# Patient Record
Sex: Male | Born: 1945
Health system: Southern US, Community
[De-identification: ages and names within clinical notes are randomized; demographics above are authoritative.]

## PROBLEM LIST (undated history)

## (undated) DIAGNOSIS — I10 Essential (primary) hypertension: Secondary | ICD-10-CM

## (undated) DIAGNOSIS — R011 Cardiac murmur, unspecified: Secondary | ICD-10-CM

## (undated) DIAGNOSIS — N052 Unspecified nephritic syndrome with diffuse membranous glomerulonephritis: Secondary | ICD-10-CM

## (undated) DIAGNOSIS — E669 Obesity, unspecified: Secondary | ICD-10-CM

## (undated) DIAGNOSIS — C801 Malignant (primary) neoplasm, unspecified: Secondary | ICD-10-CM

## (undated) DIAGNOSIS — Z8719 Personal history of other diseases of the digestive system: Secondary | ICD-10-CM

## (undated) DIAGNOSIS — C884 Extranodal marginal zone B-cell lymphoma of mucosa-associated lymphoid tissue [MALT-lymphoma]: Secondary | ICD-10-CM

## (undated) DIAGNOSIS — N189 Chronic kidney disease, unspecified: Secondary | ICD-10-CM

## (undated) DIAGNOSIS — G7000 Myasthenia gravis without (acute) exacerbation: Secondary | ICD-10-CM

## (undated) DIAGNOSIS — E785 Hyperlipidemia, unspecified: Secondary | ICD-10-CM

## (undated) DIAGNOSIS — Z87442 Personal history of urinary calculi: Secondary | ICD-10-CM

## (undated) HISTORY — DX: Chronic kidney disease, unspecified: N18.9

## (undated) HISTORY — DX: Cardiac murmur, unspecified: R01.1

## (undated) HISTORY — DX: Obesity, unspecified: E66.9

## (undated) HISTORY — DX: Myasthenia gravis without (acute) exacerbation: G70.00

## (undated) HISTORY — DX: Unspecified nephritic syndrome with diffuse membranous glomerulonephritis: N05.2

## (undated) HISTORY — DX: Hyperlipidemia, unspecified: E78.5

## (undated) HISTORY — DX: Extranodal marginal zone B-cell lymphoma of mucosa-associated lymphoid tissue (MALT-lymphoma): C88.4

## (undated) HISTORY — DX: Malignant (primary) neoplasm, unspecified: C80.1

## (undated) HISTORY — DX: Personal history of other diseases of the digestive system: Z87.19

## (undated) HISTORY — PX: LITHOTRIPSY: SUR834

## (undated) HISTORY — DX: Essential (primary) hypertension: I10

## (undated) HISTORY — PX: EYE SURGERY: SHX253

## (undated) HISTORY — PX: OTHER SURGICAL HISTORY: SHX169

---

## 1989-06-24 DIAGNOSIS — C801 Malignant (primary) neoplasm, unspecified: Secondary | ICD-10-CM

## 1989-06-24 HISTORY — DX: Malignant (primary) neoplasm, unspecified: C80.1

## 2004-03-29 ENCOUNTER — Ambulatory Visit: Payer: Self-pay | Admitting: Internal Medicine

## 2005-07-22 ENCOUNTER — Ambulatory Visit: Payer: Self-pay | Admitting: Urology

## 2005-11-22 ENCOUNTER — Emergency Department: Payer: Self-pay | Admitting: Emergency Medicine

## 2008-11-10 ENCOUNTER — Ambulatory Visit: Payer: Self-pay | Admitting: Urology

## 2009-08-16 ENCOUNTER — Ambulatory Visit: Payer: Self-pay | Admitting: Urology

## 2009-08-17 ENCOUNTER — Ambulatory Visit: Payer: Self-pay | Admitting: Urology

## 2009-12-05 ENCOUNTER — Ambulatory Visit: Payer: Self-pay

## 2013-01-27 ENCOUNTER — Ambulatory Visit: Payer: Self-pay | Admitting: Nephrology

## 2013-07-12 ENCOUNTER — Ambulatory Visit: Payer: Self-pay | Admitting: Nephrology

## 2013-07-12 LAB — URINALYSIS, COMPLETE
Bacteria: NONE SEEN
Bilirubin,UR: NEGATIVE
Blood: NEGATIVE
Glucose,UR: 50 mg/dL (ref 0–75)
Granular Cast: 1
Ketone: NEGATIVE
Leukocyte Esterase: NEGATIVE
Nitrite: NEGATIVE
Ph: 6 (ref 4.5–8.0)
Protein: 30
RBC,UR: 1 /HPF (ref 0–5)
Specific Gravity: 1.019 (ref 1.003–1.030)
Squamous Epithelial: <1
WBC UR: <1 /HPF (ref 0–5)

## 2013-07-12 LAB — COMPREHENSIVE METABOLIC PANEL
Albumin: 3.6 g/dL (ref 3.4–5.0)
Alkaline Phosphatase: 83 U/L
Anion Gap: 5 — ABNORMAL LOW (ref 7–16)
BUN: 20 mg/dL — ABNORMAL HIGH (ref 7–18)
Bilirubin,Total: 0.4 mg/dL (ref 0.2–1.0)
Calcium, Total: 9.2 mg/dL (ref 8.5–10.1)
Chloride: 105 mmol/L (ref 98–107)
Co2: 27 mmol/L (ref 21–32)
Creatinine: 1.98 mg/dL — ABNORMAL HIGH (ref 0.60–1.30)
EGFR (African American): 39 — ABNORMAL LOW
EGFR (Non-African Amer.): 33 — ABNORMAL LOW
Glucose: 100 mg/dL — ABNORMAL HIGH (ref 65–99)
Osmolality: 277 (ref 275–301)
Potassium: 4 mmol/L (ref 3.5–5.1)
SGOT(AST): 28 U/L (ref 15–37)
SGPT (ALT): 36 U/L (ref 12–78)
Sodium: 137 mmol/L (ref 136–145)
Total Protein: 7.6 g/dL (ref 6.4–8.2)

## 2013-07-12 LAB — CBC WITH DIFFERENTIAL/PLATELET
Basophil #: 0 10*3/uL (ref 0.0–0.1)
Basophil %: 0.7 %
Eosinophil #: 0.4 10*3/uL (ref 0.0–0.7)
Eosinophil %: 5.7 %
HCT: 47.3 % (ref 40.0–52.0)
HGB: 16.2 g/dL (ref 13.0–18.0)
Lymphocyte #: 1.3 10*3/uL (ref 1.0–3.6)
Lymphocyte %: 17.8 %
MCH: 31.6 pg (ref 26.0–34.0)
MCHC: 34.2 g/dL (ref 32.0–36.0)
MCV: 92 fL (ref 80–100)
Monocyte #: 0.6 x10 3/mm (ref 0.2–1.0)
Monocyte %: 9.1 %
Neutrophil #: 4.7 10*3/uL (ref 1.4–6.5)
Neutrophil %: 66.7 %
Platelet: 209 10*3/uL (ref 150–440)
RBC: 5.12 10*6/uL (ref 4.40–5.90)
RDW: 14.1 % (ref 11.5–14.5)
WBC: 7.1 10*3/uL (ref 3.8–10.6)

## 2013-07-12 LAB — PROTIME-INR
INR: 1
Prothrombin Time: 13 secs (ref 11.5–14.7)

## 2013-07-12 LAB — PROTEIN / CREATININE RATIO, URINE
Creatinine, Urine: 161 mg/dL — ABNORMAL HIGH (ref 30.0–125.0)
Protein, Random Urine: 86 mg/dL — ABNORMAL HIGH (ref 0–12)
Protein/Creat. Ratio: 534 mg/gCREAT — ABNORMAL HIGH (ref 0–200)

## 2013-07-12 LAB — APTT: Activated PTT: 38.3 secs — ABNORMAL HIGH (ref 23.6–35.9)

## 2013-07-21 ENCOUNTER — Observation Stay: Payer: Self-pay | Admitting: Nephrology

## 2013-07-21 LAB — CBC WITH DIFFERENTIAL/PLATELET
Basophil #: 0.1 10*3/uL (ref 0.0–0.1)
Basophil %: 0.7 %
Eosinophil #: 0.2 10*3/uL (ref 0.0–0.7)
Eosinophil %: 2.4 %
HCT: 45.9 % (ref 40.0–52.0)
HGB: 15.9 g/dL (ref 13.0–18.0)
Lymphocyte #: 1.5 10*3/uL (ref 1.0–3.6)
Lymphocyte %: 17.3 %
MCH: 31.8 pg (ref 26.0–34.0)
MCHC: 34.6 g/dL (ref 32.0–36.0)
MCV: 92 fL (ref 80–100)
Monocyte #: 0.9 x10 3/mm (ref 0.2–1.0)
Monocyte %: 9.9 %
Neutrophil #: 6 10*3/uL (ref 1.4–6.5)
Neutrophil %: 69.7 %
Platelet: 192 10*3/uL (ref 150–440)
RBC: 4.99 10*6/uL (ref 4.40–5.90)
RDW: 14.1 % (ref 11.5–14.5)
WBC: 8.6 10*3/uL (ref 3.8–10.6)

## 2013-07-21 LAB — COMPREHENSIVE METABOLIC PANEL
Albumin: 3.5 g/dL (ref 3.4–5.0)
Alkaline Phosphatase: 86 U/L
Anion Gap: 7 (ref 7–16)
BUN: 21 mg/dL — ABNORMAL HIGH (ref 7–18)
Bilirubin,Total: 0.3 mg/dL (ref 0.2–1.0)
Calcium, Total: 9 mg/dL (ref 8.5–10.1)
Chloride: 107 mmol/L (ref 98–107)
Co2: 24 mmol/L (ref 21–32)
Creatinine: 1.95 mg/dL — ABNORMAL HIGH (ref 0.60–1.30)
EGFR (African American): 40 — ABNORMAL LOW
EGFR (Non-African Amer.): 34 — ABNORMAL LOW
Glucose: 122 mg/dL — ABNORMAL HIGH (ref 65–99)
Osmolality: 280 (ref 275–301)
Potassium: 4 mmol/L (ref 3.5–5.1)
SGOT(AST): 27 U/L (ref 15–37)
SGPT (ALT): 36 U/L (ref 12–78)
Sodium: 138 mmol/L (ref 136–145)
Total Protein: 7 g/dL (ref 6.4–8.2)

## 2013-07-21 LAB — URINALYSIS, COMPLETE
Bacteria: NONE SEEN
Bilirubin,UR: NEGATIVE
Blood: NEGATIVE
Glucose,UR: 50 mg/dL (ref 0–75)
Ketone: NEGATIVE
Leukocyte Esterase: NEGATIVE
Nitrite: NEGATIVE
Ph: 6 (ref 4.5–8.0)
Protein: 30
RBC,UR: NONE SEEN /HPF (ref 0–5)
Specific Gravity: 1.014 (ref 1.003–1.030)
Squamous Epithelial: <1
WBC UR: <1 /HPF (ref 0–5)

## 2013-07-21 LAB — PROTEIN / CREATININE RATIO, URINE
Creatinine, Urine: 128.8 mg/dL — ABNORMAL HIGH (ref 30.0–125.0)
Protein, Random Urine: 59 mg/dL — ABNORMAL HIGH (ref 0–12)
Protein/Creat. Ratio: 458 mg/gCREAT — ABNORMAL HIGH (ref 0–200)

## 2013-07-21 LAB — PROTIME-INR
INR: 1.1
Prothrombin Time: 13.7 secs (ref 11.5–14.7)

## 2013-07-21 LAB — HEMOGLOBIN: HGB: 16.3 g/dL (ref 13.0–18.0)

## 2013-07-22 LAB — CBC WITH DIFFERENTIAL/PLATELET
Basophil #: 0 10*3/uL (ref 0.0–0.1)
Basophil %: 0.4 %
Eosinophil #: 0.2 10*3/uL (ref 0.0–0.7)
Eosinophil %: 2.4 %
HCT: 46.8 % (ref 40.0–52.0)
HGB: 15.9 g/dL (ref 13.0–18.0)
Lymphocyte #: 1.5 10*3/uL (ref 1.0–3.6)
Lymphocyte %: 17.1 %
MCH: 31.6 pg (ref 26.0–34.0)
MCHC: 34 g/dL (ref 32.0–36.0)
MCV: 93 fL (ref 80–100)
Monocyte #: 0.9 x10 3/mm (ref 0.2–1.0)
Monocyte %: 10.6 %
Neutrophil #: 6.2 10*3/uL (ref 1.4–6.5)
Neutrophil %: 69.5 %
Platelet: 196 10*3/uL (ref 150–440)
RBC: 5.03 10*6/uL (ref 4.40–5.90)
RDW: 13.9 % (ref 11.5–14.5)
WBC: 8.9 10*3/uL (ref 3.8–10.6)

## 2013-07-22 LAB — BASIC METABOLIC PANEL
Anion Gap: 7 (ref 7–16)
BUN: 24 mg/dL — ABNORMAL HIGH (ref 7–18)
Calcium, Total: 9.2 mg/dL (ref 8.5–10.1)
Chloride: 107 mmol/L (ref 98–107)
Co2: 22 mmol/L (ref 21–32)
Creatinine: 1.94 mg/dL — ABNORMAL HIGH (ref 0.60–1.30)
EGFR (African American): 40 — ABNORMAL LOW
EGFR (Non-African Amer.): 34 — ABNORMAL LOW
Glucose: 105 mg/dL — ABNORMAL HIGH (ref 65–99)
Osmolality: 276 (ref 275–301)
Potassium: 3.7 mmol/L (ref 3.5–5.1)
Sodium: 136 mmol/L (ref 136–145)

## 2013-08-03 ENCOUNTER — Ambulatory Visit: Payer: Self-pay | Admitting: Nephrology

## 2013-08-12 ENCOUNTER — Ambulatory Visit: Payer: Self-pay | Admitting: Internal Medicine

## 2013-08-12 LAB — HEPATIC FUNCTION PANEL A (ARMC)
Albumin: 3.8 g/dL (ref 3.4–5.0)
Alkaline Phosphatase: 77 U/L
Bilirubin, Direct: 0.1 mg/dL (ref 0.00–0.20)
Bilirubin,Total: 0.3 mg/dL (ref 0.2–1.0)
SGOT(AST): 23 U/L (ref 15–37)
SGPT (ALT): 40 U/L (ref 12–78)
Total Protein: 7.5 g/dL (ref 6.4–8.2)

## 2013-08-12 LAB — LACTATE DEHYDROGENASE: LDH: 204 U/L (ref 85–241)

## 2013-08-13 LAB — CANCER ANTIGEN 19-9: CA 19-9: 2 U/mL (ref 0–35)

## 2013-08-13 LAB — CEA: CEA: 2.6 ng/mL (ref 0.0–4.7)

## 2013-08-22 ENCOUNTER — Ambulatory Visit: Payer: Self-pay | Admitting: Internal Medicine

## 2013-09-22 ENCOUNTER — Ambulatory Visit: Payer: Self-pay | Admitting: Internal Medicine

## 2013-10-22 ENCOUNTER — Ambulatory Visit: Payer: Self-pay | Admitting: Internal Medicine

## 2013-10-29 ENCOUNTER — Ambulatory Visit: Payer: Self-pay | Admitting: Internal Medicine

## 2013-11-01 ENCOUNTER — Ambulatory Visit: Payer: Self-pay | Admitting: Internal Medicine

## 2013-11-01 LAB — CBC CANCER CENTER
Basophil #: 0.1 x10 3/mm (ref 0.0–0.1)
Basophil %: 0.9 %
Eosinophil #: 0.5 x10 3/mm (ref 0.0–0.7)
Eosinophil %: 6.2 %
HCT: 42.2 % (ref 40.0–52.0)
HGB: 14.3 g/dL (ref 13.0–18.0)
Lymphocyte #: 1.6 x10 3/mm (ref 1.0–3.6)
Lymphocyte %: 20.9 %
MCH: 31.5 pg (ref 26.0–34.0)
MCHC: 33.8 g/dL (ref 32.0–36.0)
MCV: 93 fL (ref 80–100)
Monocyte #: 0.8 x10 3/mm (ref 0.2–1.0)
Monocyte %: 10.2 %
Neutrophil #: 4.7 x10 3/mm (ref 1.4–6.5)
Neutrophil %: 61.8 %
Platelet: 184 x10 3/mm (ref 150–440)
RBC: 4.53 10*6/uL (ref 4.40–5.90)
RDW: 13.8 % (ref 11.5–14.5)
WBC: 7.7 x10 3/mm (ref 3.8–10.6)

## 2013-11-22 ENCOUNTER — Ambulatory Visit: Payer: Self-pay | Admitting: Internal Medicine

## 2014-01-11 ENCOUNTER — Ambulatory Visit: Payer: Self-pay | Admitting: Gastroenterology

## 2014-01-12 LAB — PATHOLOGY REPORT

## 2014-02-10 ENCOUNTER — Ambulatory Visit: Payer: Self-pay | Admitting: Internal Medicine

## 2014-06-24 DIAGNOSIS — Z8719 Personal history of other diseases of the digestive system: Secondary | ICD-10-CM

## 2014-06-24 HISTORY — DX: Personal history of other diseases of the digestive system: Z87.19

## 2014-07-05 ENCOUNTER — Emergency Department: Payer: Self-pay | Admitting: Emergency Medicine

## 2014-07-05 LAB — CBC WITH DIFFERENTIAL/PLATELET
Basophil #: 0.1 10*3/uL (ref 0.0–0.1)
Basophil %: 0.6 %
Eosinophil #: 0.3 10*3/uL (ref 0.0–0.7)
Eosinophil %: 2.8 %
HCT: 48.9 % (ref 40.0–52.0)
HGB: 16.1 g/dL (ref 13.0–18.0)
Lymphocyte #: 1.9 10*3/uL (ref 1.0–3.6)
Lymphocyte %: 17.3 %
MCH: 32.2 pg (ref 26.0–34.0)
MCHC: 32.9 g/dL (ref 32.0–36.0)
MCV: 98 fL (ref 80–100)
Monocyte #: 1.3 x10 3/mm — ABNORMAL HIGH (ref 0.2–1.0)
Monocyte %: 11.9 %
Neutrophil #: 7.2 10*3/uL — ABNORMAL HIGH (ref 1.4–6.5)
Neutrophil %: 67.4 %
Platelet: 201 10*3/uL (ref 150–440)
RBC: 4.99 10*6/uL (ref 4.40–5.90)
RDW: 13.3 % (ref 11.5–14.5)
WBC: 10.7 10*3/uL — ABNORMAL HIGH (ref 3.8–10.6)

## 2014-07-05 LAB — URINALYSIS, COMPLETE
Bacteria: NONE SEEN
Bilirubin,UR: NEGATIVE
Blood: NEGATIVE
Glucose,UR: 50 mg/dL (ref 0–75)
Ketone: NEGATIVE
Leukocyte Esterase: NEGATIVE
Nitrite: NEGATIVE
Ph: 5 (ref 4.5–8.0)
Protein: NEGATIVE
RBC,UR: NONE SEEN /HPF (ref 0–5)
Specific Gravity: 1.018 (ref 1.003–1.030)
Squamous Epithelial: NONE SEEN
WBC UR: 1 /HPF (ref 0–5)

## 2014-07-05 LAB — COMPREHENSIVE METABOLIC PANEL
Albumin: 3.5 g/dL (ref 3.4–5.0)
Alkaline Phosphatase: 66 U/L
Anion Gap: 9 (ref 7–16)
BUN: 26 mg/dL — ABNORMAL HIGH (ref 7–18)
Bilirubin,Total: 0.5 mg/dL (ref 0.2–1.0)
Calcium, Total: 9.1 mg/dL (ref 8.5–10.1)
Chloride: 107 mmol/L (ref 98–107)
Co2: 25 mmol/L (ref 21–32)
Creatinine: 2.15 mg/dL — ABNORMAL HIGH (ref 0.60–1.30)
EGFR (African American): 39 — ABNORMAL LOW
EGFR (Non-African Amer.): 32 — ABNORMAL LOW
Glucose: 112 mg/dL — ABNORMAL HIGH (ref 65–99)
Osmolality: 287 (ref 275–301)
Potassium: 3.9 mmol/L (ref 3.5–5.1)
SGOT(AST): 22 U/L (ref 15–37)
SGPT (ALT): 35 U/L
Sodium: 141 mmol/L (ref 136–145)
Total Protein: 6.9 g/dL (ref 6.4–8.2)

## 2014-08-19 ENCOUNTER — Ambulatory Visit: Payer: Self-pay | Admitting: Family Medicine

## 2014-12-06 ENCOUNTER — Telehealth: Payer: Self-pay | Admitting: Family Medicine

## 2014-12-06 MED ORDER — SCOPOLAMINE 1 MG/3DAYS TD PT72: 2.0000 | MEDICATED_PATCH | TRANSDERMAL | Status: DC

## 2014-12-06 NOTE — Telephone Encounter (Signed)
Pt called stated he is leaving Friday for a cruise to [place]. Wants to know if Dr. Jeananne Rama can call in 2 motion sickness patches that go behind the ear. Please call pt with any issues. 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

## 2015-05-11 DIAGNOSIS — I10 Essential (primary) hypertension: Secondary | ICD-10-CM | POA: Insufficient documentation

## 2015-05-11 DIAGNOSIS — E785 Hyperlipidemia, unspecified: Secondary | ICD-10-CM | POA: Insufficient documentation

## 2015-05-15 ENCOUNTER — Encounter: Payer: Self-pay | Admitting: Family Medicine

## 2015-05-15 ENCOUNTER — Ambulatory Visit (INDEPENDENT_AMBULATORY_CARE_PROVIDER_SITE_OTHER): Payer: PPO | Admitting: Family Medicine

## 2015-05-15 VITALS — BP 131/83 | HR 88 | Temp 98.1°F | Ht 68.2 in | Wt 218.0 lb

## 2015-05-15 DIAGNOSIS — Z0000 Encounter for general adult medical examination without abnormal findings: Secondary | ICD-10-CM

## 2015-05-15 DIAGNOSIS — E039 Hypothyroidism, unspecified: Secondary | ICD-10-CM | POA: Diagnosis not present

## 2015-05-15 DIAGNOSIS — N052 Unspecified nephritic syndrome with diffuse membranous glomerulonephritis: Secondary | ICD-10-CM | POA: Diagnosis not present

## 2015-05-15 DIAGNOSIS — I10 Essential (primary) hypertension: Secondary | ICD-10-CM

## 2015-05-15 DIAGNOSIS — G7000 Myasthenia gravis without (acute) exacerbation: Secondary | ICD-10-CM

## 2015-05-15 DIAGNOSIS — E785 Hyperlipidemia, unspecified: Secondary | ICD-10-CM | POA: Diagnosis not present

## 2015-05-15 DIAGNOSIS — N184 Chronic kidney disease, stage 4 (severe): Secondary | ICD-10-CM | POA: Diagnosis not present

## 2015-05-15 MED ORDER — OMEPRAZOLE MAGNESIUM 20 MG PO TBEC: 20.0000 mg | DELAYED_RELEASE_TABLET | Freq: Every day | ORAL | Status: DC

## 2015-05-15 MED ORDER — ATORVASTATIN CALCIUM 20 MG PO TABS: 20.0000 mg | ORAL_TABLET | Freq: Every day | ORAL | Status: DC

## 2015-05-15 MED ORDER — BENAZEPRIL HCL 40 MG PO TABS: 40.0000 mg | ORAL_TABLET | Freq: Every day | ORAL | Status: DC

## 2015-05-15 MED ORDER — LEVOTHYROXINE SODIUM 175 MCG PO TABS: 175.0000 ug | ORAL_TABLET | Freq: Every day | ORAL | Status: DC

## 2015-05-15 NOTE — Assessment & Plan Note (Signed)
Cared for at the [facility]

## 2015-05-15 NOTE — Assessment & Plan Note (Signed)
The current medical regimen is effective;  continue present plan and medications.  

## 2015-05-15 NOTE — Progress Notes (Signed)
BP 131/83 mmHg  Pulse 88  Temp(Src) 98.1 F (36.7 C)  Ht 5' 8.2" (1.732 m)  Wt 218 lb (98.884 kg)  BMI 32.96 kg/m2   Subjective:    Patient ID: Bill Aguilar, male    DOB: 02-14-1946, 69 y.o.   MRN: [id number]  HPI: Bill Aguilar is a 69 y.o. male  Chief Complaint  Patient presents with  . Annual Exam   4 annual wellness visit also follow-up blood pressure and renal protection from benazepril takes faithfully without problems or side effects Takes Lipitor without problems or side effects takes faithfully Thyroid doing well with no concerns takes medicines faithfully  myasthenia stable on Mestinon Takes prednisone for glomerulonephritis and stable following with nephrology on a regular basis  Patient being chronic prednisone usage concerned about osteoporosis Will check bone density   Relevant past medical, surgical, family and social history reviewed and updated as indicated. Interim medical history since our last visit reviewed. Allergies and medications reviewed and updated.  Review of Systems  Constitutional: Negative.   HENT: Negative.   Eyes: Negative.   Respiratory: Negative.   Cardiovascular: Negative.   Gastrointestinal: Negative.   Endocrine: Negative.   Genitourinary: Negative.   Musculoskeletal: Negative.   Skin: Negative.   Allergic/Immunologic: Negative.   Neurological: Negative.   Hematological: Negative.   Psychiatric/Behavioral: Negative.     Per HPI unless specifically indicated above     Objective:    BP 131/83 mmHg  Pulse 88  Temp(Src) 98.1 F (36.7 C)  Ht 5' 8.2" (1.732 m)  Wt 218 lb (98.884 kg)  BMI 32.96 kg/m2  Wt Readings from Last 3 Encounters:  05/15/15 218 lb (98.884 kg)  10/26/14 221 lb (100.245 kg)    Physical Exam  Constitutional: He is oriented to person, place, and time. He appears well-developed and well-nourished.  HENT:  Head: Normocephalic and atraumatic.  Right Ear: External ear normal.  Left  Ear: External ear normal.  Eyes: Conjunctivae and EOM are normal. Pupils are equal, round, and reactive to light.  Neck: Normal range of motion. Neck supple.  Cardiovascular: Normal rate, regular rhythm, normal heart sounds and intact distal pulses.   Pulmonary/Chest: Effort normal and breath sounds normal.  Abdominal: Soft. Bowel sounds are normal. There is no splenomegaly or hepatomegaly.  Genitourinary: Rectum normal, prostate normal and penis normal.  Musculoskeletal: Normal range of motion.  Neurological: He is alert and oriented to person, place, and time. He has normal reflexes.  Skin: No rash noted. No erythema.  Psychiatric: He has a normal mood and affect. His behavior is normal. Judgment and thought content normal.    Results for orders placed or performed in visit on 07/05/14  CBC with Differential/Platelet  Result Value Ref Range   WBC 10.7 (H) 3.8-10.6 x10 3/mm 3   RBC 4.99 4.40-5.90 x10 6/mm 3   HGB 16.1 13.0-18.0 g/dL   HCT 48.9 40.0-52.0 %   MCV 98 80-100 fL   MCH 32.2 26.0-34.0 pg   MCHC 32.9 32.0-36.0 g/dL   RDW 13.3 11.5-14.5 %   Platelet 201 150-440 x10 3/mm 3   Neutrophil % 67.4 %   Lymphocyte % 17.3 %   Monocyte % 11.9 %   Eosinophil % 2.8 %   Basophil % 0.6 %   Neutrophil # 7.2 (H) 1.4-6.5 x10 3/mm 3   Lymphocyte # 1.9 1.0-3.6 x10 3/mm 3   Monocyte # 1.3 (H) 0.2-1.0 x10 3/mm    Eosinophil # 0.3 0.0-0.7 x10  3/mm 3   Basophil # 0.1 0.0-0.1 x10 3/mm 3  Comprehensive metabolic panel  Result Value Ref Range   Glucose 112 (H) 65-99 mg/dL   BUN 26 (H) 7-18 mg/dL   Creatinine 2.15 (H) 0.60-1.30 mg/dL   Sodium 141 136-145 mmol/L   Potassium 3.9 3.5-5.1 mmol/L   Chloride 107 98-107 mmol/L   Co2 25 21-32 mmol/L   Calcium, Total 9.1 8.5-10.1 mg/dL   SGOT(AST) 22 15-37 Unit/L   SGPT (ALT) 35 U/L   Alkaline Phosphatase 66 Unit/L   Albumin 3.5 3.4-5.0 g/dL   Total Protein 6.9 6.4-8.2 g/dL   Bilirubin,Total 0.5 0.2-1.0 mg/dL   Osmolality 287 275-301   Anion  Gap 9 7-16   EGFR (African American) 39 (L) >6m/min   EGFR (Non-African Amer.) 32 (L) >673mmin  Urinalysis, Complete  Result Value Ref Range   Color - urine Yellow    Clarity - urine Clear    Glucose,UR 50 mg/dL 0-75 mg/dL   Bilirubin,UR Negative NEGATIVE   Ketone Negative NEGATIVE   Specific Gravity 1.018 1.003-1.030   Blood Negative NEGATIVE   Ph 5.0 4.5-8.0   Protein Negative NEGATIVE   Nitrite Negative NEGATIVE   Leukocyte Esterase Negative NEGATIVE   RBC,UR NONE SEEN 0-5 /HPF   WBC UR 1 /HPF 0-5 /HPF   Bacteria NONE SEEN NONE SEEN   Squamous Epithelial NONE SEEN    Mucous PRESENT       Assessment & Plan:   Problem List Items Addressed This Visit      Cardiovascular and Mediastinum   Essential hypertension    The current medical regimen is effective;  continue present plan and medications.       Relevant Medications   benazepril (LOTENSIN) 40 MG tablet   atorvastatin (LIPITOR) 20 MG tablet   Other Relevant Orders   Comprehensive metabolic panel   PSA     Endocrine   Hypothyroidism    The current medical regimen is effective;  continue present plan and medications.       Relevant Medications   levothyroxine (SYNTHROID, LEVOTHROID) 175 MCG tablet   Other Relevant Orders   TSH     Musculoskeletal and Integument   Myasthenia gravis (HCChamizal   [facility]    Relevant Orders   Comprehensive metabolic panel   PSA     Genitourinary   Membranous glomerulonephritis, stage 4   Relevant Orders   PSA   TSH   Membranous glomerulonephritis - Primary    Followed by nephrology      Chronic kidney disease   Relevant Orders   DG Bone Density   Comprehensive metabolic panel   CBC with Differential/Platelet   PSA     Other   Hyperlipidemia    The current medical regimen is effective;  continue present plan and medications.       Relevant Medications   benazepril (LOTENSIN) 40 MG tablet   atorvastatin (LIPITOR) 20 MG tablet    Other Relevant Orders   Lipid panel    Other Visit Diagnoses    PE (physical exam), annual            Follow up plan: Return in about 6 months (around 11/12/2015) for lipids, alt,ast.

## 2015-05-15 NOTE — Assessment & Plan Note (Signed)
Followed by nephrology. 

## 2015-05-16 ENCOUNTER — Encounter: Payer: Self-pay | Admitting: Family Medicine

## 2015-05-16 LAB — LIPID PANEL
Chol/HDL Ratio: 4.2 ratio units (ref 0.0–5.0)
Cholesterol, Total: 197 mg/dL (ref 100–199)
HDL: 47 mg/dL (ref >39)
LDL Calculated: 96 mg/dL (ref 0–99)
Triglycerides: 269 mg/dL — ABNORMAL HIGH (ref 0–149)
VLDL Cholesterol Cal: 54 mg/dL — ABNORMAL HIGH (ref 5–40)

## 2015-05-16 LAB — CBC WITH DIFFERENTIAL/PLATELET
Basophils Absolute: 0 10*3/uL (ref 0.0–0.2)
Basos: 0 %
EOS (ABSOLUTE): 0.2 10*3/uL (ref 0.0–0.4)
Eos: 3 %
Hematocrit: 45.9 % (ref 37.5–51.0)
Hemoglobin: 16 g/dL (ref 12.6–17.7)
Immature Grans (Abs): 0.2 10*3/uL — ABNORMAL HIGH (ref 0.0–0.1)
Immature Granulocytes: 3 %
Lymphocytes Absolute: 1.6 10*3/uL (ref 0.7–3.1)
Lymphs: 21 %
MCH: 33.1 pg — ABNORMAL HIGH (ref 26.6–33.0)
MCHC: 34.9 g/dL (ref 31.5–35.7)
MCV: 95 fL (ref 79–97)
Monocytes Absolute: 1.1 10*3/uL — ABNORMAL HIGH (ref 0.1–0.9)
Monocytes: 14 %
Neutrophils Absolute: 4.5 10*3/uL (ref 1.4–7.0)
Neutrophils: 59 %
Platelets: 188 10*3/uL (ref 150–379)
RBC: 4.84 x10E6/uL (ref 4.14–5.80)
RDW: 14 % (ref 12.3–15.4)
WBC: 7.7 10*3/uL (ref 3.4–10.8)

## 2015-05-16 LAB — COMPREHENSIVE METABOLIC PANEL
ALT: 31 IU/L (ref 0–44)
AST: 21 IU/L (ref 0–40)
Albumin/Globulin Ratio: 1.8 (ref 1.1–2.5)
Albumin: 4.2 g/dL (ref 3.5–4.8)
Alkaline Phosphatase: 75 IU/L (ref 39–117)
BUN/Creatinine Ratio: 13 (ref 10–22)
BUN: 25 mg/dL (ref 8–27)
Bilirubin Total: 0.3 mg/dL (ref 0.0–1.2)
CO2: 25 mmol/L (ref 18–29)
Calcium: 9.6 mg/dL (ref 8.6–10.2)
Chloride: 101 mmol/L (ref 97–106)
Creatinine, Ser: 1.95 mg/dL — ABNORMAL HIGH (ref 0.76–1.27)
GFR calc Af Amer: 39 mL/min/{1.73_m2} — ABNORMAL LOW (ref >59)
GFR calc non Af Amer: 34 mL/min/{1.73_m2} — ABNORMAL LOW (ref >59)
Globulin, Total: 2.4 g/dL (ref 1.5–4.5)
Glucose: 108 mg/dL — ABNORMAL HIGH (ref 65–99)
Potassium: 4.6 mmol/L (ref 3.5–5.2)
Sodium: 141 mmol/L (ref 136–144)
Total Protein: 6.6 g/dL (ref 6.0–8.5)

## 2015-05-16 LAB — PSA: Prostate Specific Ag, Serum: 3.4 ng/mL (ref 0.0–4.0)

## 2015-05-16 LAB — TSH: TSH: 3.14 u[IU]/mL (ref 0.450–4.500)

## 2015-07-21 DIAGNOSIS — I10 Essential (primary) hypertension: Secondary | ICD-10-CM | POA: Diagnosis not present

## 2015-07-21 DIAGNOSIS — N049 Nephrotic syndrome with unspecified morphologic changes: Secondary | ICD-10-CM | POA: Diagnosis not present

## 2015-07-21 DIAGNOSIS — R809 Proteinuria, unspecified: Secondary | ICD-10-CM | POA: Diagnosis not present

## 2015-07-21 DIAGNOSIS — N050 Unspecified nephritic syndrome with minor glomerular abnormality: Secondary | ICD-10-CM | POA: Diagnosis not present

## 2015-07-21 DIAGNOSIS — N183 Chronic kidney disease, stage 3 (moderate): Secondary | ICD-10-CM | POA: Diagnosis not present

## 2015-07-21 DIAGNOSIS — Z131 Encounter for screening for diabetes mellitus: Secondary | ICD-10-CM | POA: Diagnosis not present

## 2015-07-21 DIAGNOSIS — N052 Unspecified nephritic syndrome with diffuse membranous glomerulonephritis: Secondary | ICD-10-CM | POA: Diagnosis not present

## 2015-07-21 DIAGNOSIS — E785 Hyperlipidemia, unspecified: Secondary | ICD-10-CM | POA: Diagnosis not present

## 2015-07-24 DIAGNOSIS — R809 Proteinuria, unspecified: Secondary | ICD-10-CM | POA: Diagnosis not present

## 2015-07-24 DIAGNOSIS — N052 Unspecified nephritic syndrome with diffuse membranous glomerulonephritis: Secondary | ICD-10-CM | POA: Diagnosis not present

## 2015-07-24 DIAGNOSIS — N183 Chronic kidney disease, stage 3 (moderate): Secondary | ICD-10-CM | POA: Diagnosis not present

## 2015-07-24 DIAGNOSIS — I129 Hypertensive chronic kidney disease with stage 1 through stage 4 chronic kidney disease, or unspecified chronic kidney disease: Secondary | ICD-10-CM | POA: Diagnosis not present

## 2015-09-08 DIAGNOSIS — N183 Chronic kidney disease, stage 3 (moderate): Secondary | ICD-10-CM | POA: Diagnosis not present

## 2015-09-08 DIAGNOSIS — N052 Unspecified nephritic syndrome with diffuse membranous glomerulonephritis: Secondary | ICD-10-CM | POA: Diagnosis not present

## 2015-09-08 DIAGNOSIS — N050 Unspecified nephritic syndrome with minor glomerular abnormality: Secondary | ICD-10-CM | POA: Diagnosis not present

## 2015-09-08 DIAGNOSIS — N049 Nephrotic syndrome with unspecified morphologic changes: Secondary | ICD-10-CM | POA: Diagnosis not present

## 2015-09-08 DIAGNOSIS — R809 Proteinuria, unspecified: Secondary | ICD-10-CM | POA: Diagnosis not present

## 2015-09-08 DIAGNOSIS — E785 Hyperlipidemia, unspecified: Secondary | ICD-10-CM | POA: Diagnosis not present

## 2015-09-08 DIAGNOSIS — I10 Essential (primary) hypertension: Secondary | ICD-10-CM | POA: Diagnosis not present

## 2015-09-11 DIAGNOSIS — N052 Unspecified nephritic syndrome with diffuse membranous glomerulonephritis: Secondary | ICD-10-CM | POA: Diagnosis not present

## 2015-09-11 DIAGNOSIS — R809 Proteinuria, unspecified: Secondary | ICD-10-CM | POA: Diagnosis not present

## 2015-09-11 DIAGNOSIS — I10 Essential (primary) hypertension: Secondary | ICD-10-CM | POA: Diagnosis not present

## 2015-09-11 DIAGNOSIS — N184 Chronic kidney disease, stage 4 (severe): Secondary | ICD-10-CM | POA: Diagnosis not present

## 2015-10-03 DIAGNOSIS — N052 Unspecified nephritic syndrome with diffuse membranous glomerulonephritis: Secondary | ICD-10-CM | POA: Diagnosis not present

## 2015-10-03 DIAGNOSIS — N183 Chronic kidney disease, stage 3 (moderate): Secondary | ICD-10-CM | POA: Diagnosis not present

## 2015-10-03 DIAGNOSIS — N049 Nephrotic syndrome with unspecified morphologic changes: Secondary | ICD-10-CM | POA: Diagnosis not present

## 2015-10-03 DIAGNOSIS — R809 Proteinuria, unspecified: Secondary | ICD-10-CM | POA: Diagnosis not present

## 2015-10-03 DIAGNOSIS — I10 Essential (primary) hypertension: Secondary | ICD-10-CM | POA: Diagnosis not present

## 2015-10-03 DIAGNOSIS — N050 Unspecified nephritic syndrome with minor glomerular abnormality: Secondary | ICD-10-CM | POA: Diagnosis not present

## 2015-10-03 DIAGNOSIS — E785 Hyperlipidemia, unspecified: Secondary | ICD-10-CM | POA: Diagnosis not present

## 2015-10-05 DIAGNOSIS — R809 Proteinuria, unspecified: Secondary | ICD-10-CM | POA: Diagnosis not present

## 2015-10-05 DIAGNOSIS — N052 Unspecified nephritic syndrome with diffuse membranous glomerulonephritis: Secondary | ICD-10-CM | POA: Diagnosis not present

## 2015-10-05 DIAGNOSIS — N183 Chronic kidney disease, stage 3 (moderate): Secondary | ICD-10-CM | POA: Diagnosis not present

## 2015-10-05 DIAGNOSIS — I10 Essential (primary) hypertension: Secondary | ICD-10-CM | POA: Diagnosis not present

## 2015-10-05 DIAGNOSIS — R319 Hematuria, unspecified: Secondary | ICD-10-CM | POA: Diagnosis not present

## 2015-10-07 DIAGNOSIS — Z7952 Long term (current) use of systemic steroids: Secondary | ICD-10-CM | POA: Diagnosis not present

## 2015-10-13 DIAGNOSIS — I10 Essential (primary) hypertension: Secondary | ICD-10-CM | POA: Diagnosis not present

## 2015-10-13 DIAGNOSIS — R809 Proteinuria, unspecified: Secondary | ICD-10-CM | POA: Diagnosis not present

## 2015-10-13 DIAGNOSIS — N052 Unspecified nephritic syndrome with diffuse membranous glomerulonephritis: Secondary | ICD-10-CM | POA: Diagnosis not present

## 2015-10-13 DIAGNOSIS — R319 Hematuria, unspecified: Secondary | ICD-10-CM | POA: Diagnosis not present

## 2015-10-13 DIAGNOSIS — N183 Chronic kidney disease, stage 3 (moderate): Secondary | ICD-10-CM | POA: Diagnosis not present

## 2015-10-23 DIAGNOSIS — E785 Hyperlipidemia, unspecified: Secondary | ICD-10-CM | POA: Diagnosis not present

## 2015-10-23 DIAGNOSIS — N052 Unspecified nephritic syndrome with diffuse membranous glomerulonephritis: Secondary | ICD-10-CM | POA: Diagnosis not present

## 2015-10-23 DIAGNOSIS — R809 Proteinuria, unspecified: Secondary | ICD-10-CM | POA: Diagnosis not present

## 2015-10-23 DIAGNOSIS — I10 Essential (primary) hypertension: Secondary | ICD-10-CM | POA: Diagnosis not present

## 2015-10-23 DIAGNOSIS — N183 Chronic kidney disease, stage 3 (moderate): Secondary | ICD-10-CM | POA: Diagnosis not present

## 2015-10-23 DIAGNOSIS — N049 Nephrotic syndrome with unspecified morphologic changes: Secondary | ICD-10-CM | POA: Diagnosis not present

## 2015-10-23 DIAGNOSIS — N050 Unspecified nephritic syndrome with minor glomerular abnormality: Secondary | ICD-10-CM | POA: Diagnosis not present

## 2015-10-25 DIAGNOSIS — N183 Chronic kidney disease, stage 3 (moderate): Secondary | ICD-10-CM | POA: Diagnosis not present

## 2015-10-25 DIAGNOSIS — E785 Hyperlipidemia, unspecified: Secondary | ICD-10-CM | POA: Diagnosis not present

## 2015-10-25 DIAGNOSIS — R809 Proteinuria, unspecified: Secondary | ICD-10-CM | POA: Diagnosis not present

## 2015-10-25 DIAGNOSIS — N052 Unspecified nephritic syndrome with diffuse membranous glomerulonephritis: Secondary | ICD-10-CM | POA: Diagnosis not present

## 2015-10-25 DIAGNOSIS — I10 Essential (primary) hypertension: Secondary | ICD-10-CM | POA: Diagnosis not present

## 2015-11-13 ENCOUNTER — Encounter: Payer: Self-pay | Admitting: Family Medicine

## 2015-11-13 ENCOUNTER — Ambulatory Visit (INDEPENDENT_AMBULATORY_CARE_PROVIDER_SITE_OTHER): Payer: PPO | Admitting: Family Medicine

## 2015-11-13 VITALS — BP 96/63 | HR 72 | Temp 97.9°F | Ht 68.5 in | Wt 207.0 lb

## 2015-11-13 DIAGNOSIS — E785 Hyperlipidemia, unspecified: Secondary | ICD-10-CM | POA: Diagnosis not present

## 2015-11-13 DIAGNOSIS — I10 Essential (primary) hypertension: Secondary | ICD-10-CM | POA: Diagnosis not present

## 2015-11-13 LAB — LP+ALT+AST PICCOLO, WAIVED
ALT (SGPT) Piccolo, Waived: 32 U/L (ref 10–47)
AST (SGOT) Piccolo, Waived: 35 U/L (ref 11–38)
Chol/HDL Ratio Piccolo,Waive: 4.6 mg/dL
Cholesterol Piccolo, Waived: 182 mg/dL (ref <200)
HDL Chol Piccolo, Waived: 39 mg/dL — ABNORMAL LOW (ref >59)
LDL Chol Calc Piccolo Waived: 71 mg/dL (ref <100)
Triglycerides Piccolo,Waived: 355 mg/dL — ABNORMAL HIGH (ref <150)
VLDL Chol Calc Piccolo,Waive: 71 mg/dL — ABNORMAL HIGH (ref <30)

## 2015-11-13 NOTE — Assessment & Plan Note (Signed)
The current medical regimen is effective;  continue present plan and medications.  

## 2015-11-13 NOTE — Progress Notes (Signed)
BP 96/63 mmHg  Pulse 72  Temp(Src) 97.9 F (36.6 C)  Ht 5' 8.5" (1.74 m)  Wt 207 lb (93.895 kg)  BMI 31.01 kg/m2  SpO2 99%   Subjective:    Patient ID: Bill Aguilar, male    DOB: 1945/10/19, 70 y.o.   MRN: [id number]  HPI: Bill Aguilar is a 70 y.o. male  Chief Complaint  Patient presents with  . Hyperlipidemia  Patient recheck doing well with nephrology is on this new protocol for glomerular nephritis which seems to be improving. Patient also been working hard on weight loss and is about to goal of 200 pounds. Blood pressure remains low and remains on benazepril for renal protection and blood pressure control. No cholesterol issues no complaints from medications doing well.  Relevant past medical, surgical, family and social history reviewed and updated as indicated. Interim medical history since our last visit reviewed. Allergies and medications reviewed and updated.  Review of Systems  Constitutional: Negative.   Respiratory: Negative.   Cardiovascular: Negative.     Per HPI unless specifically indicated above     Objective:    BP 96/63 mmHg  Pulse 72  Temp(Src) 97.9 F (36.6 C)  Ht 5' 8.5" (1.74 m)  Wt 207 lb (93.895 kg)  BMI 31.01 kg/m2  SpO2 99%  Wt Readings from Last 3 Encounters:  11/13/15 207 lb (93.895 kg)  05/15/15 218 lb (98.884 kg)  10/26/14 221 lb (100.245 kg)    Physical Exam  Constitutional: He is oriented to person, place, and time. He appears well-developed and well-nourished. No distress.  HENT:  Head: Normocephalic and atraumatic.  Right Ear: Hearing normal.  Left Ear: Hearing normal.  Nose: Nose normal.  Eyes: Conjunctivae and lids are normal. Right eye exhibits no discharge. Left eye exhibits no discharge. No scleral icterus.  Cardiovascular: Normal rate, regular rhythm and normal heart sounds.   Pulmonary/Chest: Effort normal and breath sounds normal. No respiratory distress.  Musculoskeletal: Normal range of  motion.  Neurological: He is alert and oriented to person, place, and time.  Skin: Skin is intact. No rash noted.  Psychiatric: He has a normal mood and affect. His speech is normal and behavior is normal. Judgment and thought content normal. Cognition and memory are normal.    Results for orders placed or performed in visit on 05/15/15  Comprehensive metabolic panel  Result Value Ref Range   Glucose 108 (H) 65 - 99 mg/dL   BUN 25 8 - 27 mg/dL   Creatinine, Ser 1.95 (H) 0.76 - 1.27 mg/dL   GFR calc non Af Amer 34 (L) >59 mL/min/1.73   GFR calc Af Amer 39 (L) >59 mL/min/1.73   BUN/Creatinine Ratio 13 10 - 22   Sodium 141 136 - 144 mmol/L   Potassium 4.6 3.5 - 5.2 mmol/L   Chloride 101 97 - 106 mmol/L   CO2 25 18 - 29 mmol/L   Calcium 9.6 8.6 - 10.2 mg/dL   Total Protein 6.6 6.0 - 8.5 g/dL   Albumin 4.2 3.5 - 4.8 g/dL   Globulin, Total 2.4 1.5 - 4.5 g/dL   Albumin/Globulin Ratio 1.8 1.1 - 2.5   Bilirubin Total 0.3 0.0 - 1.2 mg/dL   Alkaline Phosphatase 75 39 - 117 IU/L   AST 21 0 - 40 IU/L   ALT 31 0 - 44 IU/L  Lipid panel  Result Value Ref Range   Cholesterol, Total 197 100 - 199 mg/dL   Triglycerides 269 (H) 0 -  149 mg/dL   HDL 47 >39 mg/dL   VLDL Cholesterol Cal 54 (H) 5 - 40 mg/dL   LDL Calculated 96 0 - 99 mg/dL   Chol/HDL Ratio 4.2 0.0 - 5.0 ratio units  CBC with Differential/Platelet  Result Value Ref Range   WBC 7.7 3.4 - 10.8 x10E3/uL   RBC 4.84 4.14 - 5.80 x10E6/uL   Hemoglobin 16.0 12.6 - 17.7 g/dL   Hematocrit 45.9 37.5 - 51.0 %   MCV 95 79 - 97 fL   MCH 33.1 (H) 26.6 - 33.0 pg   MCHC 34.9 31.5 - 35.7 g/dL   RDW 14.0 12.3 - 15.4 %   Platelets 188 150 - 379 x10E3/uL   Neutrophils 59 %   Lymphs 21 %   Monocytes 14 %   Eos 3 %   Basos 0 %   Neutrophils Absolute 4.5 1.4 - 7.0 x10E3/uL   Lymphocytes Absolute 1.6 0.7 - 3.1 x10E3/uL   Monocytes Absolute 1.1 (H) 0.1 - 0.9 x10E3/uL   EOS (ABSOLUTE) 0.2 0.0 - 0.4 x10E3/uL   Basophils Absolute 0.0 0.0 - 0.2  x10E3/uL   Immature Granulocytes 3 %   Immature Grans (Abs) 0.2 (H) 0.0 - 0.1 x10E3/uL  PSA  Result Value Ref Range   Prostate Specific Ag, Serum 3.4 0.0 - 4.0 ng/mL  TSH  Result Value Ref Range   TSH 3.140 0.450 - 4.500 uIU/mL      Assessment & Plan:   Problem List Items Addressed This Visit      Cardiovascular and Mediastinum   Essential hypertension    The current medical regimen is effective;  continue present plan and medications.         Other   Hyperlipidemia - Primary    The current medical regimen is effective;  continue present plan and medications.       Relevant Orders   LP+ALT+AST Piccolo, Waived       Follow up plan: Return in about 6 months (around 05/15/2016) for Physical Exam.

## 2015-11-23 DIAGNOSIS — L239 Allergic contact dermatitis, unspecified cause: Secondary | ICD-10-CM | POA: Diagnosis not present

## 2015-11-24 DIAGNOSIS — E785 Hyperlipidemia, unspecified: Secondary | ICD-10-CM | POA: Diagnosis not present

## 2015-11-24 DIAGNOSIS — I10 Essential (primary) hypertension: Secondary | ICD-10-CM | POA: Diagnosis not present

## 2015-11-24 DIAGNOSIS — N052 Unspecified nephritic syndrome with diffuse membranous glomerulonephritis: Secondary | ICD-10-CM | POA: Diagnosis not present

## 2015-11-24 DIAGNOSIS — N050 Unspecified nephritic syndrome with minor glomerular abnormality: Secondary | ICD-10-CM | POA: Diagnosis not present

## 2015-11-24 DIAGNOSIS — N183 Chronic kidney disease, stage 3 (moderate): Secondary | ICD-10-CM | POA: Diagnosis not present

## 2015-11-24 DIAGNOSIS — R809 Proteinuria, unspecified: Secondary | ICD-10-CM | POA: Diagnosis not present

## 2015-11-24 DIAGNOSIS — N049 Nephrotic syndrome with unspecified morphologic changes: Secondary | ICD-10-CM | POA: Diagnosis not present

## 2015-11-27 DIAGNOSIS — E785 Hyperlipidemia, unspecified: Secondary | ICD-10-CM | POA: Diagnosis not present

## 2015-11-27 DIAGNOSIS — N184 Chronic kidney disease, stage 4 (severe): Secondary | ICD-10-CM | POA: Diagnosis not present

## 2015-11-27 DIAGNOSIS — I10 Essential (primary) hypertension: Secondary | ICD-10-CM | POA: Diagnosis not present

## 2015-11-27 DIAGNOSIS — N052 Unspecified nephritic syndrome with diffuse membranous glomerulonephritis: Secondary | ICD-10-CM | POA: Diagnosis not present

## 2015-11-27 DIAGNOSIS — R809 Proteinuria, unspecified: Secondary | ICD-10-CM | POA: Diagnosis not present

## 2015-12-27 DIAGNOSIS — I10 Essential (primary) hypertension: Secondary | ICD-10-CM | POA: Diagnosis not present

## 2015-12-27 DIAGNOSIS — N049 Nephrotic syndrome with unspecified morphologic changes: Secondary | ICD-10-CM | POA: Diagnosis not present

## 2015-12-27 DIAGNOSIS — R809 Proteinuria, unspecified: Secondary | ICD-10-CM | POA: Diagnosis not present

## 2015-12-27 DIAGNOSIS — E785 Hyperlipidemia, unspecified: Secondary | ICD-10-CM | POA: Diagnosis not present

## 2015-12-27 DIAGNOSIS — N050 Unspecified nephritic syndrome with minor glomerular abnormality: Secondary | ICD-10-CM | POA: Diagnosis not present

## 2015-12-27 DIAGNOSIS — N052 Unspecified nephritic syndrome with diffuse membranous glomerulonephritis: Secondary | ICD-10-CM | POA: Diagnosis not present

## 2015-12-27 DIAGNOSIS — N183 Chronic kidney disease, stage 3 (moderate): Secondary | ICD-10-CM | POA: Diagnosis not present

## 2016-01-01 DIAGNOSIS — N184 Chronic kidney disease, stage 4 (severe): Secondary | ICD-10-CM | POA: Diagnosis not present

## 2016-01-01 DIAGNOSIS — N052 Unspecified nephritic syndrome with diffuse membranous glomerulonephritis: Secondary | ICD-10-CM | POA: Diagnosis not present

## 2016-01-01 DIAGNOSIS — E785 Hyperlipidemia, unspecified: Secondary | ICD-10-CM | POA: Diagnosis not present

## 2016-01-01 DIAGNOSIS — I10 Essential (primary) hypertension: Secondary | ICD-10-CM | POA: Diagnosis not present

## 2016-01-01 DIAGNOSIS — R809 Proteinuria, unspecified: Secondary | ICD-10-CM | POA: Diagnosis not present

## 2016-02-01 DIAGNOSIS — Z85820 Personal history of malignant melanoma of skin: Secondary | ICD-10-CM | POA: Diagnosis not present

## 2016-02-01 DIAGNOSIS — L821 Other seborrheic keratosis: Secondary | ICD-10-CM | POA: Diagnosis not present

## 2016-02-01 DIAGNOSIS — L570 Actinic keratosis: Secondary | ICD-10-CM | POA: Diagnosis not present

## 2016-02-01 DIAGNOSIS — C4442 Squamous cell carcinoma of skin of scalp and neck: Secondary | ICD-10-CM | POA: Diagnosis not present

## 2016-02-01 DIAGNOSIS — Z85828 Personal history of other malignant neoplasm of skin: Secondary | ICD-10-CM | POA: Diagnosis not present

## 2016-02-01 DIAGNOSIS — D225 Melanocytic nevi of trunk: Secondary | ICD-10-CM | POA: Diagnosis not present

## 2016-02-01 DIAGNOSIS — X32XXXA Exposure to sunlight, initial encounter: Secondary | ICD-10-CM | POA: Diagnosis not present

## 2016-02-01 DIAGNOSIS — D485 Neoplasm of uncertain behavior of skin: Secondary | ICD-10-CM | POA: Diagnosis not present

## 2016-02-06 DIAGNOSIS — N050 Unspecified nephritic syndrome with minor glomerular abnormality: Secondary | ICD-10-CM | POA: Diagnosis not present

## 2016-02-06 DIAGNOSIS — N183 Chronic kidney disease, stage 3 (moderate): Secondary | ICD-10-CM | POA: Diagnosis not present

## 2016-02-06 DIAGNOSIS — R809 Proteinuria, unspecified: Secondary | ICD-10-CM | POA: Diagnosis not present

## 2016-02-06 DIAGNOSIS — E785 Hyperlipidemia, unspecified: Secondary | ICD-10-CM | POA: Diagnosis not present

## 2016-02-06 DIAGNOSIS — N052 Unspecified nephritic syndrome with diffuse membranous glomerulonephritis: Secondary | ICD-10-CM | POA: Diagnosis not present

## 2016-02-06 DIAGNOSIS — I10 Essential (primary) hypertension: Secondary | ICD-10-CM | POA: Diagnosis not present

## 2016-02-06 DIAGNOSIS — N049 Nephrotic syndrome with unspecified morphologic changes: Secondary | ICD-10-CM | POA: Diagnosis not present

## 2016-02-08 DIAGNOSIS — N184 Chronic kidney disease, stage 4 (severe): Secondary | ICD-10-CM | POA: Diagnosis not present

## 2016-02-08 DIAGNOSIS — I10 Essential (primary) hypertension: Secondary | ICD-10-CM | POA: Diagnosis not present

## 2016-02-08 DIAGNOSIS — N052 Unspecified nephritic syndrome with diffuse membranous glomerulonephritis: Secondary | ICD-10-CM | POA: Diagnosis not present

## 2016-02-08 DIAGNOSIS — R809 Proteinuria, unspecified: Secondary | ICD-10-CM | POA: Diagnosis not present

## 2016-02-14 ENCOUNTER — Encounter: Payer: Self-pay | Admitting: Family Medicine

## 2016-02-14 ENCOUNTER — Telehealth: Payer: Self-pay

## 2016-02-14 ENCOUNTER — Ambulatory Visit (INDEPENDENT_AMBULATORY_CARE_PROVIDER_SITE_OTHER): Payer: PPO | Admitting: Family Medicine

## 2016-02-14 VITALS — BP 126/65 | HR 75 | Temp 98.0°F | Wt 204.0 lb

## 2016-02-14 DIAGNOSIS — N390 Urinary tract infection, site not specified: Secondary | ICD-10-CM

## 2016-02-14 DIAGNOSIS — R399 Unspecified symptoms and signs involving the genitourinary system: Secondary | ICD-10-CM | POA: Diagnosis not present

## 2016-02-14 MED ORDER — SULFAMETHOXAZOLE-TRIMETHOPRIM 800-160 MG PO TABS: 1.0000 | ORAL_TABLET | Freq: Two times a day (BID) | ORAL | 0 refills | Status: DC

## 2016-02-14 MED ORDER — DOXYCYCLINE HYCLATE 100 MG PO TABS: 100.0000 mg | ORAL_TABLET | Freq: Two times a day (BID) | ORAL | 0 refills | Status: DC

## 2016-02-14 NOTE — Telephone Encounter (Signed)
Patient says antibiotic info sheet says to let your doctor know if you have kidney disease, which he does. He wants to make sure it is safe for him to take.

## 2016-02-14 NOTE — Telephone Encounter (Signed)
I spoke back with patient and he said he called his kidney doctor and he said it'd be safe to take.

## 2016-02-14 NOTE — Telephone Encounter (Signed)
Pt checked with Nephrologist, who ok'd his use of Bactrim. Doxy script cancelled.

## 2016-02-14 NOTE — Progress Notes (Signed)
BP 126/65   Pulse 75   Temp 98 F (36.7 C)   Wt 204 lb (92.5 kg)   SpO2 99%   BMI 30.57 kg/m    Subjective:    Patient ID: Bill Aguilar, male    DOB: [birth year], 70 y.o.   MRN: [id number]  HPI: Bill Aguilar is a 70 y.o. male  Chief Complaint  Patient presents with  . Urinary Tract Infection    Started Sunday. Frequency and only can pass a small amount. No burning or pain.    Patient presents with urinary frequency, nocturia, incomplete voiding, and low back cramping for about 3-4 days, Has been taking AZO for 2 days and is drinking cranberry juice. States he has had several UTIs in the past and this feels like the early stages of one. Hx of membranous glomerulonephritis followed closely by Nephrology. Denies fever, chills, hematuria, N/V, or abdominal pain.   Relevant past medical, surgical, family and social history reviewed and updated as indicated. Interim medical history since our last visit reviewed. Allergies and medications reviewed and updated.  Review of Systems  Constitutional: Negative.   HENT: Negative.   Eyes: Negative.   Respiratory: Negative.   Cardiovascular: Negative.   Gastrointestinal: Negative.   Genitourinary: Positive for decreased urine volume, flank pain, frequency and urgency. Negative for dysuria, hematuria, scrotal swelling and testicular pain.  Musculoskeletal: Positive for back pain (slightly more than baseline, b/l).  Neurological: Negative.   Psychiatric/Behavioral: Negative.     Per HPI unless specifically indicated above     Objective:    BP 126/65   Pulse 75   Temp 98 F (36.7 C)   Wt 204 lb (92.5 kg)   SpO2 99%   BMI 30.57 kg/m   Wt Readings from Last 3 Encounters:  02/14/16 204 lb (92.5 kg)  11/13/15 207 lb (93.9 kg)  05/15/15 218 lb (98.9 kg)    Physical Exam  Constitutional: He is oriented to person, place, and time. He appears well-developed and well-nourished. No distress.  HENT:  Head:  Atraumatic.  Eyes: Conjunctivae are normal. No scleral icterus.  Neck: Normal range of motion. Neck supple.  Cardiovascular: Normal rate and normal heart sounds.   Pulmonary/Chest: Effort normal. No respiratory distress.  Abdominal: Soft. Bowel sounds are normal. He exhibits no distension. There is no tenderness.  Musculoskeletal: Normal range of motion.  No CVA tenderness  Lymphadenopathy:    He has no cervical adenopathy.  Neurological: He is alert and oriented to person, place, and time.  Skin: Skin is warm and dry. No rash noted.  Psychiatric: He has a normal mood and affect. His behavior is normal.  Nursing note and vitals reviewed.     Assessment & Plan:   Problem List Items Addressed This Visit    None    Visit Diagnoses    Lower urinary tract infection    -  Primary   Afebrile and no CVA tenderness. U/A shows trace leuks - probably very early stage UTI. Given his renal status, will immediately start him on 2 weeks bactrim   Relevant Medications   sulfamethoxazole-trimethoprim (BACTRIM DS,SEPTRA DS) 800-160 MG tablet   Other Relevant Orders   UA/M w/rflx Culture, Routine (STAT)    Await cx. Discussed potentially referring to urology at some point given long history of UTIs, patient not interested at this time. He will follow up for a re-check U/A if symptoms do not fully resolve over the next week or so.  Continue AZO and cranberry juice as needed.  Follow up plan: Return if symptoms worsen or fail to improve.

## 2016-02-14 NOTE — Patient Instructions (Addendum)
Follow up as needed    Fat and Cholesterol Restricted Diet Getting too much fat and cholesterol in your diet may cause health problems. Following this diet helps keep your fat and cholesterol at normal levels. This can keep you from getting sick. WHAT TYPES OF FAT SHOULD I CHOOSE?  Choose monosaturated and polyunsaturated fats. These are found in foods such as olive oil, canola oil, flaxseeds, walnuts, almonds, and seeds.  Eat more omega-3 fats. Good choices include salmon, mackerel, sardines, tuna, flaxseed oil, and ground flaxseeds.  Limit saturated fats. These are in animal products such as meats, butter, and cream. They can also be in plant products such as palm oil, palm kernel oil, and coconut oil.   Avoid foods with partially hydrogenated oils in them. These contain trans fats. Examples of foods that have trans fats are stick margarine, some tub margarines, cookies, crackers, and other baked goods. WHAT GENERAL GUIDELINES DO I NEED TO FOLLOW?   Check food labels. Look for the words "trans fat" and "saturated fat."  When preparing a meal:  Fill half of your plate with vegetables and green salads.  Fill one fourth of your plate with whole grains. Look for the word "whole" as the first word in the ingredient list.  Fill one fourth of your plate with lean protein foods.  Limit fruit to two servings a day. Choose fruit instead of juice.  Eat more foods with soluble fiber. Examples of foods with this type of fiber are apples, broccoli, carrots, beans, peas, and barley. Try to get 20-30 g (grams) of fiber per day.  Eat more home-cooked foods. Eat less at restaurants and buffets.  Limit or avoid alcohol.  Limit foods high in starch and sugar.  Limit fried foods.  Cook foods without frying them. Baking, boiling, grilling, and broiling are all great options.  Lose weight if you are overweight. Losing even a small amount of weight can help your overall health. It can also help  prevent diseases such as diabetes and heart disease. WHAT FOODS CAN I EAT? Grains Whole grains, such as whole wheat or whole grain breads, crackers, cereals, and pasta. Unsweetened oatmeal, bulgur, barley, quinoa, or brown rice. Corn or whole wheat flour tortillas. Vegetables Fresh or frozen vegetables (raw, steamed, roasted, or grilled). Green salads. Fruits All fresh, canned (in natural juice), or frozen fruits. Meat and Other Protein Products Ground beef (85% or leaner), grass-fed beef, or beef trimmed of fat. Skinless chicken or [place]. Ground chicken or [place]. Pork trimmed of fat. All fish and seafood. Eggs. Dried beans, peas, or lentils. Unsalted nuts or seeds. Unsalted canned or dry beans. Dairy Low-fat dairy products, such as skim or 1% milk, 2% or reduced-fat cheeses, low-fat ricotta or cottage cheese, or plain low-fat yogurt. Fats and Oils Tub margarines without trans fats. Light or reduced-fat mayonnaise and salad dressings. Avocado. Olive, canola, sesame, or safflower oils. Natural peanut or almond butter (choose ones without added sugar and oil). The items listed above may not be a complete list of recommended foods or beverages. Contact your dietitian for more options. WHAT FOODS ARE NOT RECOMMENDED? Grains [place] bread. Polich pasta. [place] rice. Cornbread. Bagels, pastries, and croissants. Crackers that contain trans fat. Vegetables Lun potatoes. Corn. Creamed or fried vegetables. Vegetables in a cheese sauce. Fruits Dried fruits. Canned fruit in light or heavy syrup. Fruit juice. Meat and Other Protein Products Fatty cuts of meat. Ribs, chicken wings, bacon, sausage, bologna, salami, chitterlings, fatback, hot dogs, bratwurst, and packaged  luncheon meats. Liver and organ meats. Dairy Whole or 2% milk, cream, half-and-half, and cream cheese. Whole milk cheeses. Whole-fat or sweetened yogurt. Full-fat cheeses. Nondairy creamers and whipped toppings. Processed cheese, cheese  spreads, or cheese curds. Sweets and Desserts Corn syrup, sugars, honey, and molasses. Candy. Jam and jelly. Syrup. Sweetened cereals. Cookies, pies, cakes, donuts, muffins, and ice cream. Fats and Oils Butter, stick margarine, lard, shortening, ghee, or bacon fat. Coconut, palm kernel, or palm oils. Beverages Alcohol. Sweetened drinks (such as sodas, lemonade, and fruit drinks or punches). The items listed above may not be a complete list of foods and beverages to avoid. Contact your dietitian for more information.   This information is not intended to replace advice given to you by your health care provider. Make sure you discuss any questions you have with your health care provider.   Document Released: 12/10/2011 Document Revised: 07/01/2014 Document Reviewed: 09/09/2013 Elsevier Interactive Patient Education [employer].

## 2016-02-17 LAB — MICROSCOPIC EXAMINATION: Epithelial Cells (non renal): NONE SEEN /hpf (ref 0–10)

## 2016-02-17 LAB — UA/M W/RFLX CULTURE, ROUTINE
Bilirubin, UA: NEGATIVE
Glucose, UA: NEGATIVE
Ketones, UA: NEGATIVE
Nitrite, UA: NEGATIVE
Specific Gravity, UA: 1.025 (ref 1.005–1.030)
Urobilinogen, Ur: 0.2 mg/dL (ref 0.2–1.0)
pH, UA: 5 (ref 5.0–7.5)

## 2016-02-17 LAB — URINE CULTURE, REFLEX

## 2016-02-20 ENCOUNTER — Telehealth: Payer: Self-pay | Admitting: Family Medicine

## 2016-02-20 NOTE — Telephone Encounter (Signed)
Please call patient and let him know that his urine culture did grow E. Coli . Ask him if he would be willing to stop by to leave a urine sample sometime next week to make sure the infection has cleared. If still symptomatic, I can adjust his therapy sooner. Thanks!

## 2016-02-20 NOTE — Telephone Encounter (Signed)
Patient notified. He is feeling a lot better. He is going out of town for a month on Tuesday so he won't be able to come in for a u/a after his antibiotics are complete, but he will stop by on Friday to have it rechecked.

## 2016-02-22 DIAGNOSIS — N050 Unspecified nephritic syndrome with minor glomerular abnormality: Secondary | ICD-10-CM | POA: Diagnosis not present

## 2016-02-22 DIAGNOSIS — N049 Nephrotic syndrome with unspecified morphologic changes: Secondary | ICD-10-CM | POA: Diagnosis not present

## 2016-02-22 DIAGNOSIS — N052 Unspecified nephritic syndrome with diffuse membranous glomerulonephritis: Secondary | ICD-10-CM | POA: Diagnosis not present

## 2016-02-22 DIAGNOSIS — E785 Hyperlipidemia, unspecified: Secondary | ICD-10-CM | POA: Diagnosis not present

## 2016-02-22 DIAGNOSIS — R809 Proteinuria, unspecified: Secondary | ICD-10-CM | POA: Diagnosis not present

## 2016-02-22 DIAGNOSIS — I10 Essential (primary) hypertension: Secondary | ICD-10-CM | POA: Diagnosis not present

## 2016-02-22 DIAGNOSIS — N183 Chronic kidney disease, stage 3 (moderate): Secondary | ICD-10-CM | POA: Diagnosis not present

## 2016-02-23 ENCOUNTER — Other Ambulatory Visit: Payer: PPO

## 2016-02-23 DIAGNOSIS — N390 Urinary tract infection, site not specified: Secondary | ICD-10-CM | POA: Diagnosis not present

## 2016-02-23 LAB — MICROSCOPIC EXAMINATION

## 2016-02-23 LAB — UA/M W/RFLX CULTURE, ROUTINE
Bilirubin, UA: NEGATIVE
Glucose, UA: NEGATIVE
Ketones, UA: NEGATIVE
Leukocytes, UA: NEGATIVE
Nitrite, UA: NEGATIVE
Specific Gravity, UA: 1.02 (ref 1.005–1.030)
Urobilinogen, Ur: 0.2 mg/dL (ref 0.2–1.0)
pH, UA: 5.5 (ref 5.0–7.5)

## 2016-03-04 DIAGNOSIS — R809 Proteinuria, unspecified: Secondary | ICD-10-CM | POA: Diagnosis not present

## 2016-03-04 DIAGNOSIS — E785 Hyperlipidemia, unspecified: Secondary | ICD-10-CM | POA: Diagnosis not present

## 2016-03-04 DIAGNOSIS — N050 Unspecified nephritic syndrome with minor glomerular abnormality: Secondary | ICD-10-CM | POA: Diagnosis not present

## 2016-03-04 DIAGNOSIS — N183 Chronic kidney disease, stage 3 (moderate): Secondary | ICD-10-CM | POA: Diagnosis not present

## 2016-03-04 DIAGNOSIS — N052 Unspecified nephritic syndrome with diffuse membranous glomerulonephritis: Secondary | ICD-10-CM | POA: Diagnosis not present

## 2016-03-04 DIAGNOSIS — I10 Essential (primary) hypertension: Secondary | ICD-10-CM | POA: Diagnosis not present

## 2016-03-04 DIAGNOSIS — N049 Nephrotic syndrome with unspecified morphologic changes: Secondary | ICD-10-CM | POA: Diagnosis not present

## 2016-03-29 DIAGNOSIS — D044 Carcinoma in situ of skin of scalp and neck: Secondary | ICD-10-CM | POA: Diagnosis not present

## 2016-03-29 DIAGNOSIS — C4442 Squamous cell carcinoma of skin of scalp and neck: Secondary | ICD-10-CM | POA: Diagnosis not present

## 2016-03-29 DIAGNOSIS — L905 Scar conditions and fibrosis of skin: Secondary | ICD-10-CM | POA: Diagnosis not present

## 2016-04-04 DIAGNOSIS — N049 Nephrotic syndrome with unspecified morphologic changes: Secondary | ICD-10-CM | POA: Diagnosis not present

## 2016-04-04 DIAGNOSIS — N183 Chronic kidney disease, stage 3 (moderate): Secondary | ICD-10-CM | POA: Diagnosis not present

## 2016-04-04 DIAGNOSIS — N052 Unspecified nephritic syndrome with diffuse membranous glomerulonephritis: Secondary | ICD-10-CM | POA: Diagnosis not present

## 2016-04-04 DIAGNOSIS — N050 Unspecified nephritic syndrome with minor glomerular abnormality: Secondary | ICD-10-CM | POA: Diagnosis not present

## 2016-04-08 DIAGNOSIS — N052 Unspecified nephritic syndrome with diffuse membranous glomerulonephritis: Secondary | ICD-10-CM | POA: Diagnosis not present

## 2016-04-08 DIAGNOSIS — N184 Chronic kidney disease, stage 4 (severe): Secondary | ICD-10-CM | POA: Diagnosis not present

## 2016-04-08 DIAGNOSIS — R809 Proteinuria, unspecified: Secondary | ICD-10-CM | POA: Diagnosis not present

## 2016-04-08 DIAGNOSIS — I129 Hypertensive chronic kidney disease with stage 1 through stage 4 chronic kidney disease, or unspecified chronic kidney disease: Secondary | ICD-10-CM | POA: Diagnosis not present

## 2016-05-15 ENCOUNTER — Encounter: Payer: PPO | Admitting: Family Medicine

## 2016-05-20 DIAGNOSIS — N049 Nephrotic syndrome with unspecified morphologic changes: Secondary | ICD-10-CM | POA: Diagnosis not present

## 2016-05-20 DIAGNOSIS — N050 Unspecified nephritic syndrome with minor glomerular abnormality: Secondary | ICD-10-CM | POA: Diagnosis not present

## 2016-05-20 DIAGNOSIS — N183 Chronic kidney disease, stage 3 (moderate): Secondary | ICD-10-CM | POA: Diagnosis not present

## 2016-05-24 DIAGNOSIS — R809 Proteinuria, unspecified: Secondary | ICD-10-CM | POA: Diagnosis not present

## 2016-05-24 DIAGNOSIS — I129 Hypertensive chronic kidney disease with stage 1 through stage 4 chronic kidney disease, or unspecified chronic kidney disease: Secondary | ICD-10-CM | POA: Diagnosis not present

## 2016-05-24 DIAGNOSIS — N184 Chronic kidney disease, stage 4 (severe): Secondary | ICD-10-CM | POA: Diagnosis not present

## 2016-05-24 DIAGNOSIS — N052 Unspecified nephritic syndrome with diffuse membranous glomerulonephritis: Secondary | ICD-10-CM | POA: Diagnosis not present

## 2016-05-24 DIAGNOSIS — N2581 Secondary hyperparathyroidism of renal origin: Secondary | ICD-10-CM | POA: Diagnosis not present

## 2016-06-06 ENCOUNTER — Ambulatory Visit (INDEPENDENT_AMBULATORY_CARE_PROVIDER_SITE_OTHER): Payer: PPO | Admitting: Family Medicine

## 2016-06-06 ENCOUNTER — Encounter: Payer: Self-pay | Admitting: Family Medicine

## 2016-06-06 VITALS — BP 108/64 | HR 73 | Temp 98.1°F | Ht 69.25 in | Wt 209.2 lb

## 2016-06-06 DIAGNOSIS — Z23 Encounter for immunization: Secondary | ICD-10-CM | POA: Diagnosis not present

## 2016-06-06 DIAGNOSIS — E039 Hypothyroidism, unspecified: Secondary | ICD-10-CM

## 2016-06-06 DIAGNOSIS — E7800 Pure hypercholesterolemia, unspecified: Secondary | ICD-10-CM

## 2016-06-06 DIAGNOSIS — G7000 Myasthenia gravis without (acute) exacerbation: Secondary | ICD-10-CM | POA: Diagnosis not present

## 2016-06-06 DIAGNOSIS — N052 Unspecified nephritic syndrome with diffuse membranous glomerulonephritis: Secondary | ICD-10-CM

## 2016-06-06 DIAGNOSIS — I10 Essential (primary) hypertension: Secondary | ICD-10-CM

## 2016-06-06 DIAGNOSIS — N400 Enlarged prostate without lower urinary tract symptoms: Secondary | ICD-10-CM | POA: Diagnosis not present

## 2016-06-06 MED ORDER — LEVOTHYROXINE SODIUM 175 MCG PO TABS: 175.0000 ug | ORAL_TABLET | Freq: Every day | ORAL | 4 refills | Status: DC

## 2016-06-06 MED ORDER — BENAZEPRIL HCL 40 MG PO TABS: 40.0000 mg | ORAL_TABLET | Freq: Every day | ORAL | 4 refills | Status: DC

## 2016-06-06 MED ORDER — ATORVASTATIN CALCIUM 20 MG PO TABS: 20.0000 mg | ORAL_TABLET | Freq: Every day | ORAL | 4 refills | Status: DC

## 2016-06-06 NOTE — Assessment & Plan Note (Signed)
The current medical regimen is effective;  continue present plan and medications.  

## 2016-06-06 NOTE — Assessment & Plan Note (Addendum)
The current medical regimen is effective;  continue present plan and medications. Followed by nephrology

## 2016-06-06 NOTE — Assessment & Plan Note (Signed)
stable °

## 2016-06-06 NOTE — Progress Notes (Signed)
BP 108/64 (BP Location: Left Arm, Patient Position: Sitting, Cuff Size: Normal)   Pulse 73   Temp 98.1 F (36.7 C)   Ht 5' 9.25" (1.759 m)   Wt 209 lb 3.2 oz (94.9 kg)   SpO2 98%   BMI 30.67 kg/m    Subjective:    Patient ID: Bill Aguilar, male    DOB: 01/17/1946, 70 y.o.   MRN: [id number]  HPI: Bill Aguilar is a 70 y.o. male  Chief Complaint  Patient presents with  . Annual Exam  AWV metrics met  Patient follow-up has been seen nephrology for kidney function stopped them in medication and has felt better kidney function has remained relatively stable. Blood pressure cholesterol thyroid all doing well no complaints from medications. Patient especially with me in compromising agent for kidneys felt fatigued and lost weight with not being able to eat has been able to gain weight back now that is off medication. Myasthenia gravis stable followed by neurology at [facility] Relevant past medical, surgical, family and social history reviewed and updated as indicated. Interim medical history since our last visit reviewed. Allergies and medications reviewed and updated.  Review of Systems  Constitutional: Negative.   HENT: Negative.   Eyes: Negative.   Respiratory: Negative.   Cardiovascular: Negative.   Gastrointestinal: Negative.   Endocrine: Negative.   Genitourinary: Negative.   Musculoskeletal: Negative.   Skin: Negative.   Allergic/Immunologic: Negative.   Neurological: Negative.   Hematological: Negative.   Psychiatric/Behavioral: Negative.     Per HPI unless specifically indicated above     Objective:    BP 108/64 (BP Location: Left Arm, Patient Position: Sitting, Cuff Size: Normal)   Pulse 73   Temp 98.1 F (36.7 C)   Ht 5' 9.25" (1.759 m)   Wt 209 lb 3.2 oz (94.9 kg)   SpO2 98%   BMI 30.67 kg/m   Wt Readings from Last 3 Encounters:  06/06/16 209 lb 3.2 oz (94.9 kg)  02/14/16 204 lb (92.5 kg)  11/13/15 207 lb (93.9 kg)    Physical Exam    Constitutional: He is oriented to person, place, and time. He appears well-developed and well-nourished.  HENT:  Head: Normocephalic and atraumatic.  Right Ear: External ear normal.  Left Ear: External ear normal.  Eyes: Conjunctivae and EOM are normal. Pupils are equal, round, and reactive to light.  Neck: Normal range of motion. Neck supple.  Cardiovascular: Normal rate, regular rhythm, normal heart sounds and intact distal pulses.   Pulmonary/Chest: Effort normal and breath sounds normal.  Abdominal: Soft. Bowel sounds are normal. There is no splenomegaly or hepatomegaly.  Genitourinary: Rectum normal and penis normal.  Genitourinary Comments: BPH changes  Musculoskeletal: Normal range of motion.  Neurological: He is alert and oriented to person, place, and time. He has normal reflexes.  Skin: No rash noted. No erythema.  Psychiatric: He has a normal mood and affect. His behavior is normal. Judgment and thought content normal.    Results for orders placed or performed in visit on 02/23/16  Microscopic Examination  Result Value Ref Range   WBC, UA 0-5 0 - 5 /hpf   RBC, UA 0-2 0 - 2 /hpf   Epithelial Cells (non renal) 0-10 0 - 10 /hpf   Crystals Present (A) N/A   Crystal Type Amorphous Sediment N/A   Mucus, UA Present Not Estab.   Bacteria, UA Few None seen/Few  UA/M w/rflx Culture, Routine (STAT)  Result Value Ref Range  Specific Gravity, UA 1.020 1.005 - 1.030   pH, UA 5.5 5.0 - 7.5   Color, UA Yellow Yellow   Appearance Ur Clear Clear   Leukocytes, UA Negative Negative   Protein, UA 2+ (A) Negative/Trace   Glucose, UA Negative Negative   Ketones, UA Negative Negative   RBC, UA Trace (A) Negative   Bilirubin, UA Negative Negative   Urobilinogen, Ur 0.2 0.2 - 1.0 mg/dL   Nitrite, UA Negative Negative   Microscopic Examination See below:       Assessment & Plan:   Problem List Items Addressed This Visit      Cardiovascular and Mediastinum   Essential hypertension     The current medical regimen is effective;  continue present plan and medications.       Relevant Medications   benazepril (LOTENSIN) 40 MG tablet   atorvastatin (LIPITOR) 20 MG tablet   Other Relevant Orders   Comprehensive metabolic panel     Endocrine   Hypothyroidism    The current medical regimen is effective;  continue present plan and medications.       Relevant Medications   levothyroxine (SYNTHROID, LEVOTHROID) 175 MCG tablet   Other Relevant Orders   TSH     Nervous and Auditory   Myasthenia gravis ([place])    The current medical regimen is effective;  continue present plan and medications.         Genitourinary   Membranous glomerulonephritis, stage 4    The current medical regimen is effective;  continue present plan and medications. Followed by nephrology      Relevant Orders   Comprehensive metabolic panel   CBC with Differential/Platelet   BPH (benign prostatic hyperplasia)    stable      Relevant Orders   PSA     Other   Hyperlipidemia    The current medical regimen is effective;  continue present plan and medications.       Relevant Medications   benazepril (LOTENSIN) 40 MG tablet   atorvastatin (LIPITOR) 20 MG tablet   Other Relevant Orders   Lipid panel    Other Visit Diagnoses    Need for influenza vaccination    -  Primary   Relevant Orders   Flu vaccine HIGH DOSE PF (Fluzone High dose) (Completed)       Follow up plan: Return in about 6 months (around 12/05/2016) for BMP,  Lipids, ALT, AST.

## 2016-06-07 LAB — LIPID PANEL
Chol/HDL Ratio: 4.7 ratio units (ref 0.0–5.0)
Cholesterol, Total: 192 mg/dL (ref 100–199)
HDL: 41 mg/dL (ref >39)
Triglycerides: 468 mg/dL — ABNORMAL HIGH (ref 0–149)

## 2016-06-07 LAB — CBC WITH DIFFERENTIAL/PLATELET
Basophils Absolute: 0 10*3/uL (ref 0.0–0.2)
Basos: 0 %
EOS (ABSOLUTE): 0.2 10*3/uL (ref 0.0–0.4)
Eos: 3 %
Hematocrit: 46.2 % (ref 37.5–51.0)
Hemoglobin: 16 g/dL (ref 13.0–17.7)
Immature Grans (Abs): 0.1 10*3/uL (ref 0.0–0.1)
Immature Granulocytes: 1 %
Lymphocytes Absolute: 1.8 10*3/uL (ref 0.7–3.1)
Lymphs: 26 %
MCH: 32.5 pg (ref 26.6–33.0)
MCHC: 34.6 g/dL (ref 31.5–35.7)
MCV: 94 fL (ref 79–97)
Monocytes Absolute: 0.7 10*3/uL (ref 0.1–0.9)
Monocytes: 11 %
Neutrophils Absolute: 4 10*3/uL (ref 1.4–7.0)
Neutrophils: 59 %
Platelets: 215 10*3/uL (ref 150–379)
RBC: 4.92 x10E6/uL (ref 4.14–5.80)
RDW: 15.1 % (ref 12.3–15.4)
WBC: 6.8 10*3/uL (ref 3.4–10.8)

## 2016-06-07 LAB — COMPREHENSIVE METABOLIC PANEL
ALT: 69 IU/L — ABNORMAL HIGH (ref 0–44)
AST: 46 IU/L — ABNORMAL HIGH (ref 0–40)
Albumin/Globulin Ratio: 1.7 (ref 1.2–2.2)
Albumin: 4.1 g/dL (ref 3.5–4.8)
Alkaline Phosphatase: 188 IU/L — ABNORMAL HIGH (ref 39–117)
BUN/Creatinine Ratio: 11 (ref 10–24)
BUN: 25 mg/dL (ref 8–27)
Bilirubin Total: 0.3 mg/dL (ref 0.0–1.2)
CO2: 23 mmol/L (ref 18–29)
Calcium: 9.9 mg/dL (ref 8.6–10.2)
Chloride: 100 mmol/L (ref 96–106)
Creatinine, Ser: 2.36 mg/dL — ABNORMAL HIGH (ref 0.76–1.27)
GFR calc Af Amer: 31 mL/min/{1.73_m2} — ABNORMAL LOW (ref >59)
GFR calc non Af Amer: 27 mL/min/{1.73_m2} — ABNORMAL LOW (ref >59)
Globulin, Total: 2.4 g/dL (ref 1.5–4.5)
Glucose: 99 mg/dL (ref 65–99)
Potassium: 4.6 mmol/L (ref 3.5–5.2)
Sodium: 143 mmol/L (ref 134–144)
Total Protein: 6.5 g/dL (ref 6.0–8.5)

## 2016-06-07 LAB — TSH: TSH: 1.72 u[IU]/mL (ref 0.450–4.500)

## 2016-06-07 LAB — PSA: Prostate Specific Ag, Serum: 4.8 ng/mL — ABNORMAL HIGH (ref 0.0–4.0)

## 2016-06-11 ENCOUNTER — Telehealth: Payer: Self-pay | Admitting: Family Medicine

## 2016-06-11 DIAGNOSIS — R748 Abnormal levels of other serum enzymes: Secondary | ICD-10-CM

## 2016-06-11 DIAGNOSIS — E7800 Pure hypercholesterolemia, unspecified: Secondary | ICD-10-CM

## 2016-06-11 DIAGNOSIS — R972 Elevated prostate specific antigen [PSA]: Secondary | ICD-10-CM

## 2016-06-11 MED ORDER — ATORVASTATIN CALCIUM 40 MG PO TABS: 40.0000 mg | ORAL_TABLET | Freq: Every day | ORAL | 4 refills | Status: DC

## 2016-06-11 NOTE — Telephone Encounter (Signed)
-----   Message from Moss Mc, [place] sent at 06/11/2016 11:54 AM EST ----- Phone call.

## 2016-06-11 NOTE — Telephone Encounter (Signed)
Phone call Discussed with patient renal function worsening off medications and alkaline phosphatase and liver function up somewhat patient correcting records taking atorvastatin 40 instead of 20. Will recheck PSA which was elevated and CMP 3 months.

## 2016-06-28 DIAGNOSIS — N183 Chronic kidney disease, stage 3 (moderate): Secondary | ICD-10-CM | POA: Diagnosis not present

## 2016-06-28 DIAGNOSIS — R809 Proteinuria, unspecified: Secondary | ICD-10-CM | POA: Diagnosis not present

## 2016-06-28 DIAGNOSIS — E785 Hyperlipidemia, unspecified: Secondary | ICD-10-CM | POA: Diagnosis not present

## 2016-06-28 DIAGNOSIS — N050 Unspecified nephritic syndrome with minor glomerular abnormality: Secondary | ICD-10-CM | POA: Diagnosis not present

## 2016-06-28 DIAGNOSIS — R319 Hematuria, unspecified: Secondary | ICD-10-CM | POA: Diagnosis not present

## 2016-06-28 DIAGNOSIS — N184 Chronic kidney disease, stage 4 (severe): Secondary | ICD-10-CM | POA: Diagnosis not present

## 2016-06-28 DIAGNOSIS — I10 Essential (primary) hypertension: Secondary | ICD-10-CM | POA: Diagnosis not present

## 2016-06-28 DIAGNOSIS — N049 Nephrotic syndrome with unspecified morphologic changes: Secondary | ICD-10-CM | POA: Diagnosis not present

## 2016-06-28 DIAGNOSIS — N052 Unspecified nephritic syndrome with diffuse membranous glomerulonephritis: Secondary | ICD-10-CM | POA: Diagnosis not present

## 2016-07-01 DIAGNOSIS — N052 Unspecified nephritic syndrome with diffuse membranous glomerulonephritis: Secondary | ICD-10-CM | POA: Diagnosis not present

## 2016-07-01 DIAGNOSIS — N184 Chronic kidney disease, stage 4 (severe): Secondary | ICD-10-CM | POA: Diagnosis not present

## 2016-07-01 DIAGNOSIS — R809 Proteinuria, unspecified: Secondary | ICD-10-CM | POA: Diagnosis not present

## 2016-07-01 DIAGNOSIS — I10 Essential (primary) hypertension: Secondary | ICD-10-CM | POA: Diagnosis not present

## 2016-07-02 DIAGNOSIS — L298 Other pruritus: Secondary | ICD-10-CM | POA: Diagnosis not present

## 2016-07-02 DIAGNOSIS — D225 Melanocytic nevi of trunk: Secondary | ICD-10-CM | POA: Diagnosis not present

## 2016-07-02 DIAGNOSIS — C44629 Squamous cell carcinoma of skin of left upper limb, including shoulder: Secondary | ICD-10-CM | POA: Diagnosis not present

## 2016-07-02 DIAGNOSIS — L538 Other specified erythematous conditions: Secondary | ICD-10-CM | POA: Diagnosis not present

## 2016-07-02 DIAGNOSIS — L570 Actinic keratosis: Secondary | ICD-10-CM | POA: Diagnosis not present

## 2016-07-02 DIAGNOSIS — D485 Neoplasm of uncertain behavior of skin: Secondary | ICD-10-CM | POA: Diagnosis not present

## 2016-07-02 DIAGNOSIS — X32XXXA Exposure to sunlight, initial encounter: Secondary | ICD-10-CM | POA: Diagnosis not present

## 2016-07-02 DIAGNOSIS — Z85828 Personal history of other malignant neoplasm of skin: Secondary | ICD-10-CM | POA: Diagnosis not present

## 2016-07-02 DIAGNOSIS — D2261 Melanocytic nevi of right upper limb, including shoulder: Secondary | ICD-10-CM | POA: Diagnosis not present

## 2016-07-02 DIAGNOSIS — Z85820 Personal history of malignant melanoma of skin: Secondary | ICD-10-CM | POA: Diagnosis not present

## 2016-07-02 DIAGNOSIS — L820 Inflamed seborrheic keratosis: Secondary | ICD-10-CM | POA: Diagnosis not present

## 2016-08-02 DIAGNOSIS — N050 Unspecified nephritic syndrome with minor glomerular abnormality: Secondary | ICD-10-CM | POA: Diagnosis not present

## 2016-08-02 DIAGNOSIS — R809 Proteinuria, unspecified: Secondary | ICD-10-CM | POA: Diagnosis not present

## 2016-08-02 DIAGNOSIS — N183 Chronic kidney disease, stage 3 (moderate): Secondary | ICD-10-CM | POA: Diagnosis not present

## 2016-08-02 DIAGNOSIS — E785 Hyperlipidemia, unspecified: Secondary | ICD-10-CM | POA: Diagnosis not present

## 2016-08-02 DIAGNOSIS — N052 Unspecified nephritic syndrome with diffuse membranous glomerulonephritis: Secondary | ICD-10-CM | POA: Diagnosis not present

## 2016-08-02 DIAGNOSIS — N049 Nephrotic syndrome with unspecified morphologic changes: Secondary | ICD-10-CM | POA: Diagnosis not present

## 2016-08-02 DIAGNOSIS — I10 Essential (primary) hypertension: Secondary | ICD-10-CM | POA: Diagnosis not present

## 2016-08-07 DIAGNOSIS — R809 Proteinuria, unspecified: Secondary | ICD-10-CM | POA: Diagnosis not present

## 2016-08-07 DIAGNOSIS — I10 Essential (primary) hypertension: Secondary | ICD-10-CM | POA: Diagnosis not present

## 2016-08-07 DIAGNOSIS — N184 Chronic kidney disease, stage 4 (severe): Secondary | ICD-10-CM | POA: Diagnosis not present

## 2016-08-07 DIAGNOSIS — N052 Unspecified nephritic syndrome with diffuse membranous glomerulonephritis: Secondary | ICD-10-CM | POA: Diagnosis not present

## 2016-08-22 DIAGNOSIS — D0462 Carcinoma in situ of skin of left upper limb, including shoulder: Secondary | ICD-10-CM | POA: Diagnosis not present

## 2016-08-22 DIAGNOSIS — C44629 Squamous cell carcinoma of skin of left upper limb, including shoulder: Secondary | ICD-10-CM | POA: Diagnosis not present

## 2016-09-13 DIAGNOSIS — E785 Hyperlipidemia, unspecified: Secondary | ICD-10-CM | POA: Diagnosis not present

## 2016-09-13 DIAGNOSIS — N050 Unspecified nephritic syndrome with minor glomerular abnormality: Secondary | ICD-10-CM | POA: Diagnosis not present

## 2016-09-13 DIAGNOSIS — N183 Chronic kidney disease, stage 3 (moderate): Secondary | ICD-10-CM | POA: Diagnosis not present

## 2016-09-13 DIAGNOSIS — I10 Essential (primary) hypertension: Secondary | ICD-10-CM | POA: Diagnosis not present

## 2016-09-13 DIAGNOSIS — N049 Nephrotic syndrome with unspecified morphologic changes: Secondary | ICD-10-CM | POA: Diagnosis not present

## 2016-09-13 DIAGNOSIS — R809 Proteinuria, unspecified: Secondary | ICD-10-CM | POA: Diagnosis not present

## 2016-09-13 DIAGNOSIS — N052 Unspecified nephritic syndrome with diffuse membranous glomerulonephritis: Secondary | ICD-10-CM | POA: Diagnosis not present

## 2016-09-18 DIAGNOSIS — N052 Unspecified nephritic syndrome with diffuse membranous glomerulonephritis: Secondary | ICD-10-CM | POA: Diagnosis not present

## 2016-09-18 DIAGNOSIS — I10 Essential (primary) hypertension: Secondary | ICD-10-CM | POA: Diagnosis not present

## 2016-09-18 DIAGNOSIS — N184 Chronic kidney disease, stage 4 (severe): Secondary | ICD-10-CM | POA: Diagnosis not present

## 2016-09-18 DIAGNOSIS — R809 Proteinuria, unspecified: Secondary | ICD-10-CM | POA: Diagnosis not present

## 2016-10-28 DIAGNOSIS — N183 Chronic kidney disease, stage 3 (moderate): Secondary | ICD-10-CM | POA: Diagnosis not present

## 2016-10-28 DIAGNOSIS — E875 Hyperkalemia: Secondary | ICD-10-CM | POA: Diagnosis not present

## 2016-10-28 DIAGNOSIS — N052 Unspecified nephritic syndrome with diffuse membranous glomerulonephritis: Secondary | ICD-10-CM | POA: Diagnosis not present

## 2016-10-28 DIAGNOSIS — N049 Nephrotic syndrome with unspecified morphologic changes: Secondary | ICD-10-CM | POA: Diagnosis not present

## 2016-10-28 DIAGNOSIS — N050 Unspecified nephritic syndrome with minor glomerular abnormality: Secondary | ICD-10-CM | POA: Diagnosis not present

## 2016-10-28 DIAGNOSIS — R809 Proteinuria, unspecified: Secondary | ICD-10-CM | POA: Diagnosis not present

## 2016-10-28 DIAGNOSIS — I10 Essential (primary) hypertension: Secondary | ICD-10-CM | POA: Diagnosis not present

## 2016-10-29 ENCOUNTER — Encounter: Payer: Self-pay | Admitting: Unknown Physician Specialty

## 2016-10-29 ENCOUNTER — Ambulatory Visit (INDEPENDENT_AMBULATORY_CARE_PROVIDER_SITE_OTHER): Payer: PPO | Admitting: Unknown Physician Specialty

## 2016-10-29 VITALS — BP 121/74 | HR 84 | Temp 98.7°F | Wt 215.6 lb

## 2016-10-29 DIAGNOSIS — J069 Acute upper respiratory infection, unspecified: Secondary | ICD-10-CM

## 2016-10-29 DIAGNOSIS — R05 Cough: Secondary | ICD-10-CM

## 2016-10-29 DIAGNOSIS — R059 Cough, unspecified: Secondary | ICD-10-CM

## 2016-10-29 MED ORDER — HYDROCOD POLST-CPM POLST ER 10-8 MG/5ML PO SUER: 5.0000 mL | Freq: Two times a day (BID) | ORAL | 0 refills | Status: DC | PRN

## 2016-10-29 MED ORDER — GUAIFENESIN-CODEINE 100-10 MG/5ML PO SOLN: 10.0000 mL | Freq: Three times a day (TID) | ORAL | 0 refills | Status: DC | PRN

## 2016-10-29 NOTE — Progress Notes (Signed)
   BP 121/74   Pulse 84   Temp 98.7 F (37.1 C)   Wt 215 lb 9.6 oz (97.8 kg)   SpO2 98%   BMI 31.61 kg/m    Subjective:    Patient ID: Bill Aguilar, male    DOB: 06-Jan-1946, 71 y.o.   MRN: [id number]  HPI: Charels Aguilar is a 71 y.o. male  Chief Complaint  Patient presents with  . URI    pt states he has had a cough and chest congestion since last Sunday. States he has tried taking mucinex.    URI   This is a new problem. Episode onset: 9 days. The problem has been gradually improving. There has been no fever. Associated symptoms include congestion and coughing. Treatments tried: mucinex. The treatment provided no relief.     Relevant past medical, surgical, family and social history reviewed and updated as indicated. Interim medical history since our last visit reviewed. Allergies and medications reviewed and updated.  Review of Systems  HENT: Positive for congestion.   Respiratory: Positive for cough.     Per HPI unless specifically indicated above     Objective:    BP 121/74   Pulse 84   Temp 98.7 F (37.1 C)   Wt 215 lb 9.6 oz (97.8 kg)   SpO2 98%   BMI 31.61 kg/m   Wt Readings from Last 3 Encounters:  10/29/16 215 lb 9.6 oz (97.8 kg)  06/06/16 209 lb 3.2 oz (94.9 kg)  02/14/16 204 lb (92.5 kg)    Physical Exam  Constitutional: He is oriented to person, place, and time. He appears well-developed and well-nourished. No distress.  HENT:  Head: Normocephalic and atraumatic.  Right Ear: Tympanic membrane and ear canal normal.  Left Ear: Tympanic membrane and ear canal normal.  Nose: Rhinorrhea present. Right sinus exhibits no maxillary sinus tenderness and no frontal sinus tenderness. Left sinus exhibits no maxillary sinus tenderness and no frontal sinus tenderness.  Mouth/Throat: Uvula is midline. Posterior oropharyngeal edema present.  Eyes: Conjunctivae and lids are normal. Right eye exhibits no discharge. Left eye exhibits no discharge.  No scleral icterus.  Neck: Neck supple.  Cardiovascular: Normal rate, regular rhythm and normal heart sounds.   Pulmonary/Chest: Effort normal and breath sounds normal. No respiratory distress.  Abdominal: Normal appearance. There is no splenomegaly or hepatomegaly.  Musculoskeletal: Normal range of motion.  Neurological: He is alert and oriented to person, place, and time.  Skin: Skin is warm, dry and intact. No rash noted. No pallor.  Psychiatric: He has a normal mood and affect. His behavior is normal. Judgment and thought content normal.  Nursing note and vitals reviewed.     Assessment & Plan:   Problem List Items Addressed This Visit    None    Visit Diagnoses    Cough    -  Primary   Viral upper respiratory tract infection       Pt seems to be improving.  He is also reluctant to take antibiotics due to CKD.  Will rx Robitussin with Codeine for night time use   Relevant Medications   mycophenolate (CELLCEPT) 250 MG capsule       Follow up plan: Return if symptoms worsen or fail to improve.

## 2016-12-02 DIAGNOSIS — X32XXXA Exposure to sunlight, initial encounter: Secondary | ICD-10-CM | POA: Diagnosis not present

## 2016-12-02 DIAGNOSIS — L538 Other specified erythematous conditions: Secondary | ICD-10-CM | POA: Diagnosis not present

## 2016-12-02 DIAGNOSIS — Z85828 Personal history of other malignant neoplasm of skin: Secondary | ICD-10-CM | POA: Diagnosis not present

## 2016-12-02 DIAGNOSIS — L821 Other seborrheic keratosis: Secondary | ICD-10-CM | POA: Diagnosis not present

## 2016-12-02 DIAGNOSIS — Z08 Encounter for follow-up examination after completed treatment for malignant neoplasm: Secondary | ICD-10-CM | POA: Diagnosis not present

## 2016-12-02 DIAGNOSIS — D485 Neoplasm of uncertain behavior of skin: Secondary | ICD-10-CM | POA: Diagnosis not present

## 2016-12-02 DIAGNOSIS — R208 Other disturbances of skin sensation: Secondary | ICD-10-CM | POA: Diagnosis not present

## 2016-12-02 DIAGNOSIS — Z85820 Personal history of malignant melanoma of skin: Secondary | ICD-10-CM | POA: Diagnosis not present

## 2016-12-02 DIAGNOSIS — L570 Actinic keratosis: Secondary | ICD-10-CM | POA: Diagnosis not present

## 2016-12-02 DIAGNOSIS — C44529 Squamous cell carcinoma of skin of other part of trunk: Secondary | ICD-10-CM | POA: Diagnosis not present

## 2016-12-02 DIAGNOSIS — L820 Inflamed seborrheic keratosis: Secondary | ICD-10-CM | POA: Diagnosis not present

## 2016-12-05 ENCOUNTER — Encounter: Payer: Self-pay | Admitting: Family Medicine

## 2016-12-05 ENCOUNTER — Ambulatory Visit (INDEPENDENT_AMBULATORY_CARE_PROVIDER_SITE_OTHER): Payer: PPO | Admitting: Family Medicine

## 2016-12-05 VITALS — BP 120/76 | HR 82 | Wt 217.0 lb

## 2016-12-05 DIAGNOSIS — I10 Essential (primary) hypertension: Secondary | ICD-10-CM | POA: Diagnosis not present

## 2016-12-05 DIAGNOSIS — G7000 Myasthenia gravis without (acute) exacerbation: Secondary | ICD-10-CM | POA: Diagnosis not present

## 2016-12-05 DIAGNOSIS — N052 Unspecified nephritic syndrome with diffuse membranous glomerulonephritis: Secondary | ICD-10-CM | POA: Diagnosis not present

## 2016-12-05 DIAGNOSIS — E7800 Pure hypercholesterolemia, unspecified: Secondary | ICD-10-CM | POA: Diagnosis not present

## 2016-12-05 LAB — LP+ALT+AST PICCOLO, WAIVED
ALT (SGPT) Piccolo, Waived: 32 U/L (ref 10–47)
AST (SGOT) Piccolo, Waived: 28 U/L (ref 11–38)
Chol/HDL Ratio Piccolo,Waive: 4 mg/dL
Cholesterol Piccolo, Waived: 200 mg/dL — ABNORMAL HIGH (ref <200)
HDL Chol Piccolo, Waived: 50 mg/dL — ABNORMAL LOW (ref >59)
LDL Chol Calc Piccolo Waived: 78 mg/dL (ref <100)
Triglycerides Piccolo,Waived: 360 mg/dL — ABNORMAL HIGH (ref <150)
VLDL Chol Calc Piccolo,Waive: 72 mg/dL — ABNORMAL HIGH (ref <30)

## 2016-12-05 NOTE — Progress Notes (Signed)
BP 120/76   Pulse 82   Wt 217 lb (98.4 kg)   SpO2 98%   BMI 31.81 kg/m    Subjective:    Patient ID: Bill Aguilar, male    DOB: 1946-04-26, 71 y.o.   MRN: [id number]  HPI: Bill Aguilar is a 71 y.o. male  Patient follow-up all in all doing well working with nephrology and neurology for kidney status and myasthenia gravis which are doing stable back on CellCept. Cholesterol no issues with medications takes faithfully. Same for Benzapril for blood pressure and thyroid medicine for low thyroid. Patient with no symptoms or side effects.  Relevant past medical, surgical, family and social history reviewed and updated as indicated. Interim medical history since our last visit reviewed. Allergies and medications reviewed and updated.  Review of Systems  Constitutional: Negative.   Respiratory: Negative.   Cardiovascular: Negative.     Per HPI unless specifically indicated above     Objective:    BP 120/76   Pulse 82   Wt 217 lb (98.4 kg)   SpO2 98%   BMI 31.81 kg/m   Wt Readings from Last 3 Encounters:  12/05/16 217 lb (98.4 kg)  10/29/16 215 lb 9.6 oz (97.8 kg)  06/06/16 209 lb 3.2 oz (94.9 kg)    Physical Exam  Constitutional: He is oriented to person, place, and time. He appears well-developed and well-nourished.  HENT:  Head: Normocephalic and atraumatic.  Eyes: Conjunctivae and EOM are normal.  Neck: Normal range of motion.  Cardiovascular: Normal rate, regular rhythm and normal heart sounds.   Pulmonary/Chest: Effort normal and breath sounds normal.  Musculoskeletal: Normal range of motion.  Neurological: He is alert and oriented to person, place, and time.  Skin: No erythema.  Psychiatric: He has a normal mood and affect. His behavior is normal. Judgment and thought content normal.    Results for orders placed or performed in visit on 06/06/16  Comprehensive metabolic panel  Result Value Ref Range   Glucose 99 65 - 99 mg/dL   BUN 25 8 -  27 mg/dL   Creatinine, Ser 2.36 (H) 0.76 - 1.27 mg/dL   GFR calc non Af Amer 27 (L) >59 mL/min/1.73   GFR calc Af Amer 31 (L) >59 mL/min/1.73   BUN/Creatinine Ratio 11 10 - 24   Sodium 143 134 - 144 mmol/L   Potassium 4.6 3.5 - 5.2 mmol/L   Chloride 100 96 - 106 mmol/L   CO2 23 18 - 29 mmol/L   Calcium 9.9 8.6 - 10.2 mg/dL   Total Protein 6.5 6.0 - 8.5 g/dL   Albumin 4.1 3.5 - 4.8 g/dL   Globulin, Total 2.4 1.5 - 4.5 g/dL   Albumin/Globulin Ratio 1.7 1.2 - 2.2   Bilirubin Total 0.3 0.0 - 1.2 mg/dL   Alkaline Phosphatase 188 (H) 39 - 117 IU/L   AST 46 (H) 0 - 40 IU/L   ALT 69 (H) 0 - 44 IU/L  Lipid panel  Result Value Ref Range   Cholesterol, Total 192 100 - 199 mg/dL   Triglycerides 468 (H) 0 - 149 mg/dL   HDL 41 >39 mg/dL   VLDL Cholesterol Cal Comment 5 - 40 mg/dL   LDL Calculated Comment 0 - 99 mg/dL   Chol/HDL Ratio 4.7 0.0 - 5.0 ratio units  CBC with Differential/Platelet  Result Value Ref Range   WBC 6.8 3.4 - 10.8 x10E3/uL   RBC 4.92 4.14 - 5.80 x10E6/uL   Hemoglobin 16.0  13.0 - 17.7 g/dL   Hematocrit 46.2 37.5 - 51.0 %   MCV 94 79 - 97 fL   MCH 32.5 26.6 - 33.0 pg   MCHC 34.6 31.5 - 35.7 g/dL   RDW 15.1 12.3 - 15.4 %   Platelets 215 150 - 379 x10E3/uL   Neutrophils 59 Not Estab. %   Lymphs 26 Not Estab. %   Monocytes 11 Not Estab. %   Eos 3 Not Estab. %   Basos 0 Not Estab. %   Neutrophils Absolute 4.0 1.4 - 7.0 x10E3/uL   Lymphocytes Absolute 1.8 0.7 - 3.1 x10E3/uL   Monocytes Absolute 0.7 0.1 - 0.9 x10E3/uL   EOS (ABSOLUTE) 0.2 0.0 - 0.4 x10E3/uL   Basophils Absolute 0.0 0.0 - 0.2 x10E3/uL   Immature Granulocytes 1 Not Estab. %   Immature Grans (Abs) 0.1 0.0 - 0.1 x10E3/uL  TSH  Result Value Ref Range   TSH 1.720 0.450 - 4.500 uIU/mL  PSA  Result Value Ref Range   Prostate Specific Ag, Serum 4.8 (H) 0.0 - 4.0 ng/mL      Assessment & Plan:   Problem List Items Addressed This Visit      Cardiovascular and Mediastinum   Essential hypertension -  Primary    The current medical regimen is effective;  continue present plan and medications.       Relevant Orders   LP+ALT+AST Piccolo, Waived     Nervous and Auditory   Myasthenia gravis ([place])    Followed by neurology at the [facility] and stable        Genitourinary   Membranous glomerulonephritis, stage 4    Followed by nephrology and stable        Other   Hyperlipidemia    The current medical regimen is effective;  continue present plan and medications.       Relevant Orders   LP+ALT+AST Piccolo, Waived       Follow up plan: Return in about 6 months (around 06/06/2017) for Physical Exam.

## 2016-12-05 NOTE — Assessment & Plan Note (Signed)
The current medical regimen is effective;  continue present plan and medications.  

## 2016-12-05 NOTE — Assessment & Plan Note (Signed)
Followed by nephrology and stable 

## 2016-12-05 NOTE — Assessment & Plan Note (Signed)
Followed by neurology at the [facility] and stable

## 2016-12-12 DIAGNOSIS — N050 Unspecified nephritic syndrome with minor glomerular abnormality: Secondary | ICD-10-CM | POA: Diagnosis not present

## 2016-12-12 DIAGNOSIS — E875 Hyperkalemia: Secondary | ICD-10-CM | POA: Diagnosis not present

## 2016-12-12 DIAGNOSIS — N183 Chronic kidney disease, stage 3 (moderate): Secondary | ICD-10-CM | POA: Diagnosis not present

## 2016-12-12 DIAGNOSIS — I10 Essential (primary) hypertension: Secondary | ICD-10-CM | POA: Diagnosis not present

## 2016-12-12 DIAGNOSIS — N052 Unspecified nephritic syndrome with diffuse membranous glomerulonephritis: Secondary | ICD-10-CM | POA: Diagnosis not present

## 2016-12-12 DIAGNOSIS — R809 Proteinuria, unspecified: Secondary | ICD-10-CM | POA: Diagnosis not present

## 2016-12-12 DIAGNOSIS — N049 Nephrotic syndrome with unspecified morphologic changes: Secondary | ICD-10-CM | POA: Diagnosis not present

## 2016-12-16 DIAGNOSIS — R809 Proteinuria, unspecified: Secondary | ICD-10-CM | POA: Diagnosis not present

## 2016-12-16 DIAGNOSIS — I129 Hypertensive chronic kidney disease with stage 1 through stage 4 chronic kidney disease, or unspecified chronic kidney disease: Secondary | ICD-10-CM | POA: Diagnosis not present

## 2016-12-16 DIAGNOSIS — N052 Unspecified nephritic syndrome with diffuse membranous glomerulonephritis: Secondary | ICD-10-CM | POA: Diagnosis not present

## 2016-12-16 DIAGNOSIS — N184 Chronic kidney disease, stage 4 (severe): Secondary | ICD-10-CM | POA: Diagnosis not present

## 2016-12-16 DIAGNOSIS — N2581 Secondary hyperparathyroidism of renal origin: Secondary | ICD-10-CM | POA: Diagnosis not present

## 2017-01-29 DIAGNOSIS — I10 Essential (primary) hypertension: Secondary | ICD-10-CM | POA: Diagnosis not present

## 2017-01-29 DIAGNOSIS — N183 Chronic kidney disease, stage 3 (moderate): Secondary | ICD-10-CM | POA: Diagnosis not present

## 2017-01-29 DIAGNOSIS — R809 Proteinuria, unspecified: Secondary | ICD-10-CM | POA: Diagnosis not present

## 2017-01-29 DIAGNOSIS — N052 Unspecified nephritic syndrome with diffuse membranous glomerulonephritis: Secondary | ICD-10-CM | POA: Diagnosis not present

## 2017-01-29 DIAGNOSIS — E785 Hyperlipidemia, unspecified: Secondary | ICD-10-CM | POA: Diagnosis not present

## 2017-01-29 DIAGNOSIS — N050 Unspecified nephritic syndrome with minor glomerular abnormality: Secondary | ICD-10-CM | POA: Diagnosis not present

## 2017-02-18 ENCOUNTER — Telehealth: Payer: Self-pay | Admitting: Family Medicine

## 2017-02-18 DIAGNOSIS — B353 Tinea pedis: Secondary | ICD-10-CM | POA: Diagnosis not present

## 2017-02-18 NOTE — Telephone Encounter (Signed)
Pt called and stated that the dermatologist gave him terbinafine and he would like to speak with someone before taking this medication.

## 2017-02-19 NOTE — Telephone Encounter (Signed)
Pt has kidney disease and it is advised that medication can cause kidney issues. Pt sees a nephrologist, advised to call him.

## 2017-02-19 NOTE — Telephone Encounter (Signed)
Per [facility], "Using this medicine with any of the following medicines is usually not recommended, but may be required in some cases. If both medicines are prescribed together, your doctor may change the dose or how often you use one or both of the medicines.  Brexpiprazole, Clozapine, Donepezil,  Doxorubicin, Doxorubicin Hydrochloride, Liposome, Eliglustat, Fluoxetine, Metoprolol, Nebivolol, cyclosporine, Nortriptyline, Warfarin"  Pt is not on any of these medications.   "Per [facility], side effects that may occur:  More common:  Fever, Less common, Body aches or pain, chills, cough, diarrhea, difficulty with breathing, ear congestion, general feeling of discomfort or illness, headache, joint pain, loss of appetite, loss of voice, nasal congestion, Nausea, runny nose, shivering, skin rash or itching, sneezing, sore throat, sweating, trouble with sleeping, unusual tiredness or weakness, upper abdominal or stomach pain, vomiting,  Rare: Dark urine ,difficulty with swallowing, pale skin, pale stools, redness, blistering, peeling, or loosening of the skin, stomach pain, unusual bleeding or bruising, yellow skin or eyes"

## 2017-03-21 DIAGNOSIS — E875 Hyperkalemia: Secondary | ICD-10-CM | POA: Diagnosis not present

## 2017-03-21 DIAGNOSIS — N183 Chronic kidney disease, stage 3 (moderate): Secondary | ICD-10-CM | POA: Diagnosis not present

## 2017-03-21 DIAGNOSIS — N020 Recurrent and persistent hematuria with minor glomerular abnormality: Secondary | ICD-10-CM | POA: Diagnosis not present

## 2017-03-21 DIAGNOSIS — I10 Essential (primary) hypertension: Secondary | ICD-10-CM | POA: Diagnosis not present

## 2017-03-21 DIAGNOSIS — R809 Proteinuria, unspecified: Secondary | ICD-10-CM | POA: Diagnosis not present

## 2017-03-21 DIAGNOSIS — N049 Nephrotic syndrome with unspecified morphologic changes: Secondary | ICD-10-CM | POA: Diagnosis not present

## 2017-03-26 DIAGNOSIS — N052 Unspecified nephritic syndrome with diffuse membranous glomerulonephritis: Secondary | ICD-10-CM | POA: Diagnosis not present

## 2017-03-26 DIAGNOSIS — N184 Chronic kidney disease, stage 4 (severe): Secondary | ICD-10-CM | POA: Diagnosis not present

## 2017-03-26 DIAGNOSIS — N2581 Secondary hyperparathyroidism of renal origin: Secondary | ICD-10-CM | POA: Diagnosis not present

## 2017-03-26 DIAGNOSIS — R809 Proteinuria, unspecified: Secondary | ICD-10-CM | POA: Diagnosis not present

## 2017-03-26 DIAGNOSIS — I10 Essential (primary) hypertension: Secondary | ICD-10-CM | POA: Diagnosis not present

## 2017-05-09 DIAGNOSIS — E875 Hyperkalemia: Secondary | ICD-10-CM | POA: Diagnosis not present

## 2017-05-09 DIAGNOSIS — N049 Nephrotic syndrome with unspecified morphologic changes: Secondary | ICD-10-CM | POA: Diagnosis not present

## 2017-05-09 DIAGNOSIS — R809 Proteinuria, unspecified: Secondary | ICD-10-CM | POA: Diagnosis not present

## 2017-05-09 DIAGNOSIS — I10 Essential (primary) hypertension: Secondary | ICD-10-CM | POA: Diagnosis not present

## 2017-05-09 DIAGNOSIS — N183 Chronic kidney disease, stage 3 (moderate): Secondary | ICD-10-CM | POA: Diagnosis not present

## 2017-05-09 DIAGNOSIS — N050 Unspecified nephritic syndrome with minor glomerular abnormality: Secondary | ICD-10-CM | POA: Diagnosis not present

## 2017-06-06 DIAGNOSIS — L538 Other specified erythematous conditions: Secondary | ICD-10-CM | POA: Diagnosis not present

## 2017-06-06 DIAGNOSIS — D0461 Carcinoma in situ of skin of right upper limb, including shoulder: Secondary | ICD-10-CM | POA: Diagnosis not present

## 2017-06-06 DIAGNOSIS — C44619 Basal cell carcinoma of skin of left upper limb, including shoulder: Secondary | ICD-10-CM | POA: Diagnosis not present

## 2017-06-06 DIAGNOSIS — Z08 Encounter for follow-up examination after completed treatment for malignant neoplasm: Secondary | ICD-10-CM | POA: Diagnosis not present

## 2017-06-06 DIAGNOSIS — D0462 Carcinoma in situ of skin of left upper limb, including shoulder: Secondary | ICD-10-CM | POA: Diagnosis not present

## 2017-06-06 DIAGNOSIS — X32XXXA Exposure to sunlight, initial encounter: Secondary | ICD-10-CM | POA: Diagnosis not present

## 2017-06-06 DIAGNOSIS — D485 Neoplasm of uncertain behavior of skin: Secondary | ICD-10-CM | POA: Diagnosis not present

## 2017-06-06 DIAGNOSIS — Z85820 Personal history of malignant melanoma of skin: Secondary | ICD-10-CM | POA: Diagnosis not present

## 2017-06-06 DIAGNOSIS — L570 Actinic keratosis: Secondary | ICD-10-CM | POA: Diagnosis not present

## 2017-06-06 DIAGNOSIS — L728 Other follicular cysts of the skin and subcutaneous tissue: Secondary | ICD-10-CM | POA: Diagnosis not present

## 2017-06-06 DIAGNOSIS — Z85828 Personal history of other malignant neoplasm of skin: Secondary | ICD-10-CM | POA: Diagnosis not present

## 2017-06-09 ENCOUNTER — Encounter: Payer: Self-pay | Admitting: Family Medicine

## 2017-06-09 ENCOUNTER — Ambulatory Visit (INDEPENDENT_AMBULATORY_CARE_PROVIDER_SITE_OTHER): Payer: PPO | Admitting: Family Medicine

## 2017-06-09 VITALS — BP 121/75 | HR 81 | Ht 69.69 in | Wt 227.0 lb

## 2017-06-09 DIAGNOSIS — I10 Essential (primary) hypertension: Secondary | ICD-10-CM

## 2017-06-09 DIAGNOSIS — E7800 Pure hypercholesterolemia, unspecified: Secondary | ICD-10-CM | POA: Diagnosis not present

## 2017-06-09 DIAGNOSIS — E661 Drug-induced obesity: Secondary | ICD-10-CM

## 2017-06-09 DIAGNOSIS — Z0001 Encounter for general adult medical examination with abnormal findings: Secondary | ICD-10-CM

## 2017-06-09 DIAGNOSIS — E039 Hypothyroidism, unspecified: Secondary | ICD-10-CM

## 2017-06-09 DIAGNOSIS — Z6832 Body mass index (BMI) 32.0-32.9, adult: Secondary | ICD-10-CM | POA: Diagnosis not present

## 2017-06-09 DIAGNOSIS — N400 Enlarged prostate without lower urinary tract symptoms: Secondary | ICD-10-CM | POA: Diagnosis not present

## 2017-06-09 DIAGNOSIS — I129 Hypertensive chronic kidney disease with stage 1 through stage 4 chronic kidney disease, or unspecified chronic kidney disease: Secondary | ICD-10-CM

## 2017-06-09 DIAGNOSIS — Z7189 Other specified counseling: Secondary | ICD-10-CM

## 2017-06-09 DIAGNOSIS — N184 Chronic kidney disease, stage 4 (severe): Secondary | ICD-10-CM

## 2017-06-09 DIAGNOSIS — N052 Unspecified nephritic syndrome with diffuse membranous glomerulonephritis: Secondary | ICD-10-CM

## 2017-06-09 DIAGNOSIS — G7000 Myasthenia gravis without (acute) exacerbation: Secondary | ICD-10-CM

## 2017-06-09 DIAGNOSIS — Z0000 Encounter for general adult medical examination without abnormal findings: Secondary | ICD-10-CM

## 2017-06-09 LAB — URINALYSIS, ROUTINE W REFLEX MICROSCOPIC
Bilirubin, UA: NEGATIVE
Ketones, UA: NEGATIVE
Leukocytes, UA: NEGATIVE
Nitrite, UA: NEGATIVE
Specific Gravity, UA: 1.02 (ref 1.005–1.030)
Urobilinogen, Ur: 0.2 mg/dL (ref 0.2–1.0)
pH, UA: 6 (ref 5.0–7.5)

## 2017-06-09 LAB — MICROSCOPIC EXAMINATION: Bacteria, UA: NONE SEEN

## 2017-06-09 MED ORDER — LEVOTHYROXINE SODIUM 175 MCG PO TABS: 175.0000 ug | ORAL_TABLET | Freq: Every day | ORAL | 4 refills | Status: DC

## 2017-06-09 MED ORDER — BENAZEPRIL HCL 20 MG PO TABS: 20.0000 mg | ORAL_TABLET | Freq: Every day | ORAL | 4 refills | Status: DC

## 2017-06-09 MED ORDER — ATORVASTATIN CALCIUM 40 MG PO TABS: 40.0000 mg | ORAL_TABLET | Freq: Every day | ORAL | 4 refills | Status: DC

## 2017-06-09 NOTE — Assessment & Plan Note (Signed)
Discussed care and tx but worse with Celcept

## 2017-06-09 NOTE — Assessment & Plan Note (Signed)
The current medical regimen is effective;  continue present plan and medications.  

## 2017-06-09 NOTE — Assessment & Plan Note (Signed)
[facility] which helps with both kidneys and myasthenia.

## 2017-06-09 NOTE — Assessment & Plan Note (Signed)
A voluntary discussion about advance care planning including the explanation and discussion of advance directives was extensively discussed  with the patient.  Explanation about the health care proxy and Living will was reviewed and packet with forms with explanation of how to fill them out was given.    

## 2017-06-09 NOTE — Assessment & Plan Note (Signed)
Stable from kidney Dr

## 2017-06-09 NOTE — Progress Notes (Addendum)
BP 121/75   Pulse 81   Ht 5' 9.69" (1.77 m)   Wt 227 lb (103 kg)   SpO2 99%   BMI 32.87 kg/m    Subjective:    Patient ID: Bill Aguilar, male    DOB: 11/26/1945, 71 y.o.   MRN: [id number]  HPI: Bill Aguilar is a 71 y.o. male  Chief Complaint  Patient presents with  . Annual Exam  Patient all in all doing well macular nephritis doing much better is graduated to every 30-month doctor visits and medication working well. Patient did not wear opportunity CellCept is working both for myasthenia and his kidneys and doing well. Other medical issues hypertension, hypothyroid and cholesterol all doing well without issues.  Taking medications without problems.   Relevant past medical, surgical, family and social history reviewed and updated as indicated. Interim medical history since our last visit reviewed. Allergies and medications reviewed and updated.  Review of Systems  Constitutional: Negative.   HENT: Negative.   Eyes: Negative.   Respiratory: Negative.   Cardiovascular: Negative.   Gastrointestinal: Negative.   Endocrine: Negative.   Genitourinary: Negative.   Musculoskeletal: Negative.   Skin: Negative.   Allergic/Immunologic: Negative.   Neurological: Negative.   Hematological: Negative.   Psychiatric/Behavioral: Negative.     Per HPI unless specifically indicated above     Objective:    BP 121/75   Pulse 81   Ht 5' 9.69" (1.77 m)   Wt 227 lb (103 kg)   SpO2 99%   BMI 32.87 kg/m   Wt Readings from Last 3 Encounters:  06/09/17 227 lb (103 kg)  12/05/16 217 lb (98.4 kg)  10/29/16 215 lb 9.6 oz (97.8 kg)    Physical Exam  Constitutional: He is oriented to person, place, and time. He appears well-developed and well-nourished.  HENT:  Head: Normocephalic and atraumatic.  Right Ear: External ear normal.  Left Ear: External ear normal.  Eyes: Conjunctivae and EOM are normal. Pupils are equal, round, and reactive to light.  Neck: Normal  range of motion. Neck supple.  Cardiovascular: Normal rate, regular rhythm, normal heart sounds and intact distal pulses.  Pulmonary/Chest: Effort normal and breath sounds normal.  Abdominal: Soft. Bowel sounds are normal. There is no splenomegaly or hepatomegaly.  Genitourinary: Rectum normal and penis normal.  Genitourinary Comments: BPH  Musculoskeletal: Normal range of motion.  Neurological: He is alert and oriented to person, place, and time. He has normal reflexes.  Skin: No rash noted. No erythema.  Psychiatric: He has a normal mood and affect. His behavior is normal. Judgment and thought content normal.    Results for orders placed or performed in visit on 12/05/16  LP+ALT+AST Piccolo, [employer]  Result Value Ref Range   ALT (SGPT) Piccolo, Waived 32 10 - 47 U/L   AST (SGOT) Piccolo, Waived 28 11 - 38 U/L   Cholesterol Piccolo, Waived 200 (H) <200 mg/dL   HDL Chol Piccolo, Waived 50 (L) >59 mg/dL   Triglycerides Piccolo,Waived 360 (H) <150 mg/dL   Chol/HDL Ratio Piccolo,Waive 4.0 mg/dL   LDL Chol Calc Piccolo Waived 78 <100 mg/dL   VLDL Chol Calc Piccolo,Waive 72 (H) <30 mg/dL      Assessment & Plan:   Problem List Items Addressed This Visit      Cardiovascular and Mediastinum   Essential hypertension - Primary    The current medical regimen is effective;  continue present plan and medications.       Relevant  Medications   atorvastatin (LIPITOR) 40 MG tablet   benazepril (LOTENSIN) 20 MG tablet   Other Relevant Orders   CBC with Differential/Platelet   Urinalysis, Routine w reflex microscopic     Endocrine   Hypothyroidism    The current medical regimen is effective;  continue present plan and medications.       Relevant Medications   levothyroxine (SYNTHROID, LEVOTHROID) 175 MCG tablet   Other Relevant Orders   TSH     Nervous and Auditory   Myasthenia gravis ([place])    [facility] which helps with both kidneys and myasthenia.          Genitourinary   Chronic kidney disease   Relevant Orders   Comprehensive metabolic panel   Membranous glomerulonephritis, stage 4    Stable from kidney Dr      BPH (benign prostatic hyperplasia)   Relevant Orders   PSA     Other   Hyperlipidemia    The current medical regimen is effective;  continue present plan and medications.       Relevant Medications   atorvastatin (LIPITOR) 40 MG tablet   benazepril (LOTENSIN) 20 MG tablet   Other Relevant Orders   Lipid panel   Advanced care planning/counseling discussion    A voluntary discussion about advance care planning including the explanation and discussion of advance directives was extensively discussed  with the patient.  Explanation about the health care proxy and Living will was reviewed and packet with forms with explanation of how to fill them out was given.        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

## 2017-06-10 ENCOUNTER — Encounter: Payer: Self-pay | Admitting: Family Medicine

## 2017-06-10 LAB — CBC WITH DIFFERENTIAL/PLATELET
Basophils Absolute: 0 10*3/uL (ref 0.0–0.2)
Basos: 0 %
EOS (ABSOLUTE): 0.2 10*3/uL (ref 0.0–0.4)
Eos: 4 %
Hematocrit: 47.9 % (ref 37.5–51.0)
Hemoglobin: 15.6 g/dL (ref 13.0–17.7)
Immature Grans (Abs): 0.1 10*3/uL (ref 0.0–0.1)
Immature Granulocytes: 1 %
Lymphocytes Absolute: 1.2 10*3/uL (ref 0.7–3.1)
Lymphs: 21 %
MCH: 29.6 pg (ref 26.6–33.0)
MCHC: 32.6 g/dL (ref 31.5–35.7)
MCV: 91 fL (ref 79–97)
Monocytes Absolute: 0.7 10*3/uL (ref 0.1–0.9)
Monocytes: 12 %
Neutrophils Absolute: 3.4 10*3/uL (ref 1.4–7.0)
Neutrophils: 62 %
Platelets: 202 10*3/uL (ref 150–379)
RBC: 5.27 x10E6/uL (ref 4.14–5.80)
RDW: 14.1 % (ref 12.3–15.4)
WBC: 5.4 10*3/uL (ref 3.4–10.8)

## 2017-06-10 LAB — COMPREHENSIVE METABOLIC PANEL
ALT: 28 IU/L (ref 0–44)
AST: 21 IU/L (ref 0–40)
Albumin/Globulin Ratio: 2.4 — ABNORMAL HIGH (ref 1.2–2.2)
Albumin: 4.6 g/dL (ref 3.5–4.8)
Alkaline Phosphatase: 93 IU/L (ref 39–117)
BUN/Creatinine Ratio: 9 — ABNORMAL LOW (ref 10–24)
BUN: 25 mg/dL (ref 8–27)
Bilirubin Total: 0.4 mg/dL (ref 0.0–1.2)
CO2: 23 mmol/L (ref 20–29)
Calcium: 9.2 mg/dL (ref 8.6–10.2)
Chloride: 103 mmol/L (ref 96–106)
Creatinine, Ser: 2.78 mg/dL — ABNORMAL HIGH (ref 0.76–1.27)
GFR calc Af Amer: 25 mL/min/{1.73_m2} — ABNORMAL LOW (ref >59)
GFR calc non Af Amer: 22 mL/min/{1.73_m2} — ABNORMAL LOW (ref >59)
Globulin, Total: 1.9 g/dL (ref 1.5–4.5)
Glucose: 103 mg/dL — ABNORMAL HIGH (ref 65–99)
Potassium: 4.6 mmol/L (ref 3.5–5.2)
Sodium: 141 mmol/L (ref 134–144)
Total Protein: 6.5 g/dL (ref 6.0–8.5)

## 2017-06-10 LAB — LIPID PANEL
Chol/HDL Ratio: 5.2 ratio — ABNORMAL HIGH (ref 0.0–5.0)
Cholesterol, Total: 188 mg/dL (ref 100–199)
HDL: 36 mg/dL — ABNORMAL LOW (ref >39)
LDL Calculated: 86 mg/dL (ref 0–99)
Triglycerides: 332 mg/dL — ABNORMAL HIGH (ref 0–149)
VLDL Cholesterol Cal: 66 mg/dL — ABNORMAL HIGH (ref 5–40)

## 2017-06-10 LAB — TSH: TSH: 1.76 u[IU]/mL (ref 0.450–4.500)

## 2017-06-10 LAB — PSA: Prostate Specific Ag, Serum: 3.1 ng/mL (ref 0.0–4.0)

## 2017-06-23 DIAGNOSIS — D0461 Carcinoma in situ of skin of right upper limb, including shoulder: Secondary | ICD-10-CM | POA: Diagnosis not present

## 2017-06-23 DIAGNOSIS — C44619 Basal cell carcinoma of skin of left upper limb, including shoulder: Secondary | ICD-10-CM | POA: Diagnosis not present

## 2017-06-23 DIAGNOSIS — D0462 Carcinoma in situ of skin of left upper limb, including shoulder: Secondary | ICD-10-CM | POA: Diagnosis not present

## 2017-07-09 DIAGNOSIS — E875 Hyperkalemia: Secondary | ICD-10-CM | POA: Diagnosis not present

## 2017-07-09 DIAGNOSIS — N049 Nephrotic syndrome with unspecified morphologic changes: Secondary | ICD-10-CM | POA: Diagnosis not present

## 2017-07-09 DIAGNOSIS — N183 Chronic kidney disease, stage 3 (moderate): Secondary | ICD-10-CM | POA: Diagnosis not present

## 2017-07-09 DIAGNOSIS — N050 Unspecified nephritic syndrome with minor glomerular abnormality: Secondary | ICD-10-CM | POA: Diagnosis not present

## 2017-07-09 DIAGNOSIS — I10 Essential (primary) hypertension: Secondary | ICD-10-CM | POA: Diagnosis not present

## 2017-07-09 DIAGNOSIS — R809 Proteinuria, unspecified: Secondary | ICD-10-CM | POA: Diagnosis not present

## 2017-07-11 DIAGNOSIS — I10 Essential (primary) hypertension: Secondary | ICD-10-CM | POA: Diagnosis not present

## 2017-07-11 DIAGNOSIS — N052 Unspecified nephritic syndrome with diffuse membranous glomerulonephritis: Secondary | ICD-10-CM | POA: Diagnosis not present

## 2017-07-11 DIAGNOSIS — R809 Proteinuria, unspecified: Secondary | ICD-10-CM | POA: Diagnosis not present

## 2017-07-11 DIAGNOSIS — N184 Chronic kidney disease, stage 4 (severe): Secondary | ICD-10-CM | POA: Diagnosis not present

## 2017-08-05 ENCOUNTER — Telehealth: Payer: Self-pay | Admitting: Family Medicine

## 2017-08-05 NOTE — Telephone Encounter (Signed)
Copied from [place] [id number]. Topic: Quick Communication - See Telephone Encounter >> Aug 05, 2017  8:41 AM Ahmed Prima L wrote: CRM for notification. See Telephone encounter for:   08/05/17.  Marland Kitchen Pt said that his levothyroxine (SYNTHROID, LEVOTHROID) 175 MCG tablet, the pharmacy is out of this medicine and they need approval from Dr Jeananne Rama so they could change manufacture's. [place], [place]

## 2017-08-05 NOTE — Telephone Encounter (Signed)
Message relayed to pharmacist.

## 2017-08-05 NOTE — Telephone Encounter (Signed)
Synthroid 175 mcg.  His pharmacy is out of this medicine and need approval from Dr. Jeananne Rama so they can change manufacturer. [place], [place].  See attached

## 2017-08-05 NOTE — Telephone Encounter (Signed)
ok 

## 2017-08-21 DIAGNOSIS — N050 Unspecified nephritic syndrome with minor glomerular abnormality: Secondary | ICD-10-CM | POA: Diagnosis not present

## 2017-08-21 DIAGNOSIS — I10 Essential (primary) hypertension: Secondary | ICD-10-CM | POA: Diagnosis not present

## 2017-08-21 DIAGNOSIS — N052 Unspecified nephritic syndrome with diffuse membranous glomerulonephritis: Secondary | ICD-10-CM | POA: Diagnosis not present

## 2017-08-21 DIAGNOSIS — R809 Proteinuria, unspecified: Secondary | ICD-10-CM | POA: Diagnosis not present

## 2017-08-21 DIAGNOSIS — N183 Chronic kidney disease, stage 3 (moderate): Secondary | ICD-10-CM | POA: Diagnosis not present

## 2017-08-21 DIAGNOSIS — E875 Hyperkalemia: Secondary | ICD-10-CM | POA: Diagnosis not present

## 2017-08-21 DIAGNOSIS — N049 Nephrotic syndrome with unspecified morphologic changes: Secondary | ICD-10-CM | POA: Diagnosis not present

## 2017-09-30 ENCOUNTER — Telehealth: Payer: Self-pay | Admitting: Family Medicine

## 2017-09-30 NOTE — Telephone Encounter (Signed)
Patient is requesting a letter documenting when the patient was diagnosed by Dr Jeananne Rama with hypertension and when he was started on medication. He is trying to obtain some benefits from the [place].  He can be reached at [phone]  Thank you

## 2017-09-30 NOTE — Telephone Encounter (Signed)
Note in out box on my desk

## 2017-10-02 NOTE — Telephone Encounter (Signed)
Notified Patient, he came to pick up letter.

## 2017-10-06 DIAGNOSIS — N052 Unspecified nephritic syndrome with diffuse membranous glomerulonephritis: Secondary | ICD-10-CM | POA: Diagnosis not present

## 2017-10-06 DIAGNOSIS — I10 Essential (primary) hypertension: Secondary | ICD-10-CM | POA: Diagnosis not present

## 2017-10-06 DIAGNOSIS — E875 Hyperkalemia: Secondary | ICD-10-CM | POA: Diagnosis not present

## 2017-10-06 DIAGNOSIS — E785 Hyperlipidemia, unspecified: Secondary | ICD-10-CM | POA: Diagnosis not present

## 2017-10-06 DIAGNOSIS — R809 Proteinuria, unspecified: Secondary | ICD-10-CM | POA: Diagnosis not present

## 2017-10-06 DIAGNOSIS — N050 Unspecified nephritic syndrome with minor glomerular abnormality: Secondary | ICD-10-CM | POA: Diagnosis not present

## 2017-10-06 DIAGNOSIS — N183 Chronic kidney disease, stage 3 (moderate): Secondary | ICD-10-CM | POA: Diagnosis not present

## 2017-10-06 DIAGNOSIS — N049 Nephrotic syndrome with unspecified morphologic changes: Secondary | ICD-10-CM | POA: Diagnosis not present

## 2017-10-06 DIAGNOSIS — N184 Chronic kidney disease, stage 4 (severe): Secondary | ICD-10-CM | POA: Diagnosis not present

## 2017-10-07 ENCOUNTER — Telehealth: Payer: Self-pay | Admitting: Family Medicine

## 2017-10-07 NOTE — Telephone Encounter (Signed)
He will need to wait for Dr. Jeananne Rama to return for this. Please let him know that Dr. Jeananne Rama is out of the office until Monday. Then please send back so that we can leave this un his box until he returns.

## 2017-10-07 NOTE — Telephone Encounter (Signed)
Patient came to pick up the letter regarding his hypertension for the [place] but the information he received was not correct.  He needs a letter with when he was diagnosed with hypertension and how long he has been treated with medication for this.  [place] [phone]  Thanks

## 2017-10-07 NOTE — Telephone Encounter (Signed)
Patient has an appointment at the [facility] on 4/23 and would really like to be able to take this letter with him to that appointment.  I explained that Dr Jeananne Rama is away until Monday but I would definitely send this message.

## 2017-10-09 DIAGNOSIS — N052 Unspecified nephritic syndrome with diffuse membranous glomerulonephritis: Secondary | ICD-10-CM | POA: Diagnosis not present

## 2017-10-09 DIAGNOSIS — R809 Proteinuria, unspecified: Secondary | ICD-10-CM | POA: Diagnosis not present

## 2017-10-09 DIAGNOSIS — I10 Essential (primary) hypertension: Secondary | ICD-10-CM | POA: Diagnosis not present

## 2017-10-09 DIAGNOSIS — N2581 Secondary hyperparathyroidism of renal origin: Secondary | ICD-10-CM | POA: Diagnosis not present

## 2017-10-09 DIAGNOSIS — N184 Chronic kidney disease, stage 4 (severe): Secondary | ICD-10-CM | POA: Diagnosis not present

## 2017-10-12 NOTE — Telephone Encounter (Signed)
Call pt 

## 2017-10-13 NOTE — Telephone Encounter (Signed)
Patient was transferred to provider for telephone conversation.   

## 2017-11-18 DIAGNOSIS — Z08 Encounter for follow-up examination after completed treatment for malignant neoplasm: Secondary | ICD-10-CM | POA: Diagnosis not present

## 2017-11-18 DIAGNOSIS — L570 Actinic keratosis: Secondary | ICD-10-CM | POA: Diagnosis not present

## 2017-11-18 DIAGNOSIS — D485 Neoplasm of uncertain behavior of skin: Secondary | ICD-10-CM | POA: Diagnosis not present

## 2017-11-18 DIAGNOSIS — X32XXXA Exposure to sunlight, initial encounter: Secondary | ICD-10-CM | POA: Diagnosis not present

## 2017-11-18 DIAGNOSIS — C44612 Basal cell carcinoma of skin of right upper limb, including shoulder: Secondary | ICD-10-CM | POA: Diagnosis not present

## 2017-11-18 DIAGNOSIS — Z85828 Personal history of other malignant neoplasm of skin: Secondary | ICD-10-CM | POA: Diagnosis not present

## 2017-11-18 DIAGNOSIS — Z85820 Personal history of malignant melanoma of skin: Secondary | ICD-10-CM | POA: Diagnosis not present

## 2017-11-18 DIAGNOSIS — D171 Benign lipomatous neoplasm of skin and subcutaneous tissue of trunk: Secondary | ICD-10-CM | POA: Diagnosis not present

## 2017-11-26 DIAGNOSIS — N052 Unspecified nephritic syndrome with diffuse membranous glomerulonephritis: Secondary | ICD-10-CM | POA: Diagnosis not present

## 2017-11-26 DIAGNOSIS — I10 Essential (primary) hypertension: Secondary | ICD-10-CM | POA: Diagnosis not present

## 2017-11-26 DIAGNOSIS — N050 Unspecified nephritic syndrome with minor glomerular abnormality: Secondary | ICD-10-CM | POA: Diagnosis not present

## 2017-11-26 DIAGNOSIS — N049 Nephrotic syndrome with unspecified morphologic changes: Secondary | ICD-10-CM | POA: Diagnosis not present

## 2017-12-08 ENCOUNTER — Ambulatory Visit: Payer: Self-pay | Admitting: Family Medicine

## 2017-12-15 DIAGNOSIS — C44612 Basal cell carcinoma of skin of right upper limb, including shoulder: Secondary | ICD-10-CM | POA: Diagnosis not present

## 2017-12-18 ENCOUNTER — Ambulatory Visit (INDEPENDENT_AMBULATORY_CARE_PROVIDER_SITE_OTHER): Payer: PPO

## 2017-12-18 VITALS — BP 118/68 | HR 75 | Temp 98.1°F | Resp 16 | Ht 69.0 in | Wt 225.6 lb

## 2017-12-18 DIAGNOSIS — E7800 Pure hypercholesterolemia, unspecified: Secondary | ICD-10-CM

## 2017-12-18 DIAGNOSIS — Z0000 Encounter for general adult medical examination without abnormal findings: Secondary | ICD-10-CM | POA: Diagnosis not present

## 2017-12-18 DIAGNOSIS — I10 Essential (primary) hypertension: Secondary | ICD-10-CM

## 2017-12-18 LAB — LP+ALT+AST PICCOLO, WAIVED
ALT (SGPT) Piccolo, Waived: 47 U/L (ref 10–47)
AST (SGOT) Piccolo, Waived: 29 U/L (ref 11–38)
Cholesterol Piccolo, Waived: 196 mg/dL (ref <200)
Triglycerides Piccolo,Waived: 445 mg/dL — ABNORMAL HIGH (ref <150)

## 2017-12-18 NOTE — Patient Instructions (Signed)
[name] , Thank you for taking time to come for your Medicare Wellness Visit. I appreciate your ongoing commitment to your health goals. Please review the following plan we discussed and let me know if I can assist you in the future.   Screening recommendations/referrals: Colonoscopy: completed 04/11/2014 Recommended yearly ophthalmology/optometry visit for glaucoma screening and checkup Recommended yearly dental visit for hygiene and checkup  Vaccinations: Influenza vaccine: due 02/2018 Pneumococcal vaccine: check with [facility] to see if you have received both vaccines.  Tdap vaccine: due, check with your insurance company for coverage Shingles vaccine: shingrix eligible, check with your insurance company for coverage   Advanced directives: Please bring a copy of your health care power of attorney and living will to the office at your convenience.  Conditions/risks identified: Recommend drinking at least 6-8 glasses of water a day   Next appointment: follow up in one year for your annual wellness exam  Preventive Care 65 Years and Older, Male Preventive care refers to lifestyle choices and visits with your health care provider that can promote health and wellness. What does preventive care include?  A yearly physical exam. This is also called an annual well check.  Dental exams once or twice a year.  Routine eye exams. Ask your health care provider how often you should have your eyes checked.  Personal lifestyle choices, including:  Daily care of your teeth and gums.  Regular physical activity.  Eating a healthy diet.  Avoiding tobacco and drug use.  Limiting alcohol use.  Practicing safe sex.  Taking low doses of aspirin every day.  Taking vitamin and mineral supplements as recommended by your health care provider. What happens during an annual well check? The services and screenings done by your health care provider during your annual well check will depend on your age,  overall health, lifestyle risk factors, and family history of disease. Counseling  Your health care provider may ask you questions about your:  Alcohol use.  Tobacco use.  Drug use.  Emotional well-being.  Home and relationship well-being.  Sexual activity.  Eating habits.  History of falls.  Memory and ability to understand (cognition).  Work and work Statistician. Screening  You may have the following tests or measurements:  Height, weight, and BMI.  Blood pressure.  Lipid and cholesterol levels. These may be checked every 5 years, or more frequently if you are over 37 years old.  Skin check.  Lung cancer screening. You may have this screening every year starting at age 84 if you have a 30-pack-year history of smoking and currently smoke or have quit within the past 15 years.  Fecal occult blood test (FOBT) of the stool. You may have this test every year starting at age 50.  Flexible sigmoidoscopy or colonoscopy. You may have a sigmoidoscopy every 5 years or a colonoscopy every 10 years starting at age 55.  Prostate cancer screening. Recommendations will vary depending on your family history and other risks.  Hepatitis C blood test.  Hepatitis B blood test.  Sexually transmitted disease (STD) testing.  Diabetes screening. This is done by checking your blood sugar (glucose) after you have not eaten for a while (fasting). You may have this done every 1-3 years.  Abdominal aortic aneurysm (AAA) screening. You may need this if you are a current or former smoker.  Osteoporosis. You may be screened starting at age 60 if you are at high risk. Talk with your health care provider about your test results, treatment  options, and if necessary, the need for more tests. Vaccines  Your health care provider may recommend certain vaccines, such as:  Influenza vaccine. This is recommended every year.  Tetanus, diphtheria, and acellular pertussis (Tdap, Td) vaccine. You may  need a Td booster every 10 years.  Zoster vaccine. You may need this after age 37.  Pneumococcal 13-valent conjugate (PCV13) vaccine. One dose is recommended after age 69.  Pneumococcal polysaccharide (PPSV23) vaccine. One dose is recommended after age 28. Talk to your health care provider about which screenings and vaccines you need and how often you need them. This information is not intended to replace advice given to you by your health care provider. Make sure you discuss any questions you have with your health care provider. Document Released: 07/07/2015 Document Revised: 02/28/2016 Document Reviewed: 04/11/2015 Elsevier Interactive Patient Education  2017 [employer] Prevention in the Home Falls can cause injuries. They can happen to people of all ages. There are many things you can do to make your home safe and to help prevent falls. What can I do on the outside of my home?  Regularly fix the edges of walkways and driveways and fix any cracks.  Remove anything that might make you trip as you walk through a door, such as a raised step or threshold.  Trim any bushes or trees on the path to your home.  Use bright outdoor lighting.  Clear any walking paths of anything that might make someone trip, such as rocks or tools.  Regularly check to see if handrails are loose or broken. Make sure that both sides of any steps have handrails.  Any raised decks and porches should have guardrails on the edges.  Have any leaves, snow, or ice cleared regularly.  Use sand or salt on walking paths during winter.  Clean up any spills in your garage right away. This includes oil or grease spills. What can I do in the bathroom?  Use night lights.  Install grab bars by the toilet and in the tub and shower. Do not use towel bars as grab bars.  Use non-skid mats or decals in the tub or shower.  If you need to sit down in the shower, use a plastic, non-slip stool.  Keep the floor  dry. Clean up any water that spills on the floor as soon as it happens.  Remove soap buildup in the tub or shower regularly.  Attach bath mats securely with double-sided non-slip rug tape.  Do not have throw rugs and other things on the floor that can make you trip. What can I do in the bedroom?  Use night lights.  Make sure that you have a light by your bed that is easy to reach.  Do not use any sheets or blankets that are too big for your bed. They should not hang down onto the floor.  Have a firm chair that has side arms. You can use this for support while you get dressed.  Do not have throw rugs and other things on the floor that can make you trip. What can I do in the kitchen?  Clean up any spills right away.  Avoid walking on wet floors.  Keep items that you use a lot in easy-to-reach places.  If you need to reach something above you, use a strong step stool that has a grab bar.  Keep electrical cords out of the way.  Do not use floor polish or wax that makes floors slippery.  If you must use wax, use non-skid floor wax.  Do not have throw rugs and other things on the floor that can make you trip. What can I do with my stairs?  Do not leave any items on the stairs.  Make sure that there are handrails on both sides of the stairs and use them. Fix handrails that are broken or loose. Make sure that handrails are as long as the stairways.  Check any carpeting to make sure that it is firmly attached to the stairs. Fix any carpet that is loose or worn.  Avoid having throw rugs at the top or bottom of the stairs. If you do have throw rugs, attach them to the floor with carpet tape.  Make sure that you have a light switch at the top of the stairs and the bottom of the stairs. If you do not have them, ask someone to add them for you. What else can I do to help prevent falls?  Wear shoes that:  Do not have high heels.  Have rubber bottoms.  Are comfortable and fit you  well.  Are closed at the toe. Do not wear sandals.  If you use a stepladder:  Make sure that it is fully opened. Do not climb a closed stepladder.  Make sure that both sides of the stepladder are locked into place.  Ask someone to hold it for you, if possible.  Clearly mark and make sure that you can see:  Any grab bars or handrails.  First and last steps.  Where the edge of each step is.  Use tools that help you move around (mobility aids) if they are needed. These include:  Canes.  Walkers.  Scooters.  Crutches.  Turn on the lights when you go into a dark area. Replace any light bulbs as soon as they burn out.  Set up your furniture so you have a clear path. Avoid moving your furniture around.  If any of your floors are uneven, fix them.  If there are any pets around you, be aware of where they are.  Review your medicines with your doctor. Some medicines can make you feel dizzy. This can increase your chance of falling. Ask your doctor what other things that you can do to help prevent falls. This information is not intended to replace advice given to you by your health care provider. Make sure you discuss any questions you have with your health care provider. Document Released: 04/06/2009 Document Revised: 11/16/2015 Document Reviewed: 07/15/2014 Elsevier Interactive Patient Education  2017 [employer].

## 2017-12-18 NOTE — Progress Notes (Signed)
Subjective:   Bill Aguilar is a 72 y.o. male who presents for Medicare Annual/Subsequent preventive examination.  Review of Systems:  Cardiac Risk Factors include: male gender;hypertension;dyslipidemia;advanced age (>37men, >57 women);obesity (BMI >30kg/m2)     Objective:    Vitals: BP 118/68 (BP Location: Left Arm, Patient Position: Sitting)   Pulse 75   Temp 98.1 F (36.7 C) (Temporal)   Resp 16   Ht 5\' 9"  (1.753 m)   Wt 225 lb 9.6 oz (102.3 kg)   BMI 33.32 kg/m   Body mass index is 33.32 kg/m.  Advanced Directives 12/18/2017  Does Patient Have a Medical Advance Directive? Yes  Type of Advance Directive Living will;Healthcare Power of [place] in Chart? No - copy requested    Tobacco Social History   Tobacco Use  Smoking Status Former Smoker  . Last attempt to quit: 05/14/1969  . Years since quitting: 48.6  Smokeless Tobacco Never Used     Counseling given: Not Answered   Clinical Intake:  Pre-visit preparation completed: Yes  Pain : No/denies pain     Nutritional Status: BMI > 30  Obese Nutritional Risks: None Diabetes: No  How often do you need to have someone help you when you read instructions, pamphlets, or other written materials from your doctor or pharmacy?: 1 - Never What is the last grade level you completed in school?: 12th grade, tech school - [employer]  Interpreter Needed?: No  Information entered by :: Aymara Sassi,LPN   Past Medical History:  Diagnosis Date  . Cancer ([place]) 1991   malignant melanoma  . Chronic kidney disease   . Essential hypertension   . Heart murmur   . History of diverticulitis 06/2014  . Hyperlipidemia   . Membranous glomerulonephritis   . Myasthenia gravis ([place])   . Obesity    Past Surgical History:  Procedure Laterality Date  . EYE SURGERY     cataract surgery done at [place]   . LITHOTRIPSY    . submandibular gland removal Left    Family  History  Problem Relation Age of Onset  . Hypertension Mother   . Thyroid disease Mother   . Breast cancer Mother   . Asthma Father    Social History   Socioeconomic History  . Marital status: Married    Spouse name: Not on file  . Number of children: Not on file  . Years of education: Not on file  . Highest education level: Not on file  Occupational History  . Not on file  Social Needs  . Financial resource strain: Not hard at all  . Food insecurity:    Worry: Never true    Inability: Never true  . Transportation needs:    Medical: No    Non-medical: No  Tobacco Use  . Smoking status: Former Smoker    Last attempt to quit: 05/14/1969    Years since quitting: 48.6  . Smokeless tobacco: Never Used  Substance and Sexual Activity  . Alcohol use: No  . Drug use: No  . Sexual activity: Not on file  Lifestyle  . Physical activity:    Days per week: 0 days    Minutes per session: 0 min  . Stress: Not at all  Relationships  . Social connections:    Talks on phone: More than three times a week    Gets together: More than three times a week    Attends religious service: More  than 4 times per year    Active member of club or organization: No    Attends meetings of clubs or organizations: Never    Relationship status: Married  Other Topics Concern  . Not on file  Social History Narrative  . Not on file    Outpatient Encounter Medications as of 12/18/2017  Medication Sig  . aspirin EC 81 MG tablet Take 81 mg by mouth daily.  Marland Kitchen atorvastatin (LIPITOR) 40 MG tablet Take 1 tablet (40 mg total) by mouth daily.  . benazepril (LOTENSIN) 20 MG tablet Take 1 tablet (20 mg total) by mouth daily.  Marland Kitchen levothyroxine (SYNTHROID, LEVOTHROID) 175 MCG tablet Take 1 tablet (175 mcg total) by mouth daily before breakfast.  . Multiple Vitamins-Minerals (CENTRUM SILVER 50+MEN PO) Take 1 tablet by mouth daily.  . mycophenolate (CELLCEPT) 250 MG capsule Take 250 mg by mouth 2 (two) times daily.   . Nutritional Supplements (THERALITH XR PO) Take by mouth 2 (two) times daily.  . Omega-3 Fatty Acids (OMEGA 3 PO) Take by mouth 2 (two) times daily.  Marland Kitchen omeprazole (PRILOSEC OTC) 20 MG tablet Take 1 tablet (20 mg total) by mouth daily. (Patient taking differently: Take 20 mg by mouth daily as needed. )  . pyridostigmine (MESTINON) 60 MG tablet Take 60 mg by mouth 2 (two) times daily.   . Saw Palmetto, Serenoa repens, 450 MG CAPS Take 1,350 mg by mouth daily.    No facility-administered encounter medications on file as of 12/18/2017.     Activities of Daily Living In your present state of health, do you have any difficulty performing the following activities: 12/18/2017  Hearing? N  Vision? N  Difficulty concentrating or making decisions? N  Walking or climbing stairs? N  Dressing or bathing? N  Doing errands, shopping? N  Preparing Food and eating ? N  Using the Toilet? N  In the past six months, have you accidently leaked urine? N  Do you have problems with loss of bowel control? N  Managing your Medications? N  Managing your Finances? N  Housekeeping or managing your Housekeeping? N  Some recent data might be hidden    Patient Care Team: Guadalupe Maple, MD as PCP - General (Family Medicine) Guadalupe Maple, MD as PCP - Family Medicine (Family Medicine)   Assessment:   This is a routine wellness examination for Bill Aguilar.  Exercise Activities and Dietary recommendations Current Exercise Habits: The patient does not participate in regular exercise at present, Exercise limited by: None identified  Goals    . DIET - INCREASE WATER INTAKE     Recommend drinking at least 6-8 glasses of water a day        Fall Risk Fall Risk  12/18/2017 06/09/2017 12/05/2016 06/06/2016 05/15/2015  Falls in the past year? No No No No No   Is the patient's home free of loose throw rugs in walkways, pet beds, electrical cords, etc?   yes      Grab bars in the bathroom? yes      Handrails on the  stairs?   yes      Adequate lighting?   yes   Timed Get Up and Go Performed:Completed in 9 seconds with no use of assistive devices, steady gait. No intervention needed at this time.   Depression Screen PHQ 2/9 Scores 12/18/2017 06/09/2017 06/06/2016 05/15/2015  PHQ - 2 Score 0 0 0 0    Cognitive Function     6CIT Screen 12/18/2017  What Year? 0 points  What month? 0 points  What time? 0 points  Count back from 20 0 points  Months in reverse 0 points  Repeat phrase 0 points  Total Score 0    Immunization History  Administered Date(s) Administered  . Influenza, High Dose Seasonal PF 06/06/2016  . Pneumococcal-Unspecified 08/23/2010  . Zoster 12/17/2011    Qualifies for Shingles Vaccine? Yes,discussed shingrix vaccine   Screening Tests Health Maintenance  Topic Date Due  . TETANUS/TDAP  05/17/1963  . PNA vac Low Risk Adult (2 of 2 - PCV13) 06/09/2018 (Originally 08/23/2011)  . INFLUENZA VACCINE  01/22/2018  . COLONOSCOPY  04/11/2024   Cancer Screenings: Lung: Low Dose CT Chest recommended if Age 27-80 years, 30 pack-year currently smoking OR have quit w/in 15years. Patient does not qualify. Colorectal: completed 04/11/2014  Additional Screenings:  Hepatitis C Screening: not indicated       Plan:    I have personally reviewed and addressed the Medicare Annual Wellness questionnaire and have noted the following in the patient's chart:  A. Medical and social history B. Use of alcohol, tobacco or illicit drugs  C. Current medications and supplements D. Functional ability and status E.  Nutritional status F.  Physical activity G. Advance directives H. List of other physicians I.  Hospitalizations, surgeries, and ER visits in previous 12 months J.  [place] such as hearing and vision if needed, cognitive and depression L. Referrals and appointments   In addition, I have reviewed and discussed with patient certain preventive protocols, quality metrics,  and best practice recommendations. A written personalized care plan for preventive services as well as general preventive health recommendations were provided to patient.   Signed,  Tyler Aas, LPN Nurse Health Advisor   Nurse Notes: none

## 2017-12-18 NOTE — Addendum Note (Signed)
Addended by: Tyler Aas A on: 12/18/2017 11:01 AM   Modules accepted: Orders

## 2017-12-19 LAB — LIPID PANEL W/O CHOL/HDL RATIO
Cholesterol, Total: 196 mg/dL (ref 100–199)
HDL: 35 mg/dL — ABNORMAL LOW (ref >39)
Triglycerides: 401 mg/dL — ABNORMAL HIGH (ref 0–149)

## 2017-12-19 LAB — BASIC METABOLIC PANEL
BUN/Creatinine Ratio: 9 — ABNORMAL LOW (ref 10–24)
BUN: 24 mg/dL (ref 8–27)
CO2: 21 mmol/L (ref 20–29)
Calcium: 9.5 mg/dL (ref 8.6–10.2)
Chloride: 104 mmol/L (ref 96–106)
Creatinine, Ser: 2.64 mg/dL — ABNORMAL HIGH (ref 0.76–1.27)
GFR calc Af Amer: 27 mL/min/{1.73_m2} — ABNORMAL LOW (ref >59)
GFR calc non Af Amer: 23 mL/min/{1.73_m2} — ABNORMAL LOW (ref >59)
Glucose: 108 mg/dL — ABNORMAL HIGH (ref 65–99)
Potassium: 4.9 mmol/L (ref 3.5–5.2)
Sodium: 143 mmol/L (ref 134–144)

## 2017-12-23 ENCOUNTER — Encounter: Payer: Self-pay | Admitting: Family Medicine

## 2017-12-23 ENCOUNTER — Ambulatory Visit (INDEPENDENT_AMBULATORY_CARE_PROVIDER_SITE_OTHER): Payer: PPO | Admitting: Family Medicine

## 2017-12-23 VITALS — BP 112/68 | HR 95 | Wt 225.0 lb

## 2017-12-23 DIAGNOSIS — E039 Hypothyroidism, unspecified: Secondary | ICD-10-CM

## 2017-12-23 DIAGNOSIS — Z23 Encounter for immunization: Secondary | ICD-10-CM

## 2017-12-23 DIAGNOSIS — G7000 Myasthenia gravis without (acute) exacerbation: Secondary | ICD-10-CM | POA: Diagnosis not present

## 2017-12-23 DIAGNOSIS — E7800 Pure hypercholesterolemia, unspecified: Secondary | ICD-10-CM | POA: Diagnosis not present

## 2017-12-23 DIAGNOSIS — I10 Essential (primary) hypertension: Secondary | ICD-10-CM

## 2017-12-23 NOTE — Assessment & Plan Note (Signed)
The current medical regimen is effective;  continue present plan and medications.  

## 2017-12-23 NOTE — Assessment & Plan Note (Signed)
Stable no issues

## 2017-12-23 NOTE — Progress Notes (Signed)
BP 112/68   Pulse 95   Wt 225 lb (102.1 kg)   SpO2 98%   BMI 33.23 kg/m    Subjective:    Patient ID: Bill Aguilar, male    DOB: [birth year], 72 y.o.   MRN: [id number]  HPI: Bill Aguilar is a 72 y.o. male  Chief Complaint  Patient presents with  . Follow-up  . Hypertension  . Hyperlipidemia  Patient all in all doing well no complaints from blood pressure medication which is doing well with good blood pressure control. Getting good reports from nephrology. Cholesterol also doing well with no complaints. Reviewed labs done last week which are stable.  Relevant past medical, surgical, family and social history reviewed and updated as indicated. Interim medical history since our last visit reviewed. Allergies and medications reviewed and updated.  Review of Systems  Constitutional: Negative.   Respiratory: Negative.   Cardiovascular: Negative.     Per HPI unless specifically indicated above     Objective:    BP 112/68   Pulse 95   Wt 225 lb (102.1 kg)   SpO2 98%   BMI 33.23 kg/m   Wt Readings from Last 3 Encounters:  12/23/17 225 lb (102.1 kg)  12/18/17 225 lb 9.6 oz (102.3 kg)  06/09/17 227 lb (103 kg)    Physical Exam  Constitutional: He is oriented to person, place, and time. He appears well-developed and well-nourished.  HENT:  Head: Normocephalic and atraumatic.  Eyes: Conjunctivae and EOM are normal.  Neck: Normal range of motion.  Cardiovascular: Normal rate, regular rhythm and normal heart sounds.  Pulmonary/Chest: Effort normal and breath sounds normal.  Musculoskeletal: Normal range of motion.  Neurological: He is alert and oriented to person, place, and time.  Skin: No erythema.  Psychiatric: He has a normal mood and affect. His behavior is normal. Judgment and thought content normal.    Results for orders placed or performed in visit on 32/95/18  Basic Metabolic Panel (BMET)  Result Value Ref Range   Glucose 108 (H) 65 - 99  mg/dL   BUN 24 8 - 27 mg/dL   Creatinine, Ser 2.64 (H) 0.76 - 1.27 mg/dL   GFR calc non Af Amer 23 (L) >59 mL/min/1.73   GFR calc Af Amer 27 (L) >59 mL/min/1.73   BUN/Creatinine Ratio 9 (L) 10 - 24   Sodium 143 134 - 144 mmol/L   Potassium 4.9 3.5 - 5.2 mmol/L   Chloride 104 96 - 106 mmol/L   CO2 21 20 - 29 mmol/L   Calcium 9.5 8.6 - 10.2 mg/dL  LP+ALT+AST Piccolo, Waived (STAT)  Result Value Ref Range   ALT (SGPT) Piccolo, Waived 47 10 - 47 U/L   AST (SGOT) Piccolo, Waived 29 11 - 38 U/L   Cholesterol Piccolo, Waived 196 <200 mg/dL   HDL Chol Piccolo, Waived CANCELED    Triglycerides Piccolo,Waived 445 (H) <150 mg/dL  Lipid Panel w/o Chol/HDL Ratio  Result Value Ref Range   Cholesterol, Total 196 100 - 199 mg/dL   Triglycerides 401 (H) 0 - 149 mg/dL   HDL 35 (L) >39 mg/dL   VLDL Cholesterol Cal Comment 5 - 40 mg/dL   LDL Calculated Comment 0 - 99 mg/dL      Assessment & Plan:   Problem List Items Addressed This Visit      Cardiovascular and Mediastinum   Essential hypertension - Primary    The current medical regimen is effective;  continue present plan and  medications.         Endocrine   Hypothyroidism    The current medical regimen is effective;  continue present plan and medications.         Nervous and Auditory   Myasthenia gravis ([place])    Stable no issues        Other   Hyperlipidemia    Other Visit Diagnoses    Need for Tdap vaccination           Follow up plan: Return in about 6 months (around 06/25/2018) for Physical Exam.

## 2018-01-12 DIAGNOSIS — N049 Nephrotic syndrome with unspecified morphologic changes: Secondary | ICD-10-CM | POA: Diagnosis not present

## 2018-01-12 DIAGNOSIS — N050 Unspecified nephritic syndrome with minor glomerular abnormality: Secondary | ICD-10-CM | POA: Diagnosis not present

## 2018-01-12 DIAGNOSIS — I10 Essential (primary) hypertension: Secondary | ICD-10-CM | POA: Diagnosis not present

## 2018-01-12 DIAGNOSIS — N052 Unspecified nephritic syndrome with diffuse membranous glomerulonephritis: Secondary | ICD-10-CM | POA: Diagnosis not present

## 2018-01-15 DIAGNOSIS — N184 Chronic kidney disease, stage 4 (severe): Secondary | ICD-10-CM | POA: Diagnosis not present

## 2018-01-15 DIAGNOSIS — I10 Essential (primary) hypertension: Secondary | ICD-10-CM | POA: Diagnosis not present

## 2018-01-15 DIAGNOSIS — N052 Unspecified nephritic syndrome with diffuse membranous glomerulonephritis: Secondary | ICD-10-CM | POA: Diagnosis not present

## 2018-01-15 DIAGNOSIS — N2581 Secondary hyperparathyroidism of renal origin: Secondary | ICD-10-CM | POA: Diagnosis not present

## 2018-01-15 DIAGNOSIS — R809 Proteinuria, unspecified: Secondary | ICD-10-CM | POA: Diagnosis not present

## 2018-02-25 DIAGNOSIS — N049 Nephrotic syndrome with unspecified morphologic changes: Secondary | ICD-10-CM | POA: Diagnosis not present

## 2018-02-25 DIAGNOSIS — N050 Unspecified nephritic syndrome with minor glomerular abnormality: Secondary | ICD-10-CM | POA: Diagnosis not present

## 2018-02-25 DIAGNOSIS — N052 Unspecified nephritic syndrome with diffuse membranous glomerulonephritis: Secondary | ICD-10-CM | POA: Diagnosis not present

## 2018-02-25 DIAGNOSIS — R809 Proteinuria, unspecified: Secondary | ICD-10-CM | POA: Diagnosis not present

## 2018-04-08 DIAGNOSIS — D2262 Melanocytic nevi of left upper limb, including shoulder: Secondary | ICD-10-CM | POA: Diagnosis not present

## 2018-04-08 DIAGNOSIS — X32XXXA Exposure to sunlight, initial encounter: Secondary | ICD-10-CM | POA: Diagnosis not present

## 2018-04-08 DIAGNOSIS — L570 Actinic keratosis: Secondary | ICD-10-CM | POA: Diagnosis not present

## 2018-04-08 DIAGNOSIS — D171 Benign lipomatous neoplasm of skin and subcutaneous tissue of trunk: Secondary | ICD-10-CM | POA: Diagnosis not present

## 2018-04-08 DIAGNOSIS — D2261 Melanocytic nevi of right upper limb, including shoulder: Secondary | ICD-10-CM | POA: Diagnosis not present

## 2018-04-08 DIAGNOSIS — Z85820 Personal history of malignant melanoma of skin: Secondary | ICD-10-CM | POA: Diagnosis not present

## 2018-04-08 DIAGNOSIS — D0462 Carcinoma in situ of skin of left upper limb, including shoulder: Secondary | ICD-10-CM | POA: Diagnosis not present

## 2018-04-08 DIAGNOSIS — Z85828 Personal history of other malignant neoplasm of skin: Secondary | ICD-10-CM | POA: Diagnosis not present

## 2018-04-08 DIAGNOSIS — D225 Melanocytic nevi of trunk: Secondary | ICD-10-CM | POA: Diagnosis not present

## 2018-04-08 DIAGNOSIS — D485 Neoplasm of uncertain behavior of skin: Secondary | ICD-10-CM | POA: Diagnosis not present

## 2018-04-08 DIAGNOSIS — D2271 Melanocytic nevi of right lower limb, including hip: Secondary | ICD-10-CM | POA: Diagnosis not present

## 2018-04-08 DIAGNOSIS — Z08 Encounter for follow-up examination after completed treatment for malignant neoplasm: Secondary | ICD-10-CM | POA: Diagnosis not present

## 2018-04-08 DIAGNOSIS — D2272 Melanocytic nevi of left lower limb, including hip: Secondary | ICD-10-CM | POA: Diagnosis not present

## 2018-04-13 DIAGNOSIS — N049 Nephrotic syndrome with unspecified morphologic changes: Secondary | ICD-10-CM | POA: Diagnosis not present

## 2018-04-13 DIAGNOSIS — N052 Unspecified nephritic syndrome with diffuse membranous glomerulonephritis: Secondary | ICD-10-CM | POA: Diagnosis not present

## 2018-04-13 DIAGNOSIS — I10 Essential (primary) hypertension: Secondary | ICD-10-CM | POA: Diagnosis not present

## 2018-04-13 DIAGNOSIS — N050 Unspecified nephritic syndrome with minor glomerular abnormality: Secondary | ICD-10-CM | POA: Diagnosis not present

## 2018-04-15 DIAGNOSIS — D0462 Carcinoma in situ of skin of left upper limb, including shoulder: Secondary | ICD-10-CM | POA: Diagnosis not present

## 2018-04-16 DIAGNOSIS — N184 Chronic kidney disease, stage 4 (severe): Secondary | ICD-10-CM | POA: Diagnosis not present

## 2018-04-16 DIAGNOSIS — N050 Unspecified nephritic syndrome with minor glomerular abnormality: Secondary | ICD-10-CM | POA: Diagnosis not present

## 2018-04-16 DIAGNOSIS — I129 Hypertensive chronic kidney disease with stage 1 through stage 4 chronic kidney disease, or unspecified chronic kidney disease: Secondary | ICD-10-CM | POA: Diagnosis not present

## 2018-04-16 DIAGNOSIS — R809 Proteinuria, unspecified: Secondary | ICD-10-CM | POA: Diagnosis not present

## 2018-05-13 DIAGNOSIS — J209 Acute bronchitis, unspecified: Secondary | ICD-10-CM | POA: Diagnosis not present

## 2018-05-13 DIAGNOSIS — J069 Acute upper respiratory infection, unspecified: Secondary | ICD-10-CM | POA: Diagnosis not present

## 2018-05-15 ENCOUNTER — Ambulatory Visit: Payer: Self-pay | Admitting: Family Medicine

## 2018-05-28 DIAGNOSIS — N052 Unspecified nephritic syndrome with diffuse membranous glomerulonephritis: Secondary | ICD-10-CM | POA: Diagnosis not present

## 2018-05-28 DIAGNOSIS — N049 Nephrotic syndrome with unspecified morphologic changes: Secondary | ICD-10-CM | POA: Diagnosis not present

## 2018-05-28 DIAGNOSIS — I10 Essential (primary) hypertension: Secondary | ICD-10-CM | POA: Diagnosis not present

## 2018-05-28 DIAGNOSIS — N050 Unspecified nephritic syndrome with minor glomerular abnormality: Secondary | ICD-10-CM | POA: Diagnosis not present

## 2018-06-10 ENCOUNTER — Encounter: Payer: Self-pay | Admitting: Family Medicine

## 2018-06-10 ENCOUNTER — Ambulatory Visit (INDEPENDENT_AMBULATORY_CARE_PROVIDER_SITE_OTHER): Payer: PPO | Admitting: Family Medicine

## 2018-06-10 VITALS — BP 111/66 | HR 91 | Temp 98.3°F | Ht 69.0 in | Wt 208.2 lb

## 2018-06-10 DIAGNOSIS — Z7189 Other specified counseling: Secondary | ICD-10-CM

## 2018-06-10 DIAGNOSIS — Z23 Encounter for immunization: Secondary | ICD-10-CM | POA: Diagnosis not present

## 2018-06-10 DIAGNOSIS — N184 Chronic kidney disease, stage 4 (severe): Secondary | ICD-10-CM

## 2018-06-10 DIAGNOSIS — I10 Essential (primary) hypertension: Secondary | ICD-10-CM | POA: Diagnosis not present

## 2018-06-10 DIAGNOSIS — E7800 Pure hypercholesterolemia, unspecified: Secondary | ICD-10-CM | POA: Diagnosis not present

## 2018-06-10 DIAGNOSIS — Z0000 Encounter for general adult medical examination without abnormal findings: Secondary | ICD-10-CM | POA: Diagnosis not present

## 2018-06-10 DIAGNOSIS — N400 Enlarged prostate without lower urinary tract symptoms: Secondary | ICD-10-CM

## 2018-06-10 DIAGNOSIS — E039 Hypothyroidism, unspecified: Secondary | ICD-10-CM

## 2018-06-10 MED ORDER — OMEPRAZOLE MAGNESIUM 20 MG PO TBEC: 20.0000 mg | DELAYED_RELEASE_TABLET | Freq: Every day | ORAL | 4 refills | Status: DC

## 2018-06-10 MED ORDER — LEVOTHYROXINE SODIUM 175 MCG PO TABS: 175.0000 ug | ORAL_TABLET | Freq: Every day | ORAL | 4 refills | Status: DC

## 2018-06-10 MED ORDER — BENAZEPRIL HCL 20 MG PO TABS: 20.0000 mg | ORAL_TABLET | Freq: Every day | ORAL | 4 refills | Status: DC

## 2018-06-10 MED ORDER — ATORVASTATIN CALCIUM 40 MG PO TABS: 40.0000 mg | ORAL_TABLET | Freq: Every day | ORAL | 4 refills | Status: DC

## 2018-06-10 NOTE — Assessment & Plan Note (Signed)
The current medical regimen is effective;  continue present plan and medications.  

## 2018-06-10 NOTE — Progress Notes (Signed)
BP 111/66 (BP Location: Left Arm, Patient Position: Sitting, Cuff Size: Normal)   Pulse 91   Temp 98.3 F (36.8 C) (Oral)   Ht 5\' 9"  (1.753 m)   Wt 208 lb 3.2 oz (94.4 kg)   SpO2 95%   BMI 30.75 kg/m    Subjective:    Patient ID: Bill Aguilar, male    DOB: Jul 30, 1945, 72 y.o.   MRN: [id number]  HPI: Bill Aguilar is a 72 y.o. male  Chief Complaint  Patient presents with  . Annual Exam  Patient with some feet changes with wondering if he has neuropathy type sensations but no real burning or other discomfort just sometimes they do not feel right and some numbness.  Basically on the ball of his foot.  Kidney disease is stable and doing well.  Blood pressures doing well thyroid also is stable along with his cholesterol.  Myasthenia gravis is also stable.  Relevant past medical, surgical, family and social history reviewed and updated as indicated. Interim medical history since our last visit reviewed. Allergies and medications reviewed and updated.  Review of Systems  Constitutional: Negative.   HENT: Negative.   Eyes: Negative.   Respiratory: Negative.   Cardiovascular: Negative.   Gastrointestinal: Negative.   Endocrine: Negative.   Genitourinary: Negative.   Musculoskeletal: Negative.   Skin: Negative.   Allergic/Immunologic: Negative.   Neurological: Negative.   Hematological: Negative.   Psychiatric/Behavioral: Negative.     Per HPI unless specifically indicated above     Objective:    BP 111/66 (BP Location: Left Arm, Patient Position: Sitting, Cuff Size: Normal)   Pulse 91   Temp 98.3 F (36.8 C) (Oral)   Ht 5\' 9"  (1.753 m)   Wt 208 lb 3.2 oz (94.4 kg)   SpO2 95%   BMI 30.75 kg/m   Wt Readings from Last 3 Encounters:  06/10/18 208 lb 3.2 oz (94.4 kg)  12/23/17 225 lb (102.1 kg)  12/18/17 225 lb 9.6 oz (102.3 kg)    Physical Exam Constitutional:      Appearance: He is well-developed.  HENT:     Head: Normocephalic and atraumatic.       Right Ear: External ear normal.     Left Ear: External ear normal.  Eyes:     Conjunctiva/sclera: Conjunctivae normal.     Pupils: Pupils are equal, round, and reactive to light.  Neck:     Musculoskeletal: Normal range of motion and neck supple.  Cardiovascular:     Rate and Rhythm: Normal rate and regular rhythm.     Heart sounds: Normal heart sounds.  Pulmonary:     Effort: Pulmonary effort is normal.     Breath sounds: Normal breath sounds.  Abdominal:     General: Bowel sounds are normal.     Palpations: Abdomen is soft. There is no hepatomegaly or splenomegaly.  Genitourinary:    Penis: Normal.      Rectum: Normal.     Comments: BPH changes  Musculoskeletal: Normal range of motion.  Skin:    Findings: No erythema or rash.  Neurological:     Mental Status: He is alert and oriented to person, place, and time.     Deep Tendon Reflexes: Reflexes are normal and symmetric.  Psychiatric:        Behavior: Behavior normal.        Thought Content: Thought content normal.        Judgment: Judgment normal.     Results  for orders placed or performed in visit on 27/03/50  Basic Metabolic Panel (BMET)  Result Value Ref Range   Glucose 108 (H) 65 - 99 mg/dL   BUN 24 8 - 27 mg/dL   Creatinine, Ser 2.64 (H) 0.76 - 1.27 mg/dL   GFR calc non Af Amer 23 (L) >59 mL/min/1.73   GFR calc Af Amer 27 (L) >59 mL/min/1.73   BUN/Creatinine Ratio 9 (L) 10 - 24   Sodium 143 134 - 144 mmol/L   Potassium 4.9 3.5 - 5.2 mmol/L   Chloride 104 96 - 106 mmol/L   CO2 21 20 - 29 mmol/L   Calcium 9.5 8.6 - 10.2 mg/dL  LP+ALT+AST Piccolo, Waived (STAT)  Result Value Ref Range   ALT (SGPT) Piccolo, Waived 47 10 - 47 U/L   AST (SGOT) Piccolo, Waived 29 11 - 38 U/L   Cholesterol Piccolo, Waived 196 <200 mg/dL   HDL Chol Piccolo, Waived CANCELED    Triglycerides Piccolo,Waived 445 (H) <150 mg/dL  Lipid Panel w/o Chol/HDL Ratio  Result Value Ref Range   Cholesterol, Total 196 100 - 199 mg/dL    Triglycerides 401 (H) 0 - 149 mg/dL   HDL 35 (L) >39 mg/dL   VLDL Cholesterol Cal Comment 5 - 40 mg/dL   LDL Calculated Comment 0 - 99 mg/dL      Assessment & Plan:   Problem List Items Addressed This Visit      Cardiovascular and Mediastinum   Essential hypertension    The current medical regimen is effective;  continue present plan and medications.       Relevant Medications   benazepril (LOTENSIN) 20 MG tablet   atorvastatin (LIPITOR) 40 MG tablet     Endocrine   Hypothyroidism    The current medical regimen is effective;  continue present plan and medications.       Relevant Medications   levothyroxine (SYNTHROID, LEVOTHROID) 175 MCG tablet     Genitourinary   Stage 4 chronic kidney disease (HCC)    Followed by nephrology      BPH (benign prostatic hyperplasia)    The current medical regimen is effective;  continue present plan and medications.         Other   Hyperlipidemia    The current medical regimen is effective;  continue present plan and medications.       Relevant Medications   benazepril (LOTENSIN) 20 MG tablet   atorvastatin (LIPITOR) 40 MG tablet   Advanced care planning/counseling discussion    A voluntary discussion about advanced care planning including explanation and discussion of advanced directives was extentively discussed with the patient.  Explained about the healthcare proxy and living will was reviewed and packet with forms with expiration of how to fill them out was given.  Time spent: Encounter 16+ min individuals present: Patient       Other Visit Diagnoses    Need for influenza vaccination    -  Primary   Relevant Orders   Flu vaccine HIGH DOSE PF (Fluzone High dose) (Completed)   Need for pneumococcal vaccination       Relevant Orders   Pneumococcal conjugate vaccine 13-valent (Completed)    Patient needs labs but has blood work done every few months at nephrology and will have his blood work checked there.  Follow up  plan: Return in about 1 year (around 06/11/2019) for Physical Exam.

## 2018-06-10 NOTE — Assessment & Plan Note (Signed)
A voluntary discussion about advanced care planning including explanation and discussion of advanced directives was extentively discussed with the patient.  Explained about the healthcare proxy and living will was reviewed and packet with forms with expiration of how to fill them out was given.  Time spent: Encounter 16+ min individuals present: Patient 

## 2018-06-10 NOTE — Assessment & Plan Note (Signed)
Followed by nephrology. 

## 2018-06-10 NOTE — Patient Instructions (Signed)
Pneumococcal Conjugate Vaccine (PCV13): What You Need to Know 1. Why get vaccinated? Vaccination can protect both children and adults from pneumococcal disease. Pneumococcal disease is caused by bacteria that can spread from person to person through close contact. It can cause ear infections, and it can also lead to more serious infections of the:  Lungs (pneumonia),  Blood (bacteremia), and  Covering of the brain and spinal cord (meningitis). Pneumococcal pneumonia is most common among adults. Pneumococcal meningitis can cause deafness and brain damage, and it kills about 1 child in 10 who get it. Anyone can get pneumococcal disease, but children under 45 years of age and adults 38 years and older, people with certain medical conditions, and cigarette smokers are at the highest risk. Before there was a vaccine, the [place] saw:  more than 700 cases of meningitis,  about 13,000 blood infections,  about 5 million ear infections, and  about 200 deaths in children under 5 each year from pneumococcal disease. Since vaccine became available, severe pneumococcal disease in these children has fallen by 88%. About 18,000 older adults die of pneumococcal disease each year in the [place]. Treatment of pneumococcal infections with penicillin and other drugs is not as effective as it used to be, because some strains of the disease have become resistant to these drugs. This makes prevention of the disease, through vaccination, even more important. 2. PCV13 vaccine Pneumococcal conjugate vaccine (called PCV13) protects against 13 types of pneumococcal bacteria. PCV13 is routinely given to children at 2, 4, 6, and 64-32 months of age. It is also recommended for children and adults 6 to 35 years of age with certain health conditions, and for all adults 9 years of age and older. Your doctor can give you details. 3. Some people should not get this vaccine Anyone who has ever had a  life-threatening allergic reaction to a dose of this vaccine, to an earlier pneumococcal vaccine called PCV7, or to any vaccine containing diphtheria toxoid (for example, DTaP), should not get PCV13. Anyone with a severe allergy to any component of PCV13 should not get the vaccine. Tell your doctor if the person being vaccinated has any severe allergies. If the person scheduled for vaccination is not feeling well, your healthcare provider might decide to reschedule the shot on another day. 4. Risks of a vaccine reaction With any medicine, including vaccines, there is a chance of reactions. These are usually mild and go away on their own, but serious reactions are also possible. Problems reported following PCV13 varied by age and dose in the series. The most common problems reported among children were:  About half became drowsy after the shot, had a temporary loss of appetite, or had redness or tenderness where the shot was given.  About 1 out of 3 had swelling where the shot was given.  About 1 out of 3 had a mild fever, and about 1 in 20 had a fever over 102.70F.  Up to about 8 out of 10 became fussy or irritable. Adults have reported pain, redness, and swelling where the shot was given; also mild fever, fatigue, headache, chills, or muscle pain. Young children who get PCV13 along with inactivated flu vaccine at the same time may be at increased risk for seizures caused by fever. Ask your doctor for more information. Problems that could happen after any vaccine:  People sometimes faint after a medical procedure, including vaccination. Sitting or lying down for about 15 minutes can help prevent fainting, and injuries  caused by a fall. Tell your doctor if you feel dizzy, or have vision changes or ringing in the ears.  Some older children and adults get severe pain in the shoulder and have difficulty moving the arm where a shot was given. This happens very rarely.  Any medication can cause a  severe allergic reaction. Such reactions from a vaccine are very rare, estimated at about 1 in a million doses, and would happen within a few minutes to a few hours after the vaccination. As with any medicine, there is a very small chance of a vaccine causing a serious injury or death. The safety of vaccines is always being monitored. For more information, visit: http://www.aguilar.org/ 5. What if there is a serious reaction? What should I look for?  Look for anything that concerns you, such as signs of a severe allergic reaction, very high fever, or unusual behavior. Signs of a severe allergic reaction can include hives, swelling of the face and throat, difficulty breathing, a fast heartbeat, dizziness, and weakness-usually within a few minutes to a few hours after the vaccination. What should I do?  If you think it is a severe allergic reaction or other emergency that can't wait, call 9-1-1 or get the person to the nearest hospital. Otherwise, call your doctor. Reactions should be reported to the Vaccine Adverse Event Reporting System (VAERS). Your doctor should file this report, or you can do it yourself through the VAERS web site at www.vaers.SamedayNews.es, or by calling 531-048-2544. VAERS does not give medical advice. 6. The National Vaccine Injury Compensation Program The [employer] Vaccine Injury Compensation Program (VICP) is a federal program that was created to compensate people who may have been injured by certain vaccines. Persons who believe they may have been injured by a vaccine can learn about the program and about filing a claim by calling 253-004-7290 or visiting the [facility] website at GoldCloset.com.ee. There is a time limit to file a claim for compensation. 7. How can I learn more?  Ask your healthcare provider. He or she can give you the vaccine package insert or suggest other sources of information.  Call your local or state health department.  Contact the  Centers for Disease Control and Prevention (CDC): ? Call (850) 846-1091 (1-800-CDC-INFO) or ? Visit CDC's website at http://hunter.com/ Vaccine Information Statement PCV13 Vaccine (04/28/2014) This information is not intended to replace advice given to you by your health care provider. Make sure you discuss any questions you have with your health care provider. Document Released: 04/07/2006 Document Revised: 01/20/2018 Document Reviewed: 01/20/2018 Elsevier Interactive Patient Education  2019 [place]. Influenza (Flu) Vaccine (Inactivated or Recombinant): What You Need to Know 1. Why get vaccinated? Influenza vaccine can prevent influenza (flu). Flu is a contagious disease that spreads around the [place] every year, usually between October and May. Anyone can get the flu, but it is more dangerous for some people. Infants and young children, people 26 years of age and older, pregnant women, and people with certain health conditions or a weakened immune system are at greatest risk of flu complications. Pneumonia, bronchitis, sinus infections and ear infections are examples of flu-related complications. If you have a medical condition, such as heart disease, cancer or diabetes, flu can make it worse. Flu can cause fever and chills, sore throat, muscle aches, fatigue, cough, headache, and runny or stuffy nose. Some people may have vomiting and diarrhea, though this is more common in children than adults. Each year thousands of people in the [place]  States die from flu, and many more are hospitalized. Flu vaccine prevents millions of illnesses and flu-related visits to the doctor each year. 2. Influenza vaccine CDC recommends everyone 74 months of age and older get vaccinated every flu season. Children 6 months through 28 years of age may need 2 doses during a single flu season. Everyone else needs only 1 dose each flu season. It takes about 2 weeks for protection to develop after  vaccination. There are many flu viruses, and they are always changing. Each year a new flu vaccine is made to protect against three or four viruses that are likely to cause disease in the upcoming flu season. Even when the vaccine doesn't exactly match these viruses, it may still provide some protection. Influenza vaccine does not cause flu. Influenza vaccine may be given at the same time as other vaccines. 3. Talk with your health care provider Tell your vaccine provider if the person getting the vaccine:  Has had an allergic reaction after a previous dose of influenza vaccine, or has any severe, life-threatening allergies.  Has ever had Guillain-Barr Syndrome (also called GBS). In some cases, your health care provider may decide to postpone influenza vaccination to a future visit. People with minor illnesses, such as a cold, may be vaccinated. People who are moderately or severely ill should usually wait until they recover before getting influenza vaccine. Your health care provider can give you more information. 4. Risks of a vaccine reaction  Soreness, redness, and swelling where shot is given, fever, muscle aches, and headache can happen after influenza vaccine.  There may be a very small increased risk of Guillain-Barr Syndrome (GBS) after inactivated influenza vaccine (the flu shot). Young children who get the flu shot along with pneumococcal vaccine (PCV13), and/or DTaP vaccine at the same time might be slightly more likely to have a seizure caused by fever. Tell your health care provider if a child who is getting flu vaccine has ever had a seizure. People sometimes faint after medical procedures, including vaccination. Tell your provider if you feel dizzy or have vision changes or ringing in the ears. As with any medicine, there is a very remote chance of a vaccine causing a severe allergic reaction, other serious injury, or death. 5. What if there is a serious problem? An allergic  reaction could occur after the vaccinated person leaves the clinic. If you see signs of a severe allergic reaction (hives, swelling of the face and throat, difficulty breathing, a fast heartbeat, dizziness, or weakness), call 9-1-1 and get the person to the nearest hospital. For other signs that concern you, call your health care provider. Adverse reactions should be reported to the Vaccine Adverse Event Reporting System (VAERS). Your health care provider will usually file this report, or you can do it yourself. Visit the VAERS website at www.vaers.SamedayNews.es or call (804)283-9319.VAERS is only for reporting reactions, and VAERS staff do not give medical advice. 6. The National Vaccine Injury Compensation Program The [employer] Vaccine Injury Compensation Program (VICP) is a federal program that was created to compensate people who may have been injured by certain vaccines. Visit the VICP website at GoldCloset.com.ee or call 972 040 1098 to learn about the program and about filing a claim. There is a time limit to file a claim for compensation. 7. How can I learn more?  Ask your healthcare provider.  Call your local or state health department.  Contact the Centers for Disease Control and Prevention (CDC): ? Call (517)373-0334 (1-800-CDC-INFO) or ?  Visit CDC's https://gibson.com/ Vaccine Information Statement (Interim) Inactivated Influenza Vaccine (02/05/2018) This information is not intended to replace advice given to you by your health care provider. Make sure you discuss any questions you have with your health care provider. Document Released: 04/04/2006 Document Revised: 02/09/2018 Document Reviewed: 02/09/2018 Elsevier Interactive Patient Education  2019 [employer].

## 2018-06-24 DIAGNOSIS — C4491 Basal cell carcinoma of skin, unspecified: Secondary | ICD-10-CM

## 2018-06-24 HISTORY — DX: Basal cell carcinoma of skin, unspecified: C44.91

## 2018-07-02 DIAGNOSIS — N049 Nephrotic syndrome with unspecified morphologic changes: Secondary | ICD-10-CM | POA: Diagnosis not present

## 2018-07-02 DIAGNOSIS — E785 Hyperlipidemia, unspecified: Secondary | ICD-10-CM | POA: Diagnosis not present

## 2018-07-02 DIAGNOSIS — N184 Chronic kidney disease, stage 4 (severe): Secondary | ICD-10-CM | POA: Diagnosis not present

## 2018-07-02 DIAGNOSIS — I10 Essential (primary) hypertension: Secondary | ICD-10-CM | POA: Diagnosis not present

## 2018-07-06 DIAGNOSIS — N052 Unspecified nephritic syndrome with diffuse membranous glomerulonephritis: Secondary | ICD-10-CM | POA: Diagnosis not present

## 2018-07-06 DIAGNOSIS — N184 Chronic kidney disease, stage 4 (severe): Secondary | ICD-10-CM | POA: Diagnosis not present

## 2018-07-06 DIAGNOSIS — N2581 Secondary hyperparathyroidism of renal origin: Secondary | ICD-10-CM | POA: Diagnosis not present

## 2018-07-06 DIAGNOSIS — I129 Hypertensive chronic kidney disease with stage 1 through stage 4 chronic kidney disease, or unspecified chronic kidney disease: Secondary | ICD-10-CM | POA: Diagnosis not present

## 2018-07-06 DIAGNOSIS — R809 Proteinuria, unspecified: Secondary | ICD-10-CM | POA: Diagnosis not present

## 2018-08-21 DIAGNOSIS — I10 Essential (primary) hypertension: Secondary | ICD-10-CM | POA: Diagnosis not present

## 2018-08-21 DIAGNOSIS — N050 Unspecified nephritic syndrome with minor glomerular abnormality: Secondary | ICD-10-CM | POA: Diagnosis not present

## 2018-08-21 DIAGNOSIS — N052 Unspecified nephritic syndrome with diffuse membranous glomerulonephritis: Secondary | ICD-10-CM | POA: Diagnosis not present

## 2018-08-21 DIAGNOSIS — N049 Nephrotic syndrome with unspecified morphologic changes: Secondary | ICD-10-CM | POA: Diagnosis not present

## 2018-10-07 ENCOUNTER — Encounter: Payer: Self-pay | Admitting: Family Medicine

## 2018-10-07 ENCOUNTER — Other Ambulatory Visit: Payer: Self-pay

## 2018-10-07 ENCOUNTER — Ambulatory Visit (INDEPENDENT_AMBULATORY_CARE_PROVIDER_SITE_OTHER): Payer: PPO | Admitting: Family Medicine

## 2018-10-07 DIAGNOSIS — N184 Chronic kidney disease, stage 4 (severe): Secondary | ICD-10-CM | POA: Diagnosis not present

## 2018-10-07 DIAGNOSIS — I129 Hypertensive chronic kidney disease with stage 1 through stage 4 chronic kidney disease, or unspecified chronic kidney disease: Secondary | ICD-10-CM | POA: Diagnosis not present

## 2018-10-07 DIAGNOSIS — I10 Essential (primary) hypertension: Secondary | ICD-10-CM

## 2018-10-07 NOTE — Progress Notes (Signed)
There were no vitals taken for this visit.   Subjective:    Patient ID: Bill Aguilar, male    DOB: 12/20/1945, 73 y.o.   MRN: [id number]  HPI: Bill Aguilar is a 73 y.o. male  UTI SX Discussed with patient being treated for UTI with Proscar and cephalexin wondering if mixes with kidney failure.  Patient noticed first antibiotic was not approved for use with myasthenia gravis and changed to cephalexin.  Patient started taking the cephalexin and feels better. Patient taking appropriate COVID-19 precautions. Myasthenia gravis stable. Renal failure followed by nephrology and stable.  Telemedicine using audio/video telecommunications for a synchronous communication visit. Today's visit due to COVID-19 isolation precautions I connected with and verified that I am speaking with the correct person using two identifiers.   I discussed the limitations, risks, security and privacy concerns of performing an evaluation and management service by telecommunication and the availability of in person appointments. I also discussed with the patient that there may be a patient responsible charge related to this service. The patient expressed understanding and agreed to proceed. The patient's location is store. I am at home.  Relevant past medical, surgical, family and social history reviewed and updated as indicated. Interim medical history since our last visit reviewed. Allergies and medications reviewed and updated.  Review of Systems  Constitutional: Negative.   Respiratory: Negative.   Cardiovascular: Negative.     Per HPI unless specifically indicated above     Objective:    There were no vitals taken for this visit.  Wt Readings from Last 3 Encounters:  06/10/18 208 lb 3.2 oz (94.4 kg)  12/23/17 225 lb (102.1 kg)  12/18/17 225 lb 9.6 oz (102.3 kg)    Physical Exam  Results for orders placed or performed in visit on 03/50/09  Basic Metabolic Panel (BMET)  Result  Value Ref Range   Glucose 108 (H) 65 - 99 mg/dL   BUN 24 8 - 27 mg/dL   Creatinine, Ser 2.64 (H) 0.76 - 1.27 mg/dL   GFR calc non Af Amer 23 (L) >59 mL/min/1.73   GFR calc Af Amer 27 (L) >59 mL/min/1.73   BUN/Creatinine Ratio 9 (L) 10 - 24   Sodium 143 134 - 144 mmol/L   Potassium 4.9 3.5 - 5.2 mmol/L   Chloride 104 96 - 106 mmol/L   CO2 21 20 - 29 mmol/L   Calcium 9.5 8.6 - 10.2 mg/dL  LP+ALT+AST Piccolo, Waived (STAT)  Result Value Ref Range   ALT (SGPT) Piccolo, Waived 47 10 - 47 U/L   AST (SGOT) Piccolo, Waived 29 11 - 38 U/L   Cholesterol Piccolo, Waived 196 <200 mg/dL   HDL Chol Piccolo, Waived CANCELED    Triglycerides Piccolo,Waived 445 (H) <150 mg/dL  Lipid Panel w/o Chol/HDL Ratio  Result Value Ref Range   Cholesterol, Total 196 100 - 199 mg/dL   Triglycerides 401 (H) 0 - 149 mg/dL   HDL 35 (L) >39 mg/dL   VLDL Cholesterol Cal Comment 5 - 40 mg/dL   LDL Calculated Comment 0 - 99 mg/dL      Assessment & Plan:   Problem List Items Addressed This Visit      Cardiovascular and Mediastinum   Essential hypertension    The current medical regimen is effective;  continue present plan and medications.         Genitourinary   Stage 4 chronic kidney disease ([place])    Reviewed and calculated creatinine clearance for  Keflex dosing and using numbers in chart.31 mL/min which is stable for cephalexin usual dosage       Reviewed UTI treatment and care with tamsulosin and cephalexin which patient will continue.  I discussed the assessment and treatment plan with the patient. The patient was provided an opportunity to ask questions and all were answered. The patient agreed with the plan and demonstrated an understanding of the instructions.   The patient was advised to call back or seek an in-person evaluation if the symptoms worsen or if the condition fails to improve as anticipated.   I provided 21+ minutes of time during this encounter. Follow up plan: Return if symptoms  worsen or fail to improve, for As scheduled.

## 2018-10-07 NOTE — Assessment & Plan Note (Signed)
The current medical regimen is effective;  continue present plan and medications.  

## 2018-10-07 NOTE — Assessment & Plan Note (Signed)
Reviewed and calculated creatinine clearance for Keflex dosing and using numbers in chart.31 mL/min which is stable for cephalexin usual dosage

## 2018-10-12 ENCOUNTER — Telehealth: Payer: Self-pay | Admitting: Family Medicine

## 2018-10-12 NOTE — Telephone Encounter (Signed)
Doing well now symptoms have resolved

## 2018-10-12 NOTE — Telephone Encounter (Signed)
Pt called back stating concern was that he had a dark stool this morning. Pt stated he was reading and thinks it may be from eating blueberries and blueberry cobbler this weekend. Pt not sure there is a concern after reading this.

## 2018-10-12 NOTE — Telephone Encounter (Signed)
Copied from [facility] [phone]. Topic: Quick Communication - See Telephone Encounter >> Oct 12, 2018  8:10 AM Burchel, Abbi R wrote: CRM for notification. See Telephone encounter for: 10/12/18.  Pt would like to speak with Dr Jeananne Rama about a side effect of medication Cephalexin 500mg .   [phone]

## 2018-10-15 DIAGNOSIS — N049 Nephrotic syndrome with unspecified morphologic changes: Secondary | ICD-10-CM | POA: Diagnosis not present

## 2018-10-15 DIAGNOSIS — N052 Unspecified nephritic syndrome with diffuse membranous glomerulonephritis: Secondary | ICD-10-CM | POA: Diagnosis not present

## 2018-10-15 DIAGNOSIS — N050 Unspecified nephritic syndrome with minor glomerular abnormality: Secondary | ICD-10-CM | POA: Diagnosis not present

## 2018-10-15 DIAGNOSIS — N184 Chronic kidney disease, stage 4 (severe): Secondary | ICD-10-CM | POA: Diagnosis not present

## 2018-10-21 DIAGNOSIS — N184 Chronic kidney disease, stage 4 (severe): Secondary | ICD-10-CM | POA: Diagnosis not present

## 2018-10-21 DIAGNOSIS — I10 Essential (primary) hypertension: Secondary | ICD-10-CM | POA: Diagnosis not present

## 2018-10-21 DIAGNOSIS — N2581 Secondary hyperparathyroidism of renal origin: Secondary | ICD-10-CM | POA: Diagnosis not present

## 2018-10-21 DIAGNOSIS — N052 Unspecified nephritic syndrome with diffuse membranous glomerulonephritis: Secondary | ICD-10-CM | POA: Diagnosis not present

## 2018-10-21 DIAGNOSIS — R809 Proteinuria, unspecified: Secondary | ICD-10-CM | POA: Diagnosis not present

## 2018-12-07 DIAGNOSIS — N052 Unspecified nephritic syndrome with diffuse membranous glomerulonephritis: Secondary | ICD-10-CM | POA: Diagnosis not present

## 2018-12-07 DIAGNOSIS — N050 Unspecified nephritic syndrome with minor glomerular abnormality: Secondary | ICD-10-CM | POA: Diagnosis not present

## 2018-12-07 DIAGNOSIS — I10 Essential (primary) hypertension: Secondary | ICD-10-CM | POA: Diagnosis not present

## 2018-12-07 DIAGNOSIS — N049 Nephrotic syndrome with unspecified morphologic changes: Secondary | ICD-10-CM | POA: Diagnosis not present

## 2018-12-21 ENCOUNTER — Ambulatory Visit (INDEPENDENT_AMBULATORY_CARE_PROVIDER_SITE_OTHER): Payer: PPO

## 2018-12-21 VITALS — BP 177/64 | Ht 69.0 in | Wt 198.4 lb

## 2018-12-21 DIAGNOSIS — Z0000 Encounter for general adult medical examination without abnormal findings: Secondary | ICD-10-CM

## 2018-12-21 NOTE — Patient Instructions (Addendum)
Bill Aguilar , Thank you for taking time to come for your Medicare Wellness Visit. I appreciate your ongoing commitment to your health goals. Please review the following plan we discussed and let me know if I can assist you in the future.   Screening recommendations/referrals: Colonoscopy: completed 04/11/2014 Recommended yearly ophthalmology/optometry visit for glaucoma screening and checkup Recommended yearly dental visit for hygiene and checkup  Vaccinations: Influenza vaccine: up to date Pneumococcal vaccine: up to date  Tdap vaccine: due, check with your insurance company for coverage Shingles vaccine: shingrix eligible,  check with your insurance company for coverage  Advanced directives: Please bring a copy of your health care power of attorney and living will to the office at your convenience.  Conditions/risks identified: continue to drink at least 6-8 glasses.   Next appointment: Follow up in one year for your annual wellness exam.   Preventive Care 65 Years and Older, Male Preventive care refers to lifestyle choices and visits with your health care provider that can promote health and wellness. What does preventive care include?  A yearly physical exam. This is also called an annual well check.  Dental exams once or twice a year.  Routine eye exams. Ask your health care provider how often you should have your eyes checked.  Personal lifestyle choices, including:  Daily care of your teeth and gums.  Regular physical activity.  Eating a healthy diet.  Avoiding tobacco and drug use.  Limiting alcohol use.  Practicing safe sex.  Taking low doses of aspirin every day.  Taking vitamin and mineral supplements as recommended by your health care provider. What happens during an annual well check? The services and screenings done by your health care provider during your annual well check will depend on your age, overall health, lifestyle risk factors, and family history  of disease. Counseling  Your health care provider may ask you questions about your:  Alcohol use.  Tobacco use.  Drug use.  Emotional well-being.  Home and relationship well-being.  Sexual activity.  Eating habits.  History of falls.  Memory and ability to understand (cognition).  Work and work Statistician. Screening  You may have the following tests or measurements:  Height, weight, and BMI.  Blood pressure.  Lipid and cholesterol levels. These may be checked every 5 years, or more frequently if you are over 62 years old.  Skin check.  Lung cancer screening. You may have this screening every year starting at age 19 if you have a 30-pack-year history of smoking and currently smoke or have quit within the past 15 years.  Fecal occult blood test (FOBT) of the stool. You may have this test every year starting at age 60.  Flexible sigmoidoscopy or colonoscopy. You may have a sigmoidoscopy every 5 years or a colonoscopy every 10 years starting at age 85.  Prostate cancer screening. Recommendations will vary depending on your family history and other risks.  Hepatitis C blood test.  Hepatitis B blood test.  Sexually transmitted disease (STD) testing.  Diabetes screening. This is done by checking your blood sugar (glucose) after you have not eaten for a while (fasting). You may have this done every 1-3 years.  Abdominal aortic aneurysm (AAA) screening. You may need this if you are a current or former smoker.  Osteoporosis. You may be screened starting at age 35 if you are at high risk. Talk with your health care provider about your test results, treatment options, and if necessary, the need for more tests.  Vaccines  Your health care provider may recommend certain vaccines, such as:  Influenza vaccine. This is recommended every year.  Tetanus, diphtheria, and acellular pertussis (Tdap, Td) vaccine. You may need a Td booster every 10 years.  Zoster vaccine. You may  need this after age 55.  Pneumococcal 13-valent conjugate (PCV13) vaccine. One dose is recommended after age 64.  Pneumococcal polysaccharide (PPSV23) vaccine. One dose is recommended after age 43. Talk to your health care provider about which screenings and vaccines you need and how often you need them. This information is not intended to replace advice given to you by your health care provider. Make sure you discuss any questions you have with your health care provider. Document Released: 07/07/2015 Document Revised: 02/28/2016 Document Reviewed: 04/11/2015 Elsevier Interactive Patient Education  2017 [employer] Prevention in the Home Falls can cause injuries. They can happen to people of all ages. There are many things you can do to make your home safe and to help prevent falls. What can I do on the outside of my home?  Regularly fix the edges of walkways and driveways and fix any cracks.  Remove anything that might make you trip as you walk through a door, such as a raised step or threshold.  Trim any bushes or trees on the path to your home.  Use bright outdoor lighting.  Clear any walking paths of anything that might make someone trip, such as rocks or tools.  Regularly check to see if handrails are loose or broken. Make sure that both sides of any steps have handrails.  Any raised decks and porches should have guardrails on the edges.  Have any leaves, snow, or ice cleared regularly.  Use sand or salt on walking paths during winter.  Clean up any spills in your garage right away. This includes oil or grease spills. What can I do in the bathroom?  Use night lights.  Install grab bars by the toilet and in the tub and shower. Do not use towel bars as grab bars.  Use non-skid mats or decals in the tub or shower.  If you need to sit down in the shower, use a plastic, non-slip stool.  Keep the floor dry. Clean up any water that spills on the floor as soon as it  happens.  Remove soap buildup in the tub or shower regularly.  Attach bath mats securely with double-sided non-slip rug tape.  Do not have throw rugs and other things on the floor that can make you trip. What can I do in the bedroom?  Use night lights.  Make sure that you have a light by your bed that is easy to reach.  Do not use any sheets or blankets that are too big for your bed. They should not hang down onto the floor.  Have a firm chair that has side arms. You can use this for support while you get dressed.  Do not have throw rugs and other things on the floor that can make you trip. What can I do in the kitchen?  Clean up any spills right away.  Avoid walking on wet floors.  Keep items that you use a lot in easy-to-reach places.  If you need to reach something above you, use a strong step stool that has a grab bar.  Keep electrical cords out of the way.  Do not use floor polish or wax that makes floors slippery. If you must use wax, use non-skid floor wax.  Do not have throw rugs and other things on the floor that can make you trip. What can I do with my stairs?  Do not leave any items on the stairs.  Make sure that there are handrails on both sides of the stairs and use them. Fix handrails that are broken or loose. Make sure that handrails are as long as the stairways.  Check any carpeting to make sure that it is firmly attached to the stairs. Fix any carpet that is loose or worn.  Avoid having throw rugs at the top or bottom of the stairs. If you do have throw rugs, attach them to the floor with carpet tape.  Make sure that you have a light switch at the top of the stairs and the bottom of the stairs. If you do not have them, ask someone to add them for you. What else can I do to help prevent falls?  Wear shoes that:  Do not have high heels.  Have rubber bottoms.  Are comfortable and fit you well.  Are closed at the toe. Do not wear sandals.  If you  use a stepladder:  Make sure that it is fully opened. Do not climb a closed stepladder.  Make sure that both sides of the stepladder are locked into place.  Ask someone to hold it for you, if possible.  Clearly mark and make sure that you can see:  Any grab bars or handrails.  First and last steps.  Where the edge of each step is.  Use tools that help you move around (mobility aids) if they are needed. These include:  Canes.  Walkers.  Scooters.  Crutches.  Turn on the lights when you go into a dark area. Replace any light bulbs as soon as they burn out.  Set up your furniture so you have a clear path. Avoid moving your furniture around.  If any of your floors are uneven, fix them.  If there are any pets around you, be aware of where they are.  Review your medicines with your doctor. Some medicines can make you feel dizzy. This can increase your chance of falling. Ask your doctor what other things that you can do to help prevent falls. This information is not intended to replace advice given to you by your health care provider. Make sure you discuss any questions you have with your health care provider. Document Released: 04/06/2009 Document Revised: 11/16/2015 Document Reviewed: 07/15/2014 Elsevier Interactive Patient Education  2017 [employer].

## 2018-12-21 NOTE — Progress Notes (Signed)
Subjective:   Bill Aguilar is a 73 y.o. male who presents for Medicare Annual/Subsequent preventive examination.  This visit is being conducted via phone call  - after an attmept to do on video chat - due to the COVID-19 pandemic. This patient has given me verbal consent via phone to conduct this visit, patient states they are participating from their home address. Some vital signs may be absent or patient reported.   Patient identification: identified by name, DOB, and current address.    Review of Systems:   Cardiac Risk Factors include: advanced age (>16men, >11 women);male gender;hypertension     Objective:    Vitals: BP (!) 177/64 Comment: patient reported  Ht 5\' 9"  (1.753 m) Comment: patient reported  Wt 198 lb 6.4 oz (90 kg) Comment: patient reported  BMI 29.30 kg/m   Body mass index is 29.3 kg/m.  Advanced Directives 12/21/2018 12/18/2017  Does Patient Have a Medical Advance Directive? Yes Yes  Type of Advance Directive Living will;Healthcare Power of Attorney Living will;Healthcare Power of [place] in Chart? No - copy requested No - copy requested    Tobacco Social History   Tobacco Use  Smoking Status Former Smoker  . Quit date: 05/14/1969  . Years since quitting: 49.6  Smokeless Tobacco Never Used     Counseling given: Not Answered   Clinical Intake:  Pre-visit preparation completed: Yes  Pain : No/denies pain     Nutritional Risks: None Diabetes: No  How often do you need to have someone help you when you read instructions, pamphlets, or other written materials from your doctor or pharmacy?: 1 - Never What is the last grade level you completed in school?: HIGH SCHOOL  Interpreter Needed?: No  Information entered by :: Tiffany Hill,LPN  Past Medical History:  Diagnosis Date  . Cancer ([place]) 1991   malignant melanoma  . Chronic kidney disease   . Essential hypertension   . Heart murmur   .  History of diverticulitis 06/2014  . Hyperlipidemia   . Membranous glomerulonephritis   . Myasthenia gravis ([place])   . Obesity    Past Surgical History:  Procedure Laterality Date  . EYE SURGERY     cataract surgery done at [place]   . LITHOTRIPSY    . submandibular gland removal Left    Family History  Problem Relation Age of Onset  . Hypertension Mother   . Thyroid disease Mother   . Breast cancer Mother   . Asthma Father    Social History   Socioeconomic History  . Marital status: Married    Spouse name: Not on file  . Number of children: Not on file  . Years of education: Not on file  . Highest education level: High school graduate  Occupational History  . Not on file  Social Needs  . Financial resource strain: Not hard at all  . Food insecurity    Worry: Never true    Inability: Never true  . Transportation needs    Medical: No    Non-medical: No  Tobacco Use  . Smoking status: Former Smoker    Quit date: 05/14/1969    Years since quitting: 49.6  . Smokeless tobacco: Never Used  Substance and Sexual Activity  . Alcohol use: No  . Drug use: No  . Sexual activity: Not on file  Lifestyle  . Physical activity    Days per week: 3 days    Minutes per  session: 30 min  . Stress: Not at all  Relationships  . Social connections    Talks on phone: More than three times a week    Gets together: More than three times a week    Attends religious service: More than 4 times per year    Active member of club or organization: No    Attends meetings of clubs or organizations: Never    Relationship status: Married  Other Topics Concern  . Not on file  Social History Narrative  . Not on file    Outpatient Encounter Medications as of 12/21/2018  Medication Sig  . aspirin EC 81 MG tablet Take 81 mg by mouth daily.  Marland Kitchen atorvastatin (LIPITOR) 40 MG tablet Take 1 tablet (40 mg total) by mouth daily.  . benazepril (LOTENSIN) 20 MG tablet Take 1 tablet (20 mg total) by mouth  daily.  . Cholecalciferol (VITAMIN D3) 25 MCG (1000 UT) CAPS Take 1 capsule by mouth daily.  Marland Kitchen levothyroxine (SYNTHROID, LEVOTHROID) 175 MCG tablet Take 1 tablet (175 mcg total) by mouth daily before breakfast.  . mycophenolate (CELLCEPT) 250 MG capsule Take 250 mg by mouth 2 (two) times daily.  . Nutritional Supplements (THERALITH XR PO) Take by mouth 2 (two) times daily. Takes 2 tablets in AM and 2 tablets in PM  . Omega-3 Fatty Acids (OMEGA 3 PO) Take by mouth 2 (two) times daily.  Marland Kitchen omeprazole (PRILOSEC OTC) 20 MG tablet Take 1 tablet (20 mg total) by mouth daily.  Marland Kitchen pyridostigmine (MESTINON) 60 MG tablet Take 60 mg by mouth 2 (two) times daily.   . Saw Palmetto, Serenoa repens, 450 MG CAPS Take 1,350 mg by mouth daily.    No facility-administered encounter medications on file as of 12/21/2018.     Activities of Daily Living In your present state of health, do you have any difficulty performing the following activities: 12/21/2018 06/10/2018  Hearing? Y N  Vision? N N  Difficulty concentrating or making decisions? N N  Walking or climbing stairs? Y N  Comment SOB with more than 1 flight -  Dressing or bathing? N N  Doing errands, shopping? N N  Preparing Food and eating ? N -  Using the Toilet? N -  In the past six months, have you accidently leaked urine? N -  Do you have problems with loss of bowel control? N -  Managing your Medications? N -  Managing your Finances? N -  Housekeeping or managing your Housekeeping? N -  Some recent data might be hidden    Patient Care Team: Guadalupe Maple, MD as PCP - General (Family Medicine) Guadalupe Maple, MD as PCP - Family Medicine (Family Medicine)   Assessment:   This is a routine wellness examination for Bill Aguilar.  Exercise Activities and Dietary recommendations Current Exercise Habits: The patient does not participate in regular exercise at present;Home exercise routine, Type of exercise: walking, Time (Minutes): 30, Frequency  (Times/Week): 3, Weekly Exercise (Minutes/Week): 90, Exercise limited by: None identified  Goals    . DIET - INCREASE WATER INTAKE     Recommend drinking at least 6-8 glasses of water a day        Fall Risk: Fall Risk  12/21/2018 06/10/2018 12/18/2017 06/09/2017 12/05/2016  Falls in the past year? 0 0 No No No  Follow up - Falls evaluation completed - - -    FALL RISK PREVENTION PERTAINING TO THE HOME:  Any stairs in or around the home?  Yes  4 steps going into the house  If so, are there any without handrails? No   Home free of loose throw rugs in walkways, pet beds, electrical cords, etc? Yes  Adequate lighting in your home to reduce risk of falls? Yes   ASSISTIVE DEVICES UTILIZED TO PREVENT FALLS:  Life alert? No  Use of a cane, walker or w/c? No  Grab bars in the bathroom? Yes  Shower chair or bench in shower? Yes  Elevated toilet seat or a handicapped toilet? Yes  handicap toliet  TIMED UP AND GO:  Unable to perform   Depression Screen PHQ 2/9 Scores 12/21/2018 12/18/2017 06/09/2017 06/06/2016  PHQ - 2 Score 0 0 0 0    Cognitive Function     6CIT Screen 12/21/2018 12/18/2017  What Year? 0 points 0 points  What month? 0 points 0 points  What time? 0 points 0 points  Count back from 20 0 points 0 points  Months in reverse 0 points 0 points  Repeat phrase 0 points 0 points  Total Score 0 0    Immunization History  Administered Date(s) Administered  . Influenza, High Dose Seasonal PF 06/06/2016, 06/10/2018  . Pneumococcal Conjugate-13 06/10/2018  . Pneumococcal-Unspecified 08/23/2010  . Zoster 12/17/2011    Qualifies for Shingles Vaccine? Yes  Zostavax completed 12/17/2011. Due for Shingrix. Education has been provided regarding the importance of this vaccine. Pt has been advised to call insurance company to determine out of pocket expense. Advised may also receive vaccine at local pharmacy or Health Dept. Verbalized acceptance and understanding.  Tdap:  Discussed need for TD/TDAP vaccine, patient verbalized understanding that this is not covered as a preventative with there insurance and to call the office if he develops any new skin injuries, ie: cuts, scrapes, bug bites, or open wounds.  Flu Vaccine: up to date  Pneumococcal Vaccine: up to date   Screening Tests Health Maintenance  Topic Date Due  . TETANUS/TDAP  06/11/2019 (Originally 05/17/1965)  . INFLUENZA VACCINE  01/23/2019  . COLONOSCOPY  04/11/2024  . PNA vac Low Risk Adult  Completed   Cancer Screenings:  Colorectal Screening: Completed 04/11/2014. Repeat every 10 years  Lung Cancer Screening: (Low Dose CT Chest recommended if Age 40-80 years, 30 pack-year currently smoking OR have quit w/in 15years.) does not qualify.    Additional Screening:  Hepatitis C Screening: does not qualify  Vision Screening: Recommended annual ophthalmology exams for early detection of glaucoma and other disorders of the eye. Is the patient up to date with their annual eye exam?  Yes  Who is the provider or what is the name of the office in which the pt attends annual eye exams?goes to [place] Screening: Recommended annual dental exams for proper oral hygiene  Community Resource Referral:  CRR required this visit?  No        Plan:  I have personally reviewed and addressed the Medicare Annual Wellness questionnaire and have noted the following in the patient's chart:  A. Medical and social history B. Use of alcohol, tobacco or illicit drugs  C. Current medications and supplements D. Functional ability and status E.  Nutritional status F.  Physical activity G. Advance directives H. List of other physicians I.  Hospitalizations, surgeries, and ER visits in previous 12 months J.  [place] such as hearing and vision if needed, cognitive and depression L. Referrals and appointments   In addition, I have reviewed and discussed with patient  certain preventive  protocols, quality metrics, and best practice recommendations. A written personalized care plan for preventive services as well as general preventive health recommendations were provided to patient.   Signed,   Bevelyn Ngo, LPN  7/78/2423 Nurse Health Advisor   Nurse Notes: Patient complained of dizziness when standing a couple times in the last week, states he checked his BP when it happened and states it was normal. Advised patient to make sure he is standing up slowly from a sitting position, drinking plenty of water and to call if the dizziness doesn't improve or worsens to be seen by a provider. Patient verbalized understanding.

## 2019-01-12 DIAGNOSIS — N052 Unspecified nephritic syndrome with diffuse membranous glomerulonephritis: Secondary | ICD-10-CM | POA: Diagnosis not present

## 2019-01-12 DIAGNOSIS — N050 Unspecified nephritic syndrome with minor glomerular abnormality: Secondary | ICD-10-CM | POA: Diagnosis not present

## 2019-01-12 DIAGNOSIS — I10 Essential (primary) hypertension: Secondary | ICD-10-CM | POA: Diagnosis not present

## 2019-01-12 DIAGNOSIS — N049 Nephrotic syndrome with unspecified morphologic changes: Secondary | ICD-10-CM | POA: Diagnosis not present

## 2019-01-15 DIAGNOSIS — N2581 Secondary hyperparathyroidism of renal origin: Secondary | ICD-10-CM | POA: Diagnosis not present

## 2019-01-15 DIAGNOSIS — R809 Proteinuria, unspecified: Secondary | ICD-10-CM | POA: Diagnosis not present

## 2019-01-15 DIAGNOSIS — N052 Unspecified nephritic syndrome with diffuse membranous glomerulonephritis: Secondary | ICD-10-CM | POA: Diagnosis not present

## 2019-01-15 DIAGNOSIS — N184 Chronic kidney disease, stage 4 (severe): Secondary | ICD-10-CM | POA: Diagnosis not present

## 2019-01-15 DIAGNOSIS — I129 Hypertensive chronic kidney disease with stage 1 through stage 4 chronic kidney disease, or unspecified chronic kidney disease: Secondary | ICD-10-CM | POA: Diagnosis not present

## 2019-01-22 DIAGNOSIS — D2262 Melanocytic nevi of left upper limb, including shoulder: Secondary | ICD-10-CM | POA: Diagnosis not present

## 2019-01-22 DIAGNOSIS — Z08 Encounter for follow-up examination after completed treatment for malignant neoplasm: Secondary | ICD-10-CM | POA: Diagnosis not present

## 2019-01-22 DIAGNOSIS — L570 Actinic keratosis: Secondary | ICD-10-CM | POA: Diagnosis not present

## 2019-01-22 DIAGNOSIS — L821 Other seborrheic keratosis: Secondary | ICD-10-CM | POA: Diagnosis not present

## 2019-01-22 DIAGNOSIS — X32XXXA Exposure to sunlight, initial encounter: Secondary | ICD-10-CM | POA: Diagnosis not present

## 2019-01-22 DIAGNOSIS — Z85820 Personal history of malignant melanoma of skin: Secondary | ICD-10-CM | POA: Diagnosis not present

## 2019-01-22 DIAGNOSIS — D2272 Melanocytic nevi of left lower limb, including hip: Secondary | ICD-10-CM | POA: Diagnosis not present

## 2019-01-22 DIAGNOSIS — Z85828 Personal history of other malignant neoplasm of skin: Secondary | ICD-10-CM | POA: Diagnosis not present

## 2019-01-22 DIAGNOSIS — D225 Melanocytic nevi of trunk: Secondary | ICD-10-CM | POA: Diagnosis not present

## 2019-01-22 DIAGNOSIS — D2271 Melanocytic nevi of right lower limb, including hip: Secondary | ICD-10-CM | POA: Diagnosis not present

## 2019-01-22 DIAGNOSIS — D2261 Melanocytic nevi of right upper limb, including shoulder: Secondary | ICD-10-CM | POA: Diagnosis not present

## 2019-02-26 ENCOUNTER — Other Ambulatory Visit: Payer: Self-pay

## 2019-02-26 ENCOUNTER — Encounter: Payer: Self-pay | Admitting: Emergency Medicine

## 2019-02-26 DIAGNOSIS — Z85820 Personal history of malignant melanoma of skin: Secondary | ICD-10-CM | POA: Insufficient documentation

## 2019-02-26 DIAGNOSIS — E039 Hypothyroidism, unspecified: Secondary | ICD-10-CM | POA: Insufficient documentation

## 2019-02-26 DIAGNOSIS — R319 Hematuria, unspecified: Secondary | ICD-10-CM | POA: Diagnosis present

## 2019-02-26 DIAGNOSIS — N3091 Cystitis, unspecified with hematuria: Secondary | ICD-10-CM | POA: Diagnosis not present

## 2019-02-26 DIAGNOSIS — Z79899 Other long term (current) drug therapy: Secondary | ICD-10-CM | POA: Diagnosis not present

## 2019-02-26 DIAGNOSIS — Z7982 Long term (current) use of aspirin: Secondary | ICD-10-CM | POA: Insufficient documentation

## 2019-02-26 DIAGNOSIS — I129 Hypertensive chronic kidney disease with stage 1 through stage 4 chronic kidney disease, or unspecified chronic kidney disease: Secondary | ICD-10-CM | POA: Insufficient documentation

## 2019-02-26 DIAGNOSIS — R3914 Feeling of incomplete bladder emptying: Secondary | ICD-10-CM | POA: Insufficient documentation

## 2019-02-26 DIAGNOSIS — N200 Calculus of kidney: Secondary | ICD-10-CM | POA: Diagnosis not present

## 2019-02-26 DIAGNOSIS — N184 Chronic kidney disease, stage 4 (severe): Secondary | ICD-10-CM | POA: Diagnosis not present

## 2019-02-26 DIAGNOSIS — Z87891 Personal history of nicotine dependence: Secondary | ICD-10-CM | POA: Diagnosis not present

## 2019-02-26 LAB — CBC WITH DIFFERENTIAL/PLATELET
Abs Immature Granulocytes: 0.09 10*3/uL — ABNORMAL HIGH (ref 0.00–0.07)
Basophils Absolute: 0 10*3/uL (ref 0.0–0.1)
Basophils Relative: 0 %
Eosinophils Absolute: 0.2 10*3/uL (ref 0.0–0.5)
Eosinophils Relative: 2 %
HCT: 45 % (ref 39.0–52.0)
Hemoglobin: 13.9 g/dL (ref 13.0–17.0)
Immature Granulocytes: 1 %
Lymphocytes Relative: 16 %
Lymphs Abs: 1.6 10*3/uL (ref 0.7–4.0)
MCH: 26.9 pg (ref 26.0–34.0)
MCHC: 30.9 g/dL (ref 30.0–36.0)
MCV: 87.2 fL (ref 80.0–100.0)
Monocytes Absolute: 1 10*3/uL (ref 0.1–1.0)
Monocytes Relative: 10 %
Neutro Abs: 7.2 10*3/uL (ref 1.7–7.7)
Neutrophils Relative %: 71 %
Platelets: 228 10*3/uL (ref 150–400)
RBC: 5.16 MIL/uL (ref 4.22–5.81)
RDW: 13.3 % (ref 11.5–15.5)
WBC: 10.1 10*3/uL (ref 4.0–10.5)
nRBC: 0 % (ref 0.0–0.2)

## 2019-02-26 LAB — BASIC METABOLIC PANEL
Anion gap: 9 (ref 5–15)
BUN: 34 mg/dL — ABNORMAL HIGH (ref 8–23)
CO2: 23 mmol/L (ref 22–32)
Calcium: 8.9 mg/dL (ref 8.9–10.3)
Chloride: 107 mmol/L (ref 98–111)
Creatinine, Ser: 2.86 mg/dL — ABNORMAL HIGH (ref 0.61–1.24)
GFR calc Af Amer: 24 mL/min — ABNORMAL LOW (ref >60)
GFR calc non Af Amer: 21 mL/min — ABNORMAL LOW (ref >60)
Glucose, Bld: 148 mg/dL — ABNORMAL HIGH (ref 70–99)
Potassium: 4.3 mmol/L (ref 3.5–5.1)
Sodium: 139 mmol/L (ref 135–145)

## 2019-02-26 NOTE — ED Triage Notes (Signed)
Pt arrives POV and ambulatory to triage with c/o frequent urination with a slight pink tinge which started at 1730. Pt is in NAD.

## 2019-02-27 ENCOUNTER — Emergency Department: Payer: PPO

## 2019-02-27 ENCOUNTER — Emergency Department
Admission: EM | Admit: 2019-02-27 | Discharge: 2019-02-27 | Disposition: A | Discharge status: Home / Self Care | Payer: PPO | Attending: Emergency Medicine | Admitting: Emergency Medicine

## 2019-02-27 DIAGNOSIS — N3091 Cystitis, unspecified with hematuria: Secondary | ICD-10-CM

## 2019-02-27 DIAGNOSIS — R310 Gross hematuria: Secondary | ICD-10-CM

## 2019-02-27 DIAGNOSIS — N200 Calculus of kidney: Secondary | ICD-10-CM | POA: Diagnosis not present

## 2019-02-27 LAB — URINALYSIS, COMPLETE (UACMP) WITH MICROSCOPIC
Specific Gravity, Urine: 1.017 (ref 1.005–1.030)
Squamous Epithelial / HPF: NONE SEEN (ref 0–5)

## 2019-02-27 MED ORDER — PHENAZOPYRIDINE HCL 100 MG PO TABS: 100.0000 mg | ORAL_TABLET | Freq: Three times a day (TID) | ORAL | 0 refills | Status: DC | PRN

## 2019-02-27 MED ORDER — CEPHALEXIN 500 MG PO CAPS: 500.0000 mg | ORAL_CAPSULE | Freq: Two times a day (BID) | ORAL | 0 refills | Status: AC

## 2019-02-27 MED ORDER — CEPHALEXIN 500 MG PO CAPS
500.0000 mg | ORAL_CAPSULE | Freq: Once | ORAL | Status: AC
  Administered 2019-02-27: 500 mg via ORAL
  Filled 2019-02-27: qty 1

## 2019-02-27 NOTE — ED Notes (Signed)
Report given to Rachel

## 2019-02-27 NOTE — ED Provider Notes (Signed)
[facility] Emergency Department Provider Note   First MD Initiated Contact with Patient 02/27/19 [phone]     (approximate)  I have reviewed the triage vital signs and the nursing notes.   HISTORY  Chief Complaint Urinary Tract Infection   HPI Bill Aguilar is a 73 y.o. male with below listed previous medical conditions including chronic kidney disease presents to the emergency department secondary to dysuria and hematuria which patient states began today  at 5:30 PM.  Patient denies any fever afebrile on presentation.  Patient patient states that pain is only with urination no feeling of inability to empty his bladder.        Past Medical History:  Diagnosis Date  . Cancer ([place]) 1991   malignant melanoma  . Chronic kidney disease   . Essential hypertension   . Heart murmur   . History of diverticulitis 06/2014  . Hyperlipidemia   . Membranous glomerulonephritis   . Myasthenia gravis ([place])   . Obesity     Patient Active Problem List   Diagnosis Date Noted  . Advanced care planning/counseling discussion 06/09/2017  . 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 BPH (benign prostatic hyperplasia) 06/06/2016  . Membranous glomerulonephritis 05/15/2015  . Hypothyroidism 05/15/2015  . Myasthenia gravis ([place]) 05/15/2015  . Stage 4 chronic kidney disease ([place]) 05/15/2015  . Membranous glomerulonephritis, stage 4 05/15/2015  . Hyperlipidemia   . Essential hypertension     Past Surgical History:  Procedure Laterality Date  . EYE SURGERY     cataract surgery done at [place]   . LITHOTRIPSY    . submandibular gland removal Left     Prior to Admission medications   Medication Sig Start Date End Date Taking? Authorizing Provider  aspirin EC 81 MG tablet Take 81 mg by mouth daily.    [provider]  atorvastatin (LIPITOR) 40 MG tablet Take 1 tablet (40 mg total) by mouth daily. 06/10/18    Guadalupe Maple, MD  benazepril (LOTENSIN) 20 MG tablet Take 1 tablet (20 mg total) by mouth daily. 06/10/18   Guadalupe Maple, MD  cephALEXin (KEFLEX) 500 MG capsule Take 1 capsule (500 mg total) by mouth 2 (two) times daily for 7 days. 02/27/19 03/06/19  Gregor Hams, MD  Cholecalciferol (VITAMIN D3) 25 MCG (1000 UT) CAPS Take 1 capsule by mouth daily.    [provider]  levothyroxine (SYNTHROID, LEVOTHROID) 175 MCG tablet Take 1 tablet (175 mcg total) by mouth daily before breakfast. 06/10/18   Guadalupe Maple, MD  mycophenolate (CELLCEPT) 250 MG capsule Take 250 mg by mouth 2 (two) times daily.    [provider]  Nutritional Supplements (THERALITH XR PO) Take by mouth 2 (two) times daily. Takes 2 tablets in AM and 2 tablets in PM    [provider]  Omega-3 Fatty Acids (OMEGA 3 PO) Take by mouth 2 (two) times daily.    [provider]  omeprazole (PRILOSEC OTC) 20 MG tablet Take 1 tablet (20 mg total) by mouth daily. 06/10/18   Guadalupe Maple, MD  phenazopyridine (PYRIDIUM) 100 MG tablet Take 1 tablet (100 mg total) by mouth 3 (three) times daily as needed for pain. 02/27/19 02/27/20  Gregor Hams, MD  pyridostigmine (MESTINON) 60 MG tablet Take 60 mg by mouth 2 (two) times daily.     [provider]  Saw Palmetto, Serenoa repens, 450 MG  CAPS Take 1,350 mg by mouth daily.     [provider]    Allergies Patient has no known allergies.  Family History  Problem Relation Age of Onset  . Hypertension Mother   . Thyroid disease Mother   . Breast cancer Mother   . Asthma Father     Social History Social History   Tobacco Use  . Smoking status: Former Smoker    Quit date: 05/14/1969    Years since quitting: 49.8  . Smokeless tobacco: Never Used  Substance Use Topics  . Alcohol use: No  . Drug use: No    Review of Systems Constitutional: No fever/chills Eyes: No visual changes. ENT: No sore throat. Cardiovascular:  Denies chest pain. Respiratory: Denies shortness of breath. Gastrointestinal: No abdominal pain.  No nausea, no vomiting.  No diarrhea.  No constipation. Genitourinary: Positive for hematuria and dysuria Musculoskeletal: Negative for neck pain.  Negative for back pain. Integumentary: Negative for rash. Neurological: Negative for headaches, focal weakness or numbness.   ____________________________________________   PHYSICAL EXAM:  VITAL SIGNS: ED Triage Vitals  Enc Vitals Group     BP 02/26/19 2243 (!) 118/58     Pulse Rate 02/26/19 2243 79     Resp 02/26/19 2243 18     Temp 02/26/19 2243 97.9 F (36.6 C)     Temp Source 02/26/19 2243 Oral     SpO2 02/26/19 2243 98 %     Weight 02/26/19 2244 90.7 kg (200 lb)     Height 02/26/19 2244 1.727 m (5\' 8" )     Head Circumference --      Peak Flow --      Pain Score 02/26/19 2244 0     Pain Loc --      Pain Edu? --      Excl. in [place]? --     Constitutional: Alert and oriented. Eyes: Conjunctivae are normal.  Mouth/Throat: Mucous membranes are moist. Neck: No stridor.  No meningeal signs.   Cardiovascular: Normal rate, regular rhythm. Good peripheral circulation. Grossly normal heart sounds. Respiratory: Normal respiratory effort.  No retractions. Gastrointestinal: Soft and nontender. No distention.  Musculoskeletal: No lower extremity tenderness nor edema. No gross deformities of extremities. Neurologic:  Normal speech and language. No gross focal neurologic deficits are appreciated.  Skin:  Skin is warm, dry and intact. Psychiatric: Mood and affect are normal. Speech and behavior are normal.  ____________________________________________   LABS (all labs ordered are listed, but only abnormal results are displayed)  Labs Reviewed  URINALYSIS, COMPLETE (UACMP) WITH MICROSCOPIC - Abnormal; Notable for the following components:      Result Value   Color, Urine Fia Hebert (*)    APPearance CLOUDY (*)    Glucose, UA   (*)    Value:  TEST NOT REPORTED DUE TO COLOR INTERFERENCE OF URINE PIGMENT   Hgb urine dipstick   (*)    Value: TEST NOT REPORTED DUE TO COLOR INTERFERENCE OF URINE PIGMENT   Bilirubin Urine   (*)    Value: TEST NOT REPORTED DUE TO COLOR INTERFERENCE OF URINE PIGMENT   Ketones, ur   (*)    Value: TEST NOT REPORTED DUE TO COLOR INTERFERENCE OF URINE PIGMENT   Protein, ur   (*)    Value: TEST NOT REPORTED DUE TO COLOR INTERFERENCE OF URINE PIGMENT   Nitrite   (*)    Value: TEST NOT REPORTED DUE TO COLOR INTERFERENCE OF URINE PIGMENT   Leukocytes,Ua   (*)  Value: TEST NOT REPORTED DUE TO COLOR INTERFERENCE OF URINE PIGMENT   Bacteria, UA MANY (*)    All other components within normal limits  CBC WITH DIFFERENTIAL/PLATELET - Abnormal; Notable for the following components:   Abs Immature Granulocytes 0.09 (*)    All other components within normal limits  BASIC METABOLIC PANEL - Abnormal; Notable for the following components:   Glucose, Bld 148 (*)    BUN 34 (*)    Creatinine, Ser 2.86 (*)    GFR calc non Af Amer 21 (*)    GFR calc Af Amer 24 (*)    All other components within normal limits  URINE CULTURE   ____________________________________________  _________________________  RADIOLOGY I, Gregor Hams, personally viewed and evaluated these images (plain radiographs) as part of my medical decision making, as well as reviewing the written report by the radiologist.  ED MD interpretation: CT renal revealed left nephrolithiasis no ureteral stones or hydronephrosis.  Official radiology report(s): Ct Renal Stone Study  Result Date: 02/27/2019 CLINICAL DATA:  Flank pain EXAM: CT ABDOMEN AND PELVIS WITHOUT CONTRAST TECHNIQUE: Multidetector CT imaging of the abdomen and pelvis was performed following the standard protocol without IV contrast. COMPARISON:  None. FINDINGS: Lower chest: Lung bases are clear. No effusions. Heart is normal size. Hepatobiliary: Scattered hypodensities in the liver, most  compatible with cysts. Gallbladder unremarkable. Pancreas: No focal abnormality or ductal dilatation. Spleen: No focal abnormality.  Normal size. Adrenals/Urinary Tract: Nonobstructing stone in the midpole of the left kidney. No hydronephrosis. No ureteral stones. Urinary bladder and adrenal glands unremarkable. Stomach/Bowel: Sigmoid and descending colonic diverticulosis. No active diverticulitis. Stomach and small bowel decompressed, unremarkable. Vascular/Lymphatic: Aortic atherosclerosis. No enlarged abdominal or pelvic lymph nodes. Reproductive: Mild prostate enlargement. Other: Bilateral inguinal hernias containing fat. No free fluid or free air. Musculoskeletal: No acute bony abnormality. IMPRESSION: Left nephrolithiasis.  No ureteral stones or hydronephrosis. Left colonic diverticulosis. Aortic atherosclerosis. Prostate enlargement. Bilateral inguinal hernias containing fat. Electronically Signed   By: Rolm Baptise M.D.   On: 02/27/2019 03:27     Procedures   ____________________________________________   INITIAL IMPRESSION / MDM / [place] / ED COURSE  As part of my medical decision making, I reviewed the following data within the electronic MEDICAL RECORD NUMBER   73 year old male presenting with above-stated history and physical exam secondary to dysuria and hematuria with concern for possible hemorrhagic cystitis nephrolithiasis ureterolithiasis.  Bladder scan revealed only 50 mL's of urine.  Patient's urinalysis consistent with possible hemorrhagic cystitis given many bacteria and elevated [name] blood cell count.  Patient given Keflex in the emergency department will be prescribed Keflex and Pyridium for home.  Urine culture is pending.     ____________________________________________  FINAL CLINICAL IMPRESSION(S) / ED DIAGNOSES  Final diagnoses:  Hemorrhagic cystitis  Gross hematuria     MEDICATIONS GIVEN DURING THIS VISIT:  Medications  cephALEXin (KEFLEX) capsule  500 mg (has no administration in time range)     ED Discharge Orders         Ordered    cephALEXin (KEFLEX) 500 MG capsule  2 times daily     02/27/19 0443    phenazopyridine (PYRIDIUM) 100 MG tablet  3 times daily PRN     02/27/19 0444          *Please note:  Selig Wampole was evaluated in Emergency Department on 02/27/2019 for the symptoms described in the history of present illness. He was evaluated in the context of  the global COVID-19 pandemic, which necessitated consideration that the patient might be at risk for infection with the SARS-CoV-2 virus that causes COVID-19. Institutional protocols and algorithms that pertain to the evaluation of patients at risk for COVID-19 are in a state of rapid change based on information released by regulatory bodies including the CDC and federal and state organizations. These policies and algorithms were followed during the patient's care in the ED.  Some ED evaluations and interventions may be delayed as a result of limited staffing during the pandemic.*  Note:  This document was prepared using Dragon voice recognition software and may include unintentional dictation errors.   Gregor Hams, MD 02/27/19 [phone]

## 2019-03-01 LAB — URINE CULTURE: Culture: >=100000 — AB

## 2019-03-02 ENCOUNTER — Telehealth: Payer: Self-pay | Admitting: Family Medicine

## 2019-03-02 NOTE — Telephone Encounter (Signed)
Pt presented in office to let provider know that he went to hosp and they told him that he had a uti. States he went Friday 9/4 and they told him to let the provider know. Offered to schedule appt with provider, told him that Jeananne Rama is on vacation. He does not want to schedule with another provider. Says that he would hold off on scheduling.

## 2019-03-03 DIAGNOSIS — R809 Proteinuria, unspecified: Secondary | ICD-10-CM | POA: Diagnosis not present

## 2019-03-03 DIAGNOSIS — N052 Unspecified nephritic syndrome with diffuse membranous glomerulonephritis: Secondary | ICD-10-CM | POA: Diagnosis not present

## 2019-03-03 DIAGNOSIS — N2581 Secondary hyperparathyroidism of renal origin: Secondary | ICD-10-CM | POA: Diagnosis not present

## 2019-03-03 DIAGNOSIS — N050 Unspecified nephritic syndrome with minor glomerular abnormality: Secondary | ICD-10-CM | POA: Diagnosis not present

## 2019-03-09 DIAGNOSIS — N418 Other inflammatory diseases of prostate: Secondary | ICD-10-CM | POA: Diagnosis not present

## 2019-03-09 DIAGNOSIS — Z6834 Body mass index (BMI) 34.0-34.9, adult: Secondary | ICD-10-CM | POA: Diagnosis not present

## 2019-03-09 DIAGNOSIS — N390 Urinary tract infection, site not specified: Secondary | ICD-10-CM | POA: Diagnosis not present

## 2019-04-15 DIAGNOSIS — N052 Unspecified nephritic syndrome with diffuse membranous glomerulonephritis: Secondary | ICD-10-CM | POA: Diagnosis not present

## 2019-04-15 DIAGNOSIS — I10 Essential (primary) hypertension: Secondary | ICD-10-CM | POA: Diagnosis not present

## 2019-04-15 DIAGNOSIS — E785 Hyperlipidemia, unspecified: Secondary | ICD-10-CM | POA: Diagnosis not present

## 2019-04-15 DIAGNOSIS — N049 Nephrotic syndrome with unspecified morphologic changes: Secondary | ICD-10-CM | POA: Diagnosis not present

## 2019-04-19 DIAGNOSIS — I129 Hypertensive chronic kidney disease with stage 1 through stage 4 chronic kidney disease, or unspecified chronic kidney disease: Secondary | ICD-10-CM | POA: Diagnosis not present

## 2019-04-19 DIAGNOSIS — K219 Gastro-esophageal reflux disease without esophagitis: Secondary | ICD-10-CM | POA: Insufficient documentation

## 2019-04-19 DIAGNOSIS — R809 Proteinuria, unspecified: Secondary | ICD-10-CM | POA: Diagnosis not present

## 2019-04-19 DIAGNOSIS — N2581 Secondary hyperparathyroidism of renal origin: Secondary | ICD-10-CM | POA: Diagnosis not present

## 2019-04-19 DIAGNOSIS — N052 Unspecified nephritic syndrome with diffuse membranous glomerulonephritis: Secondary | ICD-10-CM | POA: Diagnosis not present

## 2019-04-19 DIAGNOSIS — N184 Chronic kidney disease, stage 4 (severe): Secondary | ICD-10-CM | POA: Diagnosis not present

## 2019-05-18 DIAGNOSIS — C4441 Basal cell carcinoma of skin of scalp and neck: Secondary | ICD-10-CM | POA: Diagnosis not present

## 2019-05-18 DIAGNOSIS — L570 Actinic keratosis: Secondary | ICD-10-CM | POA: Diagnosis not present

## 2019-05-18 DIAGNOSIS — X32XXXA Exposure to sunlight, initial encounter: Secondary | ICD-10-CM | POA: Diagnosis not present

## 2019-05-18 DIAGNOSIS — D485 Neoplasm of uncertain behavior of skin: Secondary | ICD-10-CM | POA: Diagnosis not present

## 2019-05-27 DIAGNOSIS — N052 Unspecified nephritic syndrome with diffuse membranous glomerulonephritis: Secondary | ICD-10-CM | POA: Diagnosis not present

## 2019-05-27 DIAGNOSIS — N2581 Secondary hyperparathyroidism of renal origin: Secondary | ICD-10-CM | POA: Diagnosis not present

## 2019-05-27 DIAGNOSIS — R809 Proteinuria, unspecified: Secondary | ICD-10-CM | POA: Diagnosis not present

## 2019-05-27 DIAGNOSIS — N184 Chronic kidney disease, stage 4 (severe): Secondary | ICD-10-CM | POA: Diagnosis not present

## 2019-05-31 DIAGNOSIS — C4441 Basal cell carcinoma of skin of scalp and neck: Secondary | ICD-10-CM | POA: Diagnosis not present

## 2019-06-14 ENCOUNTER — Encounter: Payer: PPO | Admitting: Family Medicine

## 2019-06-25 DIAGNOSIS — C884 Extranodal marginal zone b-cell lymphoma of mucosa-associated lymphoid tissue (malt-lymphoma) not having achieved remission: Secondary | ICD-10-CM

## 2019-06-25 HISTORY — DX: Extranodal marginal zone B-cell lymphoma of mucosa-associated lymphoid tissue (MALT-lymphoma): C88.4

## 2019-06-25 HISTORY — DX: Extranodal marginal zone b-cell lymphoma of mucosa-associated lymphoid tissue (malt-lymphoma) not having achieved remission: C88.40

## 2019-07-07 DIAGNOSIS — R809 Proteinuria, unspecified: Secondary | ICD-10-CM | POA: Diagnosis not present

## 2019-07-07 DIAGNOSIS — I129 Hypertensive chronic kidney disease with stage 1 through stage 4 chronic kidney disease, or unspecified chronic kidney disease: Secondary | ICD-10-CM | POA: Diagnosis not present

## 2019-07-07 DIAGNOSIS — N2581 Secondary hyperparathyroidism of renal origin: Secondary | ICD-10-CM | POA: Diagnosis not present

## 2019-07-07 DIAGNOSIS — N184 Chronic kidney disease, stage 4 (severe): Secondary | ICD-10-CM | POA: Diagnosis not present

## 2019-07-07 DIAGNOSIS — N052 Unspecified nephritic syndrome with diffuse membranous glomerulonephritis: Secondary | ICD-10-CM | POA: Diagnosis not present

## 2019-07-13 DIAGNOSIS — I5043 Acute on chronic combined systolic (congestive) and diastolic (congestive) heart failure: Secondary | ICD-10-CM | POA: Diagnosis not present

## 2019-07-13 DIAGNOSIS — N052 Unspecified nephritic syndrome with diffuse membranous glomerulonephritis: Secondary | ICD-10-CM | POA: Diagnosis not present

## 2019-07-13 DIAGNOSIS — R809 Proteinuria, unspecified: Secondary | ICD-10-CM | POA: Diagnosis not present

## 2019-07-13 DIAGNOSIS — N184 Chronic kidney disease, stage 4 (severe): Secondary | ICD-10-CM | POA: Diagnosis not present

## 2019-07-13 DIAGNOSIS — I129 Hypertensive chronic kidney disease with stage 1 through stage 4 chronic kidney disease, or unspecified chronic kidney disease: Secondary | ICD-10-CM | POA: Diagnosis not present

## 2019-07-13 DIAGNOSIS — N2581 Secondary hyperparathyroidism of renal origin: Secondary | ICD-10-CM | POA: Diagnosis not present

## 2019-07-27 ENCOUNTER — Encounter: Payer: Self-pay | Admitting: Family Medicine

## 2019-07-27 ENCOUNTER — Telehealth: Payer: Self-pay

## 2019-07-27 ENCOUNTER — Ambulatory Visit (INDEPENDENT_AMBULATORY_CARE_PROVIDER_SITE_OTHER): Payer: PPO | Admitting: Family Medicine

## 2019-07-27 VITALS — BP 112/72 | HR 77 | Temp 98.3°F

## 2019-07-27 DIAGNOSIS — E7800 Pure hypercholesterolemia, unspecified: Secondary | ICD-10-CM | POA: Diagnosis not present

## 2019-07-27 DIAGNOSIS — N184 Chronic kidney disease, stage 4 (severe): Secondary | ICD-10-CM | POA: Diagnosis not present

## 2019-07-27 DIAGNOSIS — E039 Hypothyroidism, unspecified: Secondary | ICD-10-CM

## 2019-07-27 DIAGNOSIS — I10 Essential (primary) hypertension: Secondary | ICD-10-CM

## 2019-07-27 DIAGNOSIS — N400 Enlarged prostate without lower urinary tract symptoms: Secondary | ICD-10-CM | POA: Diagnosis not present

## 2019-07-27 MED ORDER — OMEPRAZOLE MAGNESIUM 20 MG PO TBEC: 20.0000 mg | DELAYED_RELEASE_TABLET | Freq: Every day | ORAL | 1 refills | Status: DC

## 2019-07-27 MED ORDER — BENAZEPRIL HCL 20 MG PO TABS: 20.0000 mg | ORAL_TABLET | Freq: Every day | ORAL | 1 refills | Status: DC

## 2019-07-27 MED ORDER — ATORVASTATIN CALCIUM 40 MG PO TABS: 40.0000 mg | ORAL_TABLET | Freq: Every day | ORAL | 1 refills | Status: DC

## 2019-07-27 MED ORDER — LEVOTHYROXINE SODIUM 175 MCG PO TABS: 175.0000 ug | ORAL_TABLET | Freq: Every day | ORAL | 0 refills | Status: DC

## 2019-07-27 NOTE — Telephone Encounter (Signed)
Copied from [place] [id number]. Topic: General - Inquiry >> Jul 27, 2019  1:41 PM Mathis Bud wrote: Reason for CRM: Patient called stating pharmacy called patient to let him know medication atorvastatin (LIPITOR) 40 MG tablet  is not at pharmacy anymore, the manufacture is not making medication anymore. Pharmacy sent a request for the generic. Patient call back [phone]

## 2019-07-27 NOTE — Progress Notes (Signed)
BP 112/72   Pulse 77   Temp 98.3 F (36.8 C)   SpO2 93%    Subjective:    Patient ID: Bill Aguilar, male    DOB: 11-15-1945, 74 y.o.   MRN: [id number]  HPI: Bill Aguilar is a 74 y.o. male  Chief Complaint  Patient presents with  . Hyperlipidemia  . Hypertension  . Hypothyroidism  . Benign Prostatic Hypertrophy   HYPERTENSION / [place] Satisfied with current treatment? yes Duration of hypertension: chronic BP monitoring frequency: not checking BP range:  BP medication side effects: no Past BP meds: benazepril Duration of hyperlipidemia: chronic Cholesterol medication side effects: no Cholesterol supplements: fish oil Past cholesterol medications: atorvastatin Medication compliance: excellent compliance Aspirin: yes Recent stressors: yes Recurrent headaches: no Visual changes: no Palpitations: no Dyspnea: no Chest pain: no Lower extremity edema: no Dizzy/lightheaded: no  HYPOTHYROIDISM Thyroid control status:controlled Satisfied with current treatment? yes Medication side effects: no Medication compliance: excellent compliance Etiology of hypothyroidism:  Recent dose adjustment:no Fatigue: no Cold intolerance: no Heat intolerance: no Weight gain: no Weight loss: no Constipation: no Diarrhea/loose stools: no Palpitations: no Lower extremity edema: no Anxiety/depressed mood: no  BPH BPH status: controlled Satisfied with current treatment?: yes Medication side effects: no Medication compliance: excellent compliance Duration: chronic Nocturia: 1/night Urinary frequency:no Incomplete voiding: no Urgency: no Weak urinary stream: no Straining to start stream: no Dysuria: no Onset: gradual Severity: mild  Relevant past medical, surgical, family and social history reviewed and updated as indicated. Interim medical history since our last visit reviewed. Allergies and medications reviewed and updated.  Review of Systems    Constitutional: Negative.   Respiratory: Negative.   Cardiovascular: Negative.   Musculoskeletal: Negative.   Psychiatric/Behavioral: Negative.     Per HPI unless specifically indicated above     Objective:    BP 112/72   Pulse 77   Temp 98.3 F (36.8 C)   SpO2 93%   Wt Readings from Last 3 Encounters:  02/26/19 200 lb (90.7 kg)  12/21/18 198 lb 6.4 oz (90 kg)  06/10/18 208 lb 3.2 oz (94.4 kg)    Physical Exam Vitals and nursing note reviewed.  Pulmonary:     Effort: Pulmonary effort is normal. No respiratory distress.     Comments: Speaking in full sentences Neurological:     Mental Status: He is alert.  Psychiatric:        Mood and Affect: Mood normal.        Behavior: Behavior normal.        Thought Content: Thought content normal.        Judgment: Judgment normal.     Results for orders placed or performed during the hospital encounter of 02/27/19  Urine culture   Specimen: Urine, Random  Result Value Ref Range   Specimen Description      URINE, RANDOM Performed at [facility], [address], [place], [place] [address]    Special Requests      NONE Performed at [facility], [address] [place], [place] [address]    Culture >=100,000 COLONIES/mL ESCHERICHIA COLI (A)    Report Status 03/01/2019 FINAL    Organism ID, Bacteria ESCHERICHIA COLI (A)       Susceptibility   Escherichia coli - MIC*    AMPICILLIN >=32 RESISTANT Resistant     CEFAZOLIN 16 SENSITIVE Sensitive     CEFTRIAXONE <=1 SENSITIVE Sensitive     CIPROFLOXACIN <=0.25 SENSITIVE Sensitive  GENTAMICIN <=1 SENSITIVE Sensitive     IMIPENEM <=0.25 SENSITIVE Sensitive     NITROFURANTOIN <=16 SENSITIVE Sensitive     TRIMETH/SULFA >=320 RESISTANT Resistant     AMPICILLIN/SULBACTAM >=32 RESISTANT Resistant     PIP/TAZO <=4 SENSITIVE Sensitive     Extended ESBL NEGATIVE Sensitive     * >=100,000 COLONIES/mL ESCHERICHIA COLI  Urinalysis, Complete w Microscopic   Result Value Ref Range   Color, Urine BROWN (A) YELLOW   APPearance CLOUDY (A) CLEAR   Specific Gravity, Urine 1.017 1.005 - 1.030   pH  5.0 - 8.0    TEST NOT REPORTED DUE TO COLOR INTERFERENCE OF URINE PIGMENT   Glucose, UA (A) NEGATIVE mg/dL    TEST NOT REPORTED DUE TO COLOR INTERFERENCE OF URINE PIGMENT   Hgb urine dipstick (A) NEGATIVE    TEST NOT REPORTED DUE TO COLOR INTERFERENCE OF URINE PIGMENT   Bilirubin Urine (A) NEGATIVE    TEST NOT REPORTED DUE TO COLOR INTERFERENCE OF URINE PIGMENT   Ketones, ur (A) NEGATIVE mg/dL    TEST NOT REPORTED DUE TO COLOR INTERFERENCE OF URINE PIGMENT   Protein, ur (A) NEGATIVE mg/dL    TEST NOT REPORTED DUE TO COLOR INTERFERENCE OF URINE PIGMENT   Nitrite (A) NEGATIVE    TEST NOT REPORTED DUE TO COLOR INTERFERENCE OF URINE PIGMENT   Leukocytes,Ua (A) NEGATIVE    TEST NOT REPORTED DUE TO COLOR INTERFERENCE OF URINE PIGMENT   WBC, UA 21-50 0 - 5 WBC/hpf   Bacteria, UA MANY (A) NONE SEEN   Squamous Epithelial / LPF NONE SEEN 0 - 5  CBC with Differential  Result Value Ref Range   WBC 10.1 4.0 - 10.5 K/uL   RBC 5.16 4.22 - 5.81 MIL/uL   Hemoglobin 13.9 13.0 - 17.0 g/dL   HCT 45.0 39.0 - 52.0 %   MCV 87.2 80.0 - 100.0 fL   MCH 26.9 26.0 - 34.0 pg   MCHC 30.9 30.0 - 36.0 g/dL   RDW 13.3 11.5 - 15.5 %   Platelets 228 150 - 400 K/uL   nRBC 0.0 0.0 - 0.2 %   Neutrophils Relative % 71 %   Neutro Abs 7.2 1.7 - 7.7 K/uL   Lymphocytes Relative 16 %   Lymphs Abs 1.6 0.7 - 4.0 K/uL   Monocytes Relative 10 %   Monocytes Absolute 1.0 0.1 - 1.0 K/uL   Eosinophils Relative 2 %   Eosinophils Absolute 0.2 0.0 - 0.5 K/uL   Basophils Relative 0 %   Basophils Absolute 0.0 0.0 - 0.1 K/uL   Immature Granulocytes 1 %   Abs Immature Granulocytes 0.09 (H) 0.00 - 0.07 K/uL  Basic metabolic panel  Result Value Ref Range   Sodium 139 135 - 145 mmol/L   Potassium 4.3 3.5 - 5.1 mmol/L   Chloride 107 98 - 111 mmol/L   CO2 23 22 - 32 mmol/L   Glucose, Bld  148 (H) 70 - 99 mg/dL   BUN 34 (H) 8 - 23 mg/dL   Creatinine, Ser 2.86 (H) 0.61 - 1.24 mg/dL   Calcium 8.9 8.9 - 10.3 mg/dL   GFR calc non Af Amer 21 (L) >60 mL/min   GFR calc Af Amer 24 (L) >60 mL/min   Anion gap 9 5 - 15      Assessment & Plan:   Problem List Items Addressed This Visit      Cardiovascular and Mediastinum   Essential hypertension - Primary  Under good control on current regimen. Continue current regimen. Continue to monitor. Call with any concerns. Refills given. Labs to be drawn next week.        Relevant Medications   benazepril (LOTENSIN) 20 MG tablet   atorvastatin (LIPITOR) 40 MG tablet   Other Relevant Orders   CBC with Differential/Platelet   Comprehensive metabolic panel   Microalbumin, Urine Waived     Endocrine   Hypothyroidism    Will recheck labs next week and adjust dose as needed. Call with any concerns.      Relevant Medications   levothyroxine (SYNTHROID) 175 MCG tablet   Other Relevant Orders   CBC with Differential/Platelet   Comprehensive metabolic panel   TSH     Genitourinary   Stage 4 chronic kidney disease ([place])    Continue to follow with nephrology. Stable. Will recheck levels next week. Call with any concerns.       BPH (benign prostatic hyperplasia)    Under good control on current regimen. Continue current regimen. Continue to monitor. Call with any concerns. Refills given. Labs to be drawn next week.       Relevant Orders   CBC with Differential/Platelet   Comprehensive metabolic panel     Other   Hyperlipidemia    Under good control on current regimen. Continue current regimen. Continue to monitor. Call with any concerns. Refills given. Labs to be drawn next week.       Relevant Medications   benazepril (LOTENSIN) 20 MG tablet   atorvastatin (LIPITOR) 40 MG tablet   Other Relevant Orders   CBC with Differential/Platelet   Comprehensive metabolic panel   Lipid Panel w/o Chol/HDL Ratio       Follow up  plan: Return in about 6 months (around 01/24/2020) for Physical.    . This visit was completed via telephone due to the restrictions of the COVID-19 pandemic. All issues as above were discussed and addressed but no physical exam was performed. If it was felt that the patient should be evaluated in the office, they were directed there. The patient verbally consented to this visit. Patient was unable to complete an audio/visual visit due to Lack of equipment. Due to the catastrophic nature of the COVID-19 pandemic, this visit was done through audio contact only. . Location of the patient: home . Location of the provider: work . Those involved with this call:  . Provider: Park Liter, DO . CMA: Tiffany Reel, CMA . Front Desk/Registration: Don Perking  . Time spent on call: 25 minutes on the phone discussing health concerns. 40 minutes total spent in review of patient's record and preparation of their chart.

## 2019-07-27 NOTE — Assessment & Plan Note (Signed)
Will recheck labs next week and adjust dose as needed. Call with any concerns.

## 2019-07-27 NOTE — Assessment & Plan Note (Signed)
Under good control on current regimen. Continue current regimen. Continue to monitor. Call with any concerns. Refills given. Labs to be drawn next week.   

## 2019-07-27 NOTE — Assessment & Plan Note (Signed)
Continue to follow with nephrology. Stable. Will recheck levels next week. Call with any concerns.

## 2019-07-27 NOTE — Telephone Encounter (Signed)
no

## 2019-07-27 NOTE — Telephone Encounter (Signed)
Mr. Hockey advised.

## 2019-07-27 NOTE — Telephone Encounter (Signed)
He is currently a patient at [facility]  Copied from [place]. Topic: Appointment Scheduling - Scheduling Inquiry for Clinic >> Jul 27, 2019 10:39 AM Rainey Pines A wrote: Patient wants to know if Dr Caryn Section would be willing to accept him as a new patient. Advised patient that he is not, patient insisted I sent message. Best contact [phone]

## 2019-08-02 DIAGNOSIS — N200 Calculus of kidney: Secondary | ICD-10-CM | POA: Insufficient documentation

## 2019-08-02 DIAGNOSIS — N184 Chronic kidney disease, stage 4 (severe): Secondary | ICD-10-CM | POA: Diagnosis not present

## 2019-08-02 DIAGNOSIS — N052 Unspecified nephritic syndrome with diffuse membranous glomerulonephritis: Secondary | ICD-10-CM | POA: Diagnosis not present

## 2019-08-02 DIAGNOSIS — E7800 Pure hypercholesterolemia, unspecified: Secondary | ICD-10-CM | POA: Diagnosis not present

## 2019-08-02 DIAGNOSIS — I700 Atherosclerosis of aorta: Secondary | ICD-10-CM | POA: Diagnosis not present

## 2019-08-02 DIAGNOSIS — G7000 Myasthenia gravis without (acute) exacerbation: Secondary | ICD-10-CM | POA: Diagnosis not present

## 2019-08-02 DIAGNOSIS — E039 Hypothyroidism, unspecified: Secondary | ICD-10-CM | POA: Diagnosis not present

## 2019-08-02 DIAGNOSIS — I129 Hypertensive chronic kidney disease with stage 1 through stage 4 chronic kidney disease, or unspecified chronic kidney disease: Secondary | ICD-10-CM | POA: Diagnosis not present

## 2019-08-02 DIAGNOSIS — Z0000 Encounter for general adult medical examination without abnormal findings: Secondary | ICD-10-CM | POA: Diagnosis not present

## 2019-08-04 ENCOUNTER — Other Ambulatory Visit: Payer: PPO

## 2019-08-05 DIAGNOSIS — E7800 Pure hypercholesterolemia, unspecified: Secondary | ICD-10-CM | POA: Diagnosis not present

## 2019-08-05 DIAGNOSIS — I129 Hypertensive chronic kidney disease with stage 1 through stage 4 chronic kidney disease, or unspecified chronic kidney disease: Secondary | ICD-10-CM | POA: Diagnosis not present

## 2019-08-05 DIAGNOSIS — N184 Chronic kidney disease, stage 4 (severe): Secondary | ICD-10-CM | POA: Diagnosis not present

## 2019-08-05 DIAGNOSIS — E039 Hypothyroidism, unspecified: Secondary | ICD-10-CM | POA: Diagnosis not present

## 2019-08-05 DIAGNOSIS — I700 Atherosclerosis of aorta: Secondary | ICD-10-CM | POA: Diagnosis not present

## 2019-08-05 DIAGNOSIS — Z125 Encounter for screening for malignant neoplasm of prostate: Secondary | ICD-10-CM | POA: Diagnosis not present

## 2019-08-17 DIAGNOSIS — N401 Enlarged prostate with lower urinary tract symptoms: Secondary | ICD-10-CM | POA: Diagnosis not present

## 2019-08-17 DIAGNOSIS — R972 Elevated prostate specific antigen [PSA]: Secondary | ICD-10-CM | POA: Diagnosis not present

## 2019-08-17 DIAGNOSIS — E6609 Other obesity due to excess calories: Secondary | ICD-10-CM | POA: Diagnosis not present

## 2019-08-23 ENCOUNTER — Other Ambulatory Visit: Payer: Self-pay | Admitting: Urology

## 2019-08-23 DIAGNOSIS — R972 Elevated prostate specific antigen [PSA]: Secondary | ICD-10-CM

## 2019-08-25 DIAGNOSIS — I129 Hypertensive chronic kidney disease with stage 1 through stage 4 chronic kidney disease, or unspecified chronic kidney disease: Secondary | ICD-10-CM | POA: Diagnosis not present

## 2019-08-25 DIAGNOSIS — N184 Chronic kidney disease, stage 4 (severe): Secondary | ICD-10-CM | POA: Diagnosis not present

## 2019-08-25 DIAGNOSIS — N2581 Secondary hyperparathyroidism of renal origin: Secondary | ICD-10-CM | POA: Diagnosis not present

## 2019-08-25 DIAGNOSIS — N052 Unspecified nephritic syndrome with diffuse membranous glomerulonephritis: Secondary | ICD-10-CM | POA: Diagnosis not present

## 2019-08-25 DIAGNOSIS — R809 Proteinuria, unspecified: Secondary | ICD-10-CM | POA: Diagnosis not present

## 2019-09-01 ENCOUNTER — Other Ambulatory Visit: Payer: Self-pay

## 2019-09-01 ENCOUNTER — Ambulatory Visit
Admission: RE | Admit: 2019-09-01 | Discharge: 2019-09-01 | Disposition: A | Discharge status: Home / Self Care | Payer: PPO | Source: Ambulatory Visit | Attending: Urology | Admitting: Urology

## 2019-09-01 DIAGNOSIS — R972 Elevated prostate specific antigen [PSA]: Secondary | ICD-10-CM | POA: Diagnosis not present

## 2019-09-01 DIAGNOSIS — N400 Enlarged prostate without lower urinary tract symptoms: Secondary | ICD-10-CM | POA: Diagnosis not present

## 2019-09-06 DIAGNOSIS — R972 Elevated prostate specific antigen [PSA]: Secondary | ICD-10-CM | POA: Diagnosis not present

## 2019-09-06 DIAGNOSIS — N401 Enlarged prostate with lower urinary tract symptoms: Secondary | ICD-10-CM | POA: Diagnosis not present

## 2019-09-09 DIAGNOSIS — R972 Elevated prostate specific antigen [PSA]: Secondary | ICD-10-CM | POA: Diagnosis not present

## 2019-09-09 DIAGNOSIS — N401 Enlarged prostate with lower urinary tract symptoms: Secondary | ICD-10-CM | POA: Diagnosis not present

## 2019-09-14 DIAGNOSIS — N401 Enlarged prostate with lower urinary tract symptoms: Secondary | ICD-10-CM | POA: Diagnosis not present

## 2019-09-14 DIAGNOSIS — N5201 Erectile dysfunction due to arterial insufficiency: Secondary | ICD-10-CM | POA: Diagnosis not present

## 2019-09-14 DIAGNOSIS — E6609 Other obesity due to excess calories: Secondary | ICD-10-CM | POA: Diagnosis not present

## 2019-09-15 DIAGNOSIS — X32XXXA Exposure to sunlight, initial encounter: Secondary | ICD-10-CM | POA: Diagnosis not present

## 2019-09-15 DIAGNOSIS — D2272 Melanocytic nevi of left lower limb, including hip: Secondary | ICD-10-CM | POA: Diagnosis not present

## 2019-09-15 DIAGNOSIS — D485 Neoplasm of uncertain behavior of skin: Secondary | ICD-10-CM | POA: Diagnosis not present

## 2019-09-15 DIAGNOSIS — Z85828 Personal history of other malignant neoplasm of skin: Secondary | ICD-10-CM | POA: Diagnosis not present

## 2019-09-15 DIAGNOSIS — Z85820 Personal history of malignant melanoma of skin: Secondary | ICD-10-CM | POA: Diagnosis not present

## 2019-09-15 DIAGNOSIS — L570 Actinic keratosis: Secondary | ICD-10-CM | POA: Diagnosis not present

## 2019-09-15 DIAGNOSIS — C44619 Basal cell carcinoma of skin of left upper limb, including shoulder: Secondary | ICD-10-CM | POA: Diagnosis not present

## 2019-09-15 DIAGNOSIS — D2262 Melanocytic nevi of left upper limb, including shoulder: Secondary | ICD-10-CM | POA: Diagnosis not present

## 2019-09-15 DIAGNOSIS — C44311 Basal cell carcinoma of skin of nose: Secondary | ICD-10-CM | POA: Diagnosis not present

## 2019-09-15 DIAGNOSIS — D2271 Melanocytic nevi of right lower limb, including hip: Secondary | ICD-10-CM | POA: Diagnosis not present

## 2019-09-15 DIAGNOSIS — D2261 Melanocytic nevi of right upper limb, including shoulder: Secondary | ICD-10-CM | POA: Diagnosis not present

## 2019-09-16 DIAGNOSIS — N2581 Secondary hyperparathyroidism of renal origin: Secondary | ICD-10-CM | POA: Diagnosis not present

## 2019-09-16 DIAGNOSIS — I129 Hypertensive chronic kidney disease with stage 1 through stage 4 chronic kidney disease, or unspecified chronic kidney disease: Secondary | ICD-10-CM | POA: Diagnosis not present

## 2019-09-16 DIAGNOSIS — R809 Proteinuria, unspecified: Secondary | ICD-10-CM | POA: Diagnosis not present

## 2019-09-16 DIAGNOSIS — N184 Chronic kidney disease, stage 4 (severe): Secondary | ICD-10-CM | POA: Diagnosis not present

## 2019-09-16 DIAGNOSIS — N052 Unspecified nephritic syndrome with diffuse membranous glomerulonephritis: Secondary | ICD-10-CM | POA: Diagnosis not present

## 2019-09-28 DIAGNOSIS — N052 Unspecified nephritic syndrome with diffuse membranous glomerulonephritis: Secondary | ICD-10-CM | POA: Diagnosis not present

## 2019-09-28 DIAGNOSIS — I129 Hypertensive chronic kidney disease with stage 1 through stage 4 chronic kidney disease, or unspecified chronic kidney disease: Secondary | ICD-10-CM | POA: Diagnosis not present

## 2019-09-28 DIAGNOSIS — N2581 Secondary hyperparathyroidism of renal origin: Secondary | ICD-10-CM | POA: Diagnosis not present

## 2019-09-28 DIAGNOSIS — R809 Proteinuria, unspecified: Secondary | ICD-10-CM | POA: Diagnosis not present

## 2019-09-28 DIAGNOSIS — N184 Chronic kidney disease, stage 4 (severe): Secondary | ICD-10-CM | POA: Diagnosis not present

## 2019-10-13 DIAGNOSIS — N2581 Secondary hyperparathyroidism of renal origin: Secondary | ICD-10-CM | POA: Diagnosis not present

## 2019-10-13 DIAGNOSIS — R809 Proteinuria, unspecified: Secondary | ICD-10-CM | POA: Diagnosis not present

## 2019-10-13 DIAGNOSIS — N184 Chronic kidney disease, stage 4 (severe): Secondary | ICD-10-CM | POA: Diagnosis not present

## 2019-10-13 DIAGNOSIS — I129 Hypertensive chronic kidney disease with stage 1 through stage 4 chronic kidney disease, or unspecified chronic kidney disease: Secondary | ICD-10-CM | POA: Diagnosis not present

## 2019-10-13 DIAGNOSIS — N052 Unspecified nephritic syndrome with diffuse membranous glomerulonephritis: Secondary | ICD-10-CM | POA: Diagnosis not present

## 2019-10-25 ENCOUNTER — Other Ambulatory Visit: Payer: Self-pay | Admitting: Family Medicine

## 2019-10-25 DIAGNOSIS — E039 Hypothyroidism, unspecified: Secondary | ICD-10-CM

## 2019-10-26 NOTE — Telephone Encounter (Signed)
Yes please

## 2019-10-26 NOTE — Telephone Encounter (Signed)
Called patient to see if he could come in to get TSH checked, he said he would have to check his calendar and get back with [place].

## 2019-10-26 NOTE — Telephone Encounter (Signed)
I seen that as well, but he seen you like a couple days before that. Do you want me to call him back and see?

## 2019-10-26 NOTE — Telephone Encounter (Signed)
It looks like he's seeing Dr. Ouida Sills. Is he our patient?

## 2019-11-03 DIAGNOSIS — C44619 Basal cell carcinoma of skin of left upper limb, including shoulder: Secondary | ICD-10-CM | POA: Diagnosis not present

## 2019-11-11 DIAGNOSIS — C44311 Basal cell carcinoma of skin of nose: Secondary | ICD-10-CM | POA: Diagnosis not present

## 2019-12-02 ENCOUNTER — Emergency Department
Admission: EM | Admit: 2019-12-02 | Discharge: 2019-12-02 | Discharge status: Against Medical Advice | Payer: PPO | Source: Home / Self Care

## 2019-12-02 ENCOUNTER — Encounter: Payer: Self-pay | Admitting: Emergency Medicine

## 2019-12-02 ENCOUNTER — Emergency Department
Admission: EM | Admit: 2019-12-02 | Discharge: 2019-12-02 | Disposition: A | Discharge status: Home / Self Care | Payer: PPO | Attending: Emergency Medicine | Admitting: Emergency Medicine

## 2019-12-02 ENCOUNTER — Other Ambulatory Visit: Payer: Self-pay

## 2019-12-02 DIAGNOSIS — E785 Hyperlipidemia, unspecified: Secondary | ICD-10-CM | POA: Diagnosis not present

## 2019-12-02 DIAGNOSIS — Z79899 Other long term (current) drug therapy: Secondary | ICD-10-CM | POA: Diagnosis not present

## 2019-12-02 DIAGNOSIS — K920 Hematemesis: Secondary | ICD-10-CM | POA: Diagnosis not present

## 2019-12-02 DIAGNOSIS — N400 Enlarged prostate without lower urinary tract symptoms: Secondary | ICD-10-CM | POA: Diagnosis not present

## 2019-12-02 DIAGNOSIS — Z5321 Procedure and treatment not carried out due to patient leaving prior to being seen by health care provider: Secondary | ICD-10-CM | POA: Diagnosis not present

## 2019-12-02 DIAGNOSIS — K449 Diaphragmatic hernia without obstruction or gangrene: Secondary | ICD-10-CM | POA: Diagnosis not present

## 2019-12-02 DIAGNOSIS — R1110 Vomiting, unspecified: Secondary | ICD-10-CM | POA: Diagnosis not present

## 2019-12-02 DIAGNOSIS — N184 Chronic kidney disease, stage 4 (severe): Secondary | ICD-10-CM | POA: Diagnosis not present

## 2019-12-02 DIAGNOSIS — K222 Esophageal obstruction: Secondary | ICD-10-CM | POA: Diagnosis not present

## 2019-12-02 DIAGNOSIS — Z87891 Personal history of nicotine dependence: Secondary | ICD-10-CM | POA: Diagnosis not present

## 2019-12-02 DIAGNOSIS — D62 Acute posthemorrhagic anemia: Secondary | ICD-10-CM | POA: Diagnosis not present

## 2019-12-02 DIAGNOSIS — C8589 Other specified types of non-Hodgkin lymphoma, extranodal and solid organ sites: Secondary | ICD-10-CM | POA: Diagnosis not present

## 2019-12-02 DIAGNOSIS — G7000 Myasthenia gravis without (acute) exacerbation: Secondary | ICD-10-CM | POA: Diagnosis not present

## 2019-12-02 DIAGNOSIS — R197 Diarrhea, unspecified: Secondary | ICD-10-CM | POA: Diagnosis not present

## 2019-12-02 DIAGNOSIS — K219 Gastro-esophageal reflux disease without esophagitis: Secondary | ICD-10-CM | POA: Diagnosis not present

## 2019-12-02 DIAGNOSIS — K922 Gastrointestinal hemorrhage, unspecified: Secondary | ICD-10-CM | POA: Diagnosis not present

## 2019-12-02 DIAGNOSIS — Z7982 Long term (current) use of aspirin: Secondary | ICD-10-CM | POA: Diagnosis not present

## 2019-12-02 DIAGNOSIS — I129 Hypertensive chronic kidney disease with stage 1 through stage 4 chronic kidney disease, or unspecified chronic kidney disease: Secondary | ICD-10-CM | POA: Diagnosis not present

## 2019-12-02 DIAGNOSIS — K5730 Diverticulosis of large intestine without perforation or abscess without bleeding: Secondary | ICD-10-CM | POA: Diagnosis not present

## 2019-12-02 DIAGNOSIS — Z20822 Contact with and (suspected) exposure to covid-19: Secondary | ICD-10-CM | POA: Diagnosis not present

## 2019-12-02 DIAGNOSIS — G629 Polyneuropathy, unspecified: Secondary | ICD-10-CM | POA: Diagnosis not present

## 2019-12-02 DIAGNOSIS — R6883 Chills (without fever): Secondary | ICD-10-CM | POA: Diagnosis not present

## 2019-12-02 DIAGNOSIS — K254 Chronic or unspecified gastric ulcer with hemorrhage: Secondary | ICD-10-CM | POA: Diagnosis not present

## 2019-12-02 DIAGNOSIS — K921 Melena: Secondary | ICD-10-CM | POA: Diagnosis not present

## 2019-12-02 DIAGNOSIS — N200 Calculus of kidney: Secondary | ICD-10-CM | POA: Diagnosis not present

## 2019-12-02 DIAGNOSIS — E039 Hypothyroidism, unspecified: Secondary | ICD-10-CM | POA: Diagnosis not present

## 2019-12-02 LAB — CBC WITH DIFFERENTIAL/PLATELET
Abs Immature Granulocytes: 0.15 10*3/uL — ABNORMAL HIGH (ref 0.00–0.07)
Basophils Absolute: 0 10*3/uL (ref 0.0–0.1)
Basophils Relative: 0 %
Eosinophils Absolute: 0.1 10*3/uL (ref 0.0–0.5)
Eosinophils Relative: 1 %
HCT: 39.3 % (ref 39.0–52.0)
Hemoglobin: 12.5 g/dL — ABNORMAL LOW (ref 13.0–17.0)
Immature Granulocytes: 2 %
Lymphocytes Relative: 17 %
Lymphs Abs: 1.5 10*3/uL (ref 0.7–4.0)
MCH: 28.1 pg (ref 26.0–34.0)
MCHC: 31.8 g/dL (ref 30.0–36.0)
MCV: 88.3 fL (ref 80.0–100.0)
Monocytes Absolute: 0.8 10*3/uL (ref 0.1–1.0)
Monocytes Relative: 9 %
Neutro Abs: 6.4 10*3/uL (ref 1.7–7.7)
Neutrophils Relative %: 71 %
Platelets: 222 10*3/uL (ref 150–400)
RBC: 4.45 MIL/uL (ref 4.22–5.81)
RDW: 14.5 % (ref 11.5–15.5)
WBC: 8.9 10*3/uL (ref 4.0–10.5)
nRBC: 0 % (ref 0.0–0.2)

## 2019-12-02 LAB — COMPREHENSIVE METABOLIC PANEL
ALT: 20 U/L (ref 0–44)
AST: 16 U/L (ref 15–41)
Albumin: 3.7 g/dL (ref 3.5–5.0)
Alkaline Phosphatase: 65 U/L (ref 38–126)
Anion gap: 11 (ref 5–15)
BUN: 60 mg/dL — ABNORMAL HIGH (ref 8–23)
CO2: 23 mmol/L (ref 22–32)
Calcium: 9 mg/dL (ref 8.9–10.3)
Chloride: 104 mmol/L (ref 98–111)
Creatinine, Ser: 2.75 mg/dL — ABNORMAL HIGH (ref 0.61–1.24)
GFR calc Af Amer: 25 mL/min — ABNORMAL LOW (ref >60)
GFR calc non Af Amer: 22 mL/min — ABNORMAL LOW (ref >60)
Glucose, Bld: 149 mg/dL — ABNORMAL HIGH (ref 70–99)
Potassium: 4.7 mmol/L (ref 3.5–5.1)
Sodium: 138 mmol/L (ref 135–145)
Total Bilirubin: 0.4 mg/dL (ref 0.3–1.2)
Total Protein: 6.7 g/dL (ref 6.5–8.1)

## 2019-12-02 LAB — TYPE AND SCREEN
ABO/RH(D): O POS
Antibody Screen: NEGATIVE

## 2019-12-02 LAB — LIPASE, BLOOD: Lipase: 51 U/L (ref 11–51)

## 2019-12-02 NOTE — ED Triage Notes (Addendum)
Pt to triage via w/c with no distress note, mask in place; pt reports black stool x 1 yesterday and has had black emesis x 1 this am; pt denies pain or hx of same

## 2019-12-02 NOTE — ED Notes (Signed)
Pt asking how long for wait. Pt advised we don't have any rooms yet. Pt stating he may leave and go to [facility] because it is faster. Pt advised to let this RN know if he decides to leave.

## 2019-12-02 NOTE — ED Notes (Addendum)
Pt arrives from [facility] with c/o vomiting blood. Pt reports black vomit and stools that started yesterday. [facility] called ahead to inform of pt's arrival. Pt has type and screen completed. [place] sending over blood results. Hbg 12.5

## 2019-12-02 NOTE — ED Triage Notes (Signed)
Pt comes via POV from [facility] with c/o black stools and vomiting up blood. Pt states this started yesterday morning. Pt reports several episodes of vomit this am.  Pt denies any abdominal pain.

## 2019-12-03 DIAGNOSIS — D500 Iron deficiency anemia secondary to blood loss (chronic): Secondary | ICD-10-CM | POA: Insufficient documentation

## 2019-12-03 DIAGNOSIS — K921 Melena: Secondary | ICD-10-CM | POA: Diagnosis not present

## 2019-12-03 DIAGNOSIS — D490 Neoplasm of unspecified behavior of digestive system: Secondary | ICD-10-CM | POA: Diagnosis not present

## 2019-12-03 DIAGNOSIS — K922 Gastrointestinal hemorrhage, unspecified: Secondary | ICD-10-CM | POA: Diagnosis not present

## 2019-12-03 DIAGNOSIS — K449 Diaphragmatic hernia without obstruction or gangrene: Secondary | ICD-10-CM | POA: Diagnosis not present

## 2019-12-03 DIAGNOSIS — D62 Acute posthemorrhagic anemia: Secondary | ICD-10-CM | POA: Diagnosis not present

## 2019-12-03 DIAGNOSIS — I129 Hypertensive chronic kidney disease with stage 1 through stage 4 chronic kidney disease, or unspecified chronic kidney disease: Secondary | ICD-10-CM | POA: Diagnosis not present

## 2019-12-03 DIAGNOSIS — K920 Hematemesis: Secondary | ICD-10-CM | POA: Diagnosis not present

## 2019-12-03 DIAGNOSIS — K222 Esophageal obstruction: Secondary | ICD-10-CM | POA: Diagnosis not present

## 2019-12-03 DIAGNOSIS — K3189 Other diseases of stomach and duodenum: Secondary | ICD-10-CM | POA: Insufficient documentation

## 2019-12-03 DIAGNOSIS — C8580 Other specified types of non-Hodgkin lymphoma, unspecified site: Secondary | ICD-10-CM | POA: Diagnosis not present

## 2019-12-03 DIAGNOSIS — N184 Chronic kidney disease, stage 4 (severe): Secondary | ICD-10-CM | POA: Diagnosis not present

## 2019-12-03 DIAGNOSIS — E039 Hypothyroidism, unspecified: Secondary | ICD-10-CM | POA: Diagnosis not present

## 2019-12-03 HISTORY — DX: Iron deficiency anemia secondary to blood loss (chronic): D50.0

## 2019-12-04 DIAGNOSIS — E039 Hypothyroidism, unspecified: Secondary | ICD-10-CM | POA: Diagnosis not present

## 2019-12-04 DIAGNOSIS — N184 Chronic kidney disease, stage 4 (severe): Secondary | ICD-10-CM | POA: Diagnosis not present

## 2019-12-04 DIAGNOSIS — K259 Gastric ulcer, unspecified as acute or chronic, without hemorrhage or perforation: Secondary | ICD-10-CM | POA: Diagnosis not present

## 2019-12-04 DIAGNOSIS — N400 Enlarged prostate without lower urinary tract symptoms: Secondary | ICD-10-CM | POA: Diagnosis not present

## 2019-12-04 DIAGNOSIS — K5730 Diverticulosis of large intestine without perforation or abscess without bleeding: Secondary | ICD-10-CM | POA: Diagnosis not present

## 2019-12-04 DIAGNOSIS — N200 Calculus of kidney: Secondary | ICD-10-CM | POA: Diagnosis not present

## 2019-12-04 DIAGNOSIS — I129 Hypertensive chronic kidney disease with stage 1 through stage 4 chronic kidney disease, or unspecified chronic kidney disease: Secondary | ICD-10-CM | POA: Diagnosis not present

## 2019-12-04 DIAGNOSIS — R0789 Other chest pain: Secondary | ICD-10-CM | POA: Diagnosis not present

## 2019-12-04 DIAGNOSIS — E785 Hyperlipidemia, unspecified: Secondary | ICD-10-CM | POA: Diagnosis not present

## 2019-12-04 DIAGNOSIS — D62 Acute posthemorrhagic anemia: Secondary | ICD-10-CM | POA: Diagnosis not present

## 2019-12-04 DIAGNOSIS — K3189 Other diseases of stomach and duodenum: Secondary | ICD-10-CM | POA: Diagnosis not present

## 2019-12-13 DIAGNOSIS — C8580 Other specified types of non-Hodgkin lymphoma, unspecified site: Secondary | ICD-10-CM | POA: Diagnosis not present

## 2019-12-17 ENCOUNTER — Inpatient Hospital Stay: Payer: PPO

## 2019-12-17 ENCOUNTER — Telehealth: Payer: Self-pay | Admitting: *Deleted

## 2019-12-17 ENCOUNTER — Inpatient Hospital Stay: Payer: PPO | Attending: Oncology | Admitting: Oncology

## 2019-12-17 ENCOUNTER — Other Ambulatory Visit: Payer: Self-pay

## 2019-12-17 VITALS — BP 114/57 | HR 81 | Temp 98.5°F | Resp 16 | Wt 225.7 lb

## 2019-12-17 DIAGNOSIS — Z803 Family history of malignant neoplasm of breast: Secondary | ICD-10-CM | POA: Insufficient documentation

## 2019-12-17 DIAGNOSIS — E669 Obesity, unspecified: Secondary | ICD-10-CM

## 2019-12-17 DIAGNOSIS — K449 Diaphragmatic hernia without obstruction or gangrene: Secondary | ICD-10-CM | POA: Insufficient documentation

## 2019-12-17 DIAGNOSIS — Z79899 Other long term (current) drug therapy: Secondary | ICD-10-CM | POA: Insufficient documentation

## 2019-12-17 DIAGNOSIS — E039 Hypothyroidism, unspecified: Secondary | ICD-10-CM | POA: Diagnosis not present

## 2019-12-17 DIAGNOSIS — Z836 Family history of other diseases of the respiratory system: Secondary | ICD-10-CM | POA: Diagnosis not present

## 2019-12-17 DIAGNOSIS — Z8349 Family history of other endocrine, nutritional and metabolic diseases: Secondary | ICD-10-CM | POA: Diagnosis not present

## 2019-12-17 DIAGNOSIS — N400 Enlarged prostate without lower urinary tract symptoms: Secondary | ICD-10-CM | POA: Diagnosis not present

## 2019-12-17 DIAGNOSIS — Z85820 Personal history of malignant melanoma of skin: Secondary | ICD-10-CM | POA: Insufficient documentation

## 2019-12-17 DIAGNOSIS — N184 Chronic kidney disease, stage 4 (severe): Secondary | ICD-10-CM

## 2019-12-17 DIAGNOSIS — C8590 Non-Hodgkin lymphoma, unspecified, unspecified site: Secondary | ICD-10-CM

## 2019-12-17 DIAGNOSIS — E785 Hyperlipidemia, unspecified: Secondary | ICD-10-CM | POA: Insufficient documentation

## 2019-12-17 DIAGNOSIS — Z87891 Personal history of nicotine dependence: Secondary | ICD-10-CM | POA: Diagnosis not present

## 2019-12-17 DIAGNOSIS — C884 Extranodal marginal zone B-cell lymphoma of mucosa-associated lymphoid tissue [MALT-lymphoma]: Secondary | ICD-10-CM | POA: Diagnosis not present

## 2019-12-17 DIAGNOSIS — I129 Hypertensive chronic kidney disease with stage 1 through stage 4 chronic kidney disease, or unspecified chronic kidney disease: Secondary | ICD-10-CM

## 2019-12-17 DIAGNOSIS — Z8249 Family history of ischemic heart disease and other diseases of the circulatory system: Secondary | ICD-10-CM

## 2019-12-17 DIAGNOSIS — Z7189 Other specified counseling: Secondary | ICD-10-CM

## 2019-12-17 LAB — COMPREHENSIVE METABOLIC PANEL
ALT: 25 U/L (ref 0–44)
AST: 21 U/L (ref 15–41)
Albumin: 3.9 g/dL (ref 3.5–5.0)
Alkaline Phosphatase: 78 U/L (ref 38–126)
Anion gap: 10 (ref 5–15)
BUN: 30 mg/dL — ABNORMAL HIGH (ref 8–23)
CO2: 25 mmol/L (ref 22–32)
Calcium: 8.8 mg/dL — ABNORMAL LOW (ref 8.9–10.3)
Chloride: 104 mmol/L (ref 98–111)
Creatinine, Ser: 3.06 mg/dL — ABNORMAL HIGH (ref 0.61–1.24)
GFR calc Af Amer: 22 mL/min — ABNORMAL LOW (ref >60)
GFR calc non Af Amer: 19 mL/min — ABNORMAL LOW (ref >60)
Glucose, Bld: 126 mg/dL — ABNORMAL HIGH (ref 70–99)
Potassium: 5.1 mmol/L (ref 3.5–5.1)
Sodium: 139 mmol/L (ref 135–145)
Total Bilirubin: 0.4 mg/dL (ref 0.3–1.2)
Total Protein: 7 g/dL (ref 6.5–8.1)

## 2019-12-17 LAB — CBC WITH DIFFERENTIAL/PLATELET
Abs Immature Granulocytes: 0.06 10*3/uL (ref 0.00–0.07)
Basophils Absolute: 0 10*3/uL (ref 0.0–0.1)
Basophils Relative: 1 %
Eosinophils Absolute: 0.1 10*3/uL (ref 0.0–0.5)
Eosinophils Relative: 2 %
HCT: 37 % — ABNORMAL LOW (ref 39.0–52.0)
Hemoglobin: 12 g/dL — ABNORMAL LOW (ref 13.0–17.0)
Immature Granulocytes: 1 %
Lymphocytes Relative: 18 %
Lymphs Abs: 1 10*3/uL (ref 0.7–4.0)
MCH: 28.6 pg (ref 26.0–34.0)
MCHC: 32.4 g/dL (ref 30.0–36.0)
MCV: 88.3 fL (ref 80.0–100.0)
Monocytes Absolute: 0.5 10*3/uL (ref 0.1–1.0)
Monocytes Relative: 10 %
Neutro Abs: 3.6 10*3/uL (ref 1.7–7.7)
Neutrophils Relative %: 68 %
Platelets: 247 10*3/uL (ref 150–400)
RBC: 4.19 MIL/uL — ABNORMAL LOW (ref 4.22–5.81)
RDW: 14.9 % (ref 11.5–15.5)
WBC: 5.3 10*3/uL (ref 4.0–10.5)
nRBC: 0 % (ref 0.0–0.2)

## 2019-12-17 LAB — HEPATITIS B SURFACE ANTIGEN: Hepatitis B Surface Ag: NONREACTIVE

## 2019-12-17 LAB — IRON AND TIBC
Iron: 24 ug/dL — ABNORMAL LOW (ref 45–182)
Saturation Ratios: 7 % — ABNORMAL LOW (ref 17.9–39.5)
TIBC: 343 ug/dL (ref 250–450)
UIBC: 319 ug/dL

## 2019-12-17 LAB — FERRITIN: Ferritin: 18 ng/mL — ABNORMAL LOW (ref 24–336)

## 2019-12-17 LAB — HEPATITIS C ANTIBODY: HCV Ab: NONREACTIVE

## 2019-12-17 LAB — LACTATE DEHYDROGENASE: LDH: 146 U/L (ref 98–192)

## 2019-12-17 NOTE — Telephone Encounter (Signed)
Called patient to notify him of consult appointment with Dr. Baruch Gouty on 7/12.

## 2019-12-19 ENCOUNTER — Encounter: Payer: Self-pay | Admitting: Oncology

## 2019-12-19 DIAGNOSIS — Z7189 Other specified counseling: Secondary | ICD-10-CM | POA: Insufficient documentation

## 2019-12-19 DIAGNOSIS — C884 Extranodal marginal zone B-cell lymphoma of mucosa-associated lymphoid tissue [MALT-lymphoma]: Secondary | ICD-10-CM | POA: Insufficient documentation

## 2019-12-19 NOTE — Progress Notes (Addendum)
Hematology/Oncology Consult note [facility] Telephone:([phone] [phone]  Patient Care Team: Kirk Ruths, MD as PCP - General (Internal Medicine) Guadalupe Maple, MD as PCP - Family Medicine (Family Medicine)   Name of the patient: Bill Aguilar  [id number]  24-Mar-1946    Reason for referral-new diagnosis of marginal zone lymphoma of the stomach   Referring physician-Dr. Frazier Richards   Date of visit: 12/19/19   History of presenting illness- Patient is a 74 year old male with a past medical history significant for stage IV CKD, BPH who recently had an episode of hematemesis and melena after he ate out at a restaurant.Patient went to [facility] and underwent CT chest abdomen and pelvis without contrast CT scans did not reveal any malignancy in the chest.  2.4 cm hypoattenuating renal lesion consistent with a cyst he underwent EGD which showed widely patent Schatzki's ring in the distal esophagus.  Small hiatal hernia.  Any area of abnormal mucosa with multifocal areas of ulceration with heaped up edges as well as multiple pigmented spots within the ulcers found in the anterior 1/[place]ovich of the stomach.  It appears that there is any mass-effect/extrinsic compression of the stomach versus primary gastric malignancy.  No active bleeding as of recent bleeding.  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 Ki67 5%.  H. pylori immunostain negative.  Patient has been referred to [place] for further management  Patient is currently on Protonix and he has not had any recurrent episodes of melena or hematemesis remained stable.  Denies any drenching night sweats  ECOG PS- 1  Pain scale- 0   Review of systems- Review of Systems  Constitutional: Negative for chills, fever, malaise/fatigue and weight loss.  HENT: Negative for congestion, ear discharge and nosebleeds.   Eyes: Negative for blurred vision.  Respiratory: Negative for cough,  hemoptysis, sputum production, shortness of breath and wheezing.   Cardiovascular: Negative for chest pain, palpitations, orthopnea and claudication.  Gastrointestinal: Negative for abdominal pain, blood in stool, constipation, diarrhea, heartburn, melena, nausea and vomiting.  Genitourinary: Negative for dysuria, flank pain, frequency, hematuria and urgency.  Musculoskeletal: Negative for back pain, joint pain and myalgias.  Skin: Negative for rash.  Neurological: Negative for dizziness, tingling, focal weakness, seizures, weakness and headaches.  Endo/Heme/Allergies: Does not bruise/bleed easily.  Psychiatric/Behavioral: Negative for depression and suicidal ideas. The patient does not have insomnia.     No Known Allergies  Patient Active Problem List   Diagnosis Date Noted  . Advanced care planning/counseling discussion 06/09/2017  . 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 BPH (benign prostatic hyperplasia) 06/06/2016  . Membranous glomerulonephritis 05/15/2015  . Hypothyroidism 05/15/2015  . Myasthenia gravis ([place]) 05/15/2015  . Stage 4 chronic kidney disease ([place]) 05/15/2015  . Membranous glomerulonephritis, stage 4 05/15/2015  . Hyperlipidemia   . Essential hypertension      Past Medical History:  Diagnosis Date  . Basal cell carcinoma 2020   back of neck  . Cancer ([place]) 1991   malignant melanoma  . Chronic kidney disease   . Essential hypertension   . Heart murmur   . History of diverticulitis 06/2014  . Hyperlipidemia   . Membranous glomerulonephritis   . Myasthenia gravis ([place])   . Obesity      Past Surgical History:  Procedure Laterality Date  . EYE SURGERY     cataract surgery done at [place]   . LITHOTRIPSY    .  submandibular gland removal Left     Social History   Socioeconomic History  . Marital status: Married    Spouse name: Not on file  . Number of children: Not on file  . Years of education:  Not on file  . Highest education level: High school graduate  Occupational History  . Not on file  Tobacco Use  . Smoking status: Former Smoker    Quit date: 05/14/1969    Years since quitting: 50.6  . Smokeless tobacco: Never Used  Vaping Use  . Vaping Use: Never used  Substance and Sexual Activity  . Alcohol use: No  . Drug use: No  . Sexual activity: Not on file  Other Topics Concern  . Not on file  Social History Narrative  . Not on file   Social Determinants of Health   Financial Resource Strain:   . Difficulty of Paying Living Expenses:   Food Insecurity:   . Worried About Charity fundraiser in the Last Year:   . Arboriculturist in the Last Year:   Transportation Needs:   . Film/video editor (Medical):   Marland Kitchen Lack of Transportation (Non-Medical):   Physical Activity: Insufficiently Active  . Days of Exercise per Week: 3 days  . Minutes of Exercise per Session: 30 min  Stress:   . Feeling of Stress :   Social Connections:   . Frequency of Communication with Friends and Family:   . Frequency of Social Gatherings with Friends and Family:   . Attends Religious Services:   . Active Member of Clubs or Organizations:   . Attends Archivist Meetings:   Marland Kitchen Marital Status:   Intimate Partner Violence:   . Fear of Current or Ex-Partner:   . Emotionally Abused:   Marland Kitchen Physically Abused:   . Sexually Abused:      Family History  Problem Relation Age of Onset  . Hypertension Mother   . Thyroid disease Mother   . Breast cancer Mother   . Asthma Father      Current Outpatient Medications:  .  atorvastatin (LIPITOR) 40 MG tablet, Take 1 tablet (40 mg total) by mouth daily., Disp: 90 tablet, Rfl: 1 .  benazepril (LOTENSIN) 20 MG tablet, Take 1 tablet (20 mg total) by mouth daily., Disp: 90 tablet, Rfl: 1 .  Cholecalciferol (VITAMIN D3) 25 MCG (1000 UT) CAPS, Take 1 capsule by mouth daily., Disp: , Rfl:  .  levothyroxine (SYNTHROID) 175 MCG tablet, Take 1  tablet (175 mcg total) by mouth daily before breakfast., Disp: 90 tablet, Rfl: 0 .  Omega-3 Fatty Acids (OMEGA 3 PO), Take by mouth 2 (two) times daily., Disp: , Rfl:  .  pantoprazole (PROTONIX) 40 MG tablet, Take 40 mg by mouth 2 (two) times daily., Disp: , Rfl:  .  pyridostigmine (MESTINON) 60 MG tablet, Take 60 mg by mouth 2 (two) times daily. , Disp: , Rfl:  .  Saw Palmetto, Serenoa repens, 450 MG CAPS, Take 1,350 mg by mouth daily. , Disp: , Rfl:  .  aspirin EC 81 MG tablet, Take 81 mg by mouth daily. (Patient not taking: Reported on 12/17/2019), Disp: , Rfl:  .  mycophenolate (CELLCEPT) 250 MG capsule, Take 250 mg by mouth 2 (two) times daily. (Patient not taking: Reported on 12/17/2019), Disp: , Rfl:  .  Nutritional Supplements (THERALITH XR PO), Take by mouth 2 (two) times daily. Takes 2 tablets in AM and 2 tablets in PM, Disp: ,  Rfl:  .  omeprazole (PRILOSEC OTC) 20 MG tablet, Take 1 tablet (20 mg total) by mouth daily. (Patient not taking: Reported on 12/17/2019), Disp: 90 tablet, Rfl: 1   Physical exam:  Vitals:   12/17/19 1103  BP: (!) 114/57  Pulse: 81  Resp: 16  Temp: 98.5 F (36.9 C)  TempSrc: Tympanic  SpO2: 99%  Weight: 225 lb 11.2 oz (102.4 kg)   Physical Exam Constitutional:      General: He is not in acute distress. Cardiovascular:     Rate and Rhythm: Normal rate and regular rhythm.     Heart sounds: Normal heart sounds.  Pulmonary:     Effort: Pulmonary effort is normal.     Breath sounds: Normal breath sounds.  Abdominal:     General: Bowel sounds are normal.     Palpations: Abdomen is soft.  Lymphadenopathy:     Comments: No palpable cervical, supraclavicular, axillary or inguinal adenopathy   Skin:    General: Skin is warm and dry.  Neurological:     Mental Status: He is alert and oriented to person, place, and time.        CMP Latest Ref Rng & Units 12/17/2019  Glucose 70 - 99 mg/dL 126(H)  BUN 8 - 23 mg/dL 30(H)  Creatinine 0.61 - 1.24 mg/dL  3.06(H)  Sodium 135 - 145 mmol/L 139  Potassium 3.5 - 5.1 mmol/L 5.1  Chloride 98 - 111 mmol/L 104  CO2 22 - 32 mmol/L 25  Calcium 8.9 - 10.3 mg/dL 8.8(L)  Total Protein 6.5 - 8.1 g/dL 7.0  Total Bilirubin 0.3 - 1.2 mg/dL 0.4  Alkaline Phos 38 - 126 U/L 78  AST 15 - 41 U/L 21  ALT 0 - 44 U/L 25   CBC Latest Ref Rng & Units 12/17/2019  WBC 4.0 - 10.5 K/uL 5.3  Hemoglobin 13.0 - 17.0 g/dL 12.0(L)  Hematocrit 39 - 52 % 37.0(L)  Platelets 150 - 400 K/uL 247     Assessment and plan- Patient is a 74 y.o. male with newly diagnosed MALT lymphoma of the stomach  1.  I have discussed CT chest abdomen pelvis findings as well as pathology with the patient in detail.  Based on CT scan there was no evidence of splenomegaly or lymphomatous involvement outside of the stomach although the CT scan was done without contrast.  I have discussed with radiology and it would be okay to proceed with PET CT scan at this time despite his kidney disease.  2.  PET scan will enable [place] to assess the extent of the disease.  If there is no involvement of MALT lymphoma outside of the stomach, ISRT would be a consideration  3. I will ask [facility] to add 11:18 translocation on the biopsy specimen  4. Immunostain for H pylori was negative. Will check stool h pylori antigen next week. He will need to stay off PPI for 1 week for that to avoid false negative result.  5. Will hold off on EUS at this time  6. Discussed classification of B cell lymphomas. That marginal zone lymphoma is usually a slow growing indolent variety which is likely to respond very well to H pylori treatment if he is positive for the same +/- ISRT. He is unlikely to require rituxan  7. Will check cbc with dfiff, cmp, ldh, hepatitis testing today.   He will see me and Dr. Baruch Gouty in 2 weeks   Thank you for this kind referral and the  opportunity to participate in the care of this patient   Visit Diagnosis 1. Lymphoma, unspecified body  region, unspecified lymphoma type [facility])     Dr. [name], MD, MPH [facility] at [facility] [phone] 12/19/2019 10:56 AM

## 2019-12-21 ENCOUNTER — Institutional Professional Consult (permissible substitution): Payer: PPO | Admitting: Radiation Oncology

## 2019-12-22 ENCOUNTER — Encounter: Payer: Self-pay | Admitting: Oncology

## 2019-12-23 ENCOUNTER — Encounter
Admission: RE | Admit: 2019-12-23 | Discharge: 2019-12-23 | Disposition: A | Discharge status: Home / Self Care | Payer: PPO | Source: Ambulatory Visit | Attending: Oncology | Admitting: Oncology

## 2019-12-23 ENCOUNTER — Other Ambulatory Visit: Payer: Self-pay

## 2019-12-23 DIAGNOSIS — C884 Extranodal marginal zone B-cell lymphoma of mucosa-associated lymphoid tissue [MALT-lymphoma]: Secondary | ICD-10-CM | POA: Insufficient documentation

## 2019-12-23 LAB — GLUCOSE, CAPILLARY: Glucose-Capillary: 99 mg/dL (ref 70–99)

## 2019-12-23 MED ORDER — FLUDEOXYGLUCOSE F - 18 (FDG) INJECTION
12.0100 | Freq: Once | INTRAVENOUS | Status: AC | PRN
  Administered 2019-12-23: 12.01 via INTRAVENOUS

## 2019-12-24 ENCOUNTER — Other Ambulatory Visit: Payer: Self-pay

## 2019-12-24 ENCOUNTER — Telehealth: Payer: Self-pay | Admitting: *Deleted

## 2019-12-24 DIAGNOSIS — C884 Extranodal marginal zone B-cell lymphoma of mucosa-associated lymphoid tissue [MALT-lymphoma]: Secondary | ICD-10-CM

## 2019-12-24 NOTE — Telephone Encounter (Signed)
Patient called stating he sees the PET results in My Chart and would like to speak with D Janese Banks before his scheduled appointment on 7/12, today if possible. Please return his call [phone]

## 2019-12-24 NOTE — Telephone Encounter (Signed)
done

## 2019-12-26 LAB — H. PYLORI ANTIGEN, STOOL: H. Pylori Stool Ag, Eia: NEGATIVE

## 2019-12-30 ENCOUNTER — Encounter: Payer: Self-pay | Admitting: Oncology

## 2019-12-31 ENCOUNTER — Encounter: Payer: Self-pay | Admitting: Oncology

## 2020-01-03 ENCOUNTER — Ambulatory Visit
Admission: RE | Admit: 2020-01-03 | Discharge: 2020-01-03 | Disposition: A | Discharge status: Home / Self Care | Payer: PPO | Source: Ambulatory Visit | Attending: Radiation Oncology | Admitting: Radiation Oncology

## 2020-01-03 ENCOUNTER — Other Ambulatory Visit: Payer: Self-pay

## 2020-01-03 ENCOUNTER — Inpatient Hospital Stay: Payer: PPO | Attending: Oncology | Admitting: Oncology

## 2020-01-03 ENCOUNTER — Encounter: Payer: Self-pay | Admitting: Oncology

## 2020-01-03 VITALS — BP 99/46 | HR 88 | Temp 97.4°F | Resp 16 | Wt 227.1 lb

## 2020-01-03 DIAGNOSIS — D509 Iron deficiency anemia, unspecified: Secondary | ICD-10-CM | POA: Diagnosis not present

## 2020-01-03 DIAGNOSIS — K921 Melena: Secondary | ICD-10-CM | POA: Insufficient documentation

## 2020-01-03 DIAGNOSIS — I129 Hypertensive chronic kidney disease with stage 1 through stage 4 chronic kidney disease, or unspecified chronic kidney disease: Secondary | ICD-10-CM | POA: Diagnosis not present

## 2020-01-03 DIAGNOSIS — Z8249 Family history of ischemic heart disease and other diseases of the circulatory system: Secondary | ICD-10-CM | POA: Insufficient documentation

## 2020-01-03 DIAGNOSIS — K920 Hematemesis: Secondary | ICD-10-CM | POA: Diagnosis not present

## 2020-01-03 DIAGNOSIS — Z85820 Personal history of malignant melanoma of skin: Secondary | ICD-10-CM | POA: Diagnosis not present

## 2020-01-03 DIAGNOSIS — Z836 Family history of other diseases of the respiratory system: Secondary | ICD-10-CM | POA: Diagnosis not present

## 2020-01-03 DIAGNOSIS — N058 Unspecified nephritic syndrome with other morphologic changes: Secondary | ICD-10-CM | POA: Insufficient documentation

## 2020-01-03 DIAGNOSIS — N184 Chronic kidney disease, stage 4 (severe): Secondary | ICD-10-CM | POA: Insufficient documentation

## 2020-01-03 DIAGNOSIS — Z8349 Family history of other endocrine, nutritional and metabolic diseases: Secondary | ICD-10-CM | POA: Insufficient documentation

## 2020-01-03 DIAGNOSIS — Z79899 Other long term (current) drug therapy: Secondary | ICD-10-CM | POA: Insufficient documentation

## 2020-01-03 DIAGNOSIS — Z7982 Long term (current) use of aspirin: Secondary | ICD-10-CM | POA: Insufficient documentation

## 2020-01-03 DIAGNOSIS — Z87891 Personal history of nicotine dependence: Secondary | ICD-10-CM | POA: Insufficient documentation

## 2020-01-03 DIAGNOSIS — K259 Gastric ulcer, unspecified as acute or chronic, without hemorrhage or perforation: Secondary | ICD-10-CM | POA: Insufficient documentation

## 2020-01-03 DIAGNOSIS — K5730 Diverticulosis of large intestine without perforation or abscess without bleeding: Secondary | ICD-10-CM | POA: Diagnosis not present

## 2020-01-03 DIAGNOSIS — C884 Extranodal marginal zone b-cell lymphoma of mucosa-associated lymphoid tissue (malt-lymphoma) not having achieved remission: Secondary | ICD-10-CM

## 2020-01-03 DIAGNOSIS — G7000 Myasthenia gravis without (acute) exacerbation: Secondary | ICD-10-CM | POA: Diagnosis not present

## 2020-01-03 DIAGNOSIS — Z85828 Personal history of other malignant neoplasm of skin: Secondary | ICD-10-CM | POA: Insufficient documentation

## 2020-01-03 DIAGNOSIS — Z803 Family history of malignant neoplasm of breast: Secondary | ICD-10-CM | POA: Diagnosis not present

## 2020-01-03 DIAGNOSIS — N400 Enlarged prostate without lower urinary tract symptoms: Secondary | ICD-10-CM | POA: Diagnosis not present

## 2020-01-03 NOTE — Consult Note (Signed)
NEW PATIENT EVALUATION  Name: Bill Aguilar  MRN: [id number]  Date:   01/03/2020     DOB: Sep 13, 1945   This 74 y.o. male patient presents to the clinic for initial evaluation of marginal zone lymphoma of the stomach.  REFERRING PHYSICIAN: Kirk Ruths, MD  CHIEF COMPLAINT:  Chief Complaint  Patient presents with  . 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   PREVIOUS INVESTIGATIONS:  PET CT scan reviewed Pathology report reviewed Clinical notes reviewed  HPI: Patient is a 73 year old male who presented with an episode of hematemesis and nausea.  Work-up at [facility].  CT scans did not appear to show any specific pathology he underwent upper endoscopy showing a small hiatal hernia he was found in the antrum of the stomach to have an ulcerative lesion with biopsy consistent with marginal zone lymphoma Ki-67 at 5% H. pylori immunostain negative.  Patient is currently on Protonix.  Has not had any recent hematemesis or nausea.  He feels well his weight is stable if not in creasing.  He is now referred to radiation oncology for consideration of treatment.  Patient does have a history of membranous chlamydial nephritis myasthenia gravis stage IV chronic renal disease.  His PET CT scan demonstrated focal hypermetabolic activity in the mid stomach consistent with known lymphoma.  No other evidence of lymphadenopathy or splenic or bone involvement.  PLANNED TREATMENT REGIMEN: External beam radiation therapy to his stomach  PAST MEDICAL HISTORY:  has a past medical history of Basal cell carcinoma (2020), Cancer ([place]) (1991), Chronic kidney disease, Essential hypertension, Heart murmur, History of diverticulitis (06/2014), Hyperlipidemia, Membranous glomerulonephritis, Myasthenia gravis ([place]), and Obesity.    PAST SURGICAL HISTORY:  Past Surgical History:   Procedure Laterality Date  . EYE SURGERY     cataract surgery done at [place]   . LITHOTRIPSY    . submandibular gland removal Left     FAMILY HISTORY: family history includes Asthma in his father; Breast cancer in his mother; Hypertension in his mother; Thyroid disease in his mother.  SOCIAL HISTORY:  reports that he quit smoking about 50 years ago. He has never used smokeless tobacco. He reports that he does not drink alcohol and does not use drugs.  ALLERGIES: Patient has no known allergies.  MEDICATIONS:  Current Outpatient Medications  Medication Sig Dispense Refill  . aspirin EC 81 MG tablet Take 81 mg by mouth daily. (Patient not taking: Reported on 12/17/2019)    . atorvastatin (LIPITOR) 40 MG tablet Take 1 tablet (40 mg total) by mouth daily. 90 tablet 1  . benazepril (LOTENSIN) 20 MG tablet Take 1 tablet (20 mg total) by mouth daily. 90 tablet 1  . Cholecalciferol (VITAMIN D3) 25 MCG (1000 UT) CAPS Take 1 capsule by mouth daily.    Marland Kitchen levothyroxine (SYNTHROID) 175 MCG tablet Take 1 tablet (175 mcg total) by mouth daily before breakfast. 90 tablet 0  . mycophenolate (CELLCEPT) 250 MG capsule Take 250 mg by mouth 2 (two) times daily. (Patient not taking: Reported on 12/17/2019)    . Nutritional Supplements (THERALITH XR PO) Take by mouth 2 (two) times daily. Takes 2 tablets in AM and 2 tablets in PM    . Omega-3 Fatty Acids (OMEGA 3 PO) Take by mouth 2 (two) times daily.    Marland Kitchen omeprazole (PRILOSEC OTC) 20 MG tablet Take 1 tablet (20 mg total) by  mouth daily. 90 tablet 1  . pantoprazole (PROTONIX) 40 MG tablet Take 40 mg by mouth 2 (two) times daily.    Marland Kitchen pyridostigmine (MESTINON) 60 MG tablet Take 60 mg by mouth 2 (two) times daily.     . Saw Palmetto, Serenoa repens, 450 MG CAPS Take 1,350 mg by mouth daily.      No current facility-administered medications for this encounter.    ECOG PERFORMANCE STATUS:  1 - Symptomatic but completely ambulatory  REVIEW OF SYSTEMS: Patient  denies any weight loss, fatigue, weakness, fever, chills or night sweats. Patient denies any loss of vision, blurred vision. Patient denies any ringing  of the ears or hearing loss. No irregular heartbeat. Patient denies heart murmur or history of fainting. Patient denies any chest pain or pain radiating to her upper extremities. Patient denies any shortness of breath, difficulty breathing at night, cough or hemoptysis. Patient denies any swelling in the lower legs. Patient denies any nausea vomiting, vomiting of blood, or coffee ground material in the vomitus. Patient denies any stomach pain. Patient states has had normal bowel movements no significant constipation or diarrhea. Patient denies any dysuria, hematuria or significant nocturia. Patient denies any problems walking, swelling in the joints or loss of balance. Patient denies any skin changes, loss of hair or loss of weight. Patient denies any excessive worrying or anxiety or significant depression. Patient denies any problems with insomnia. Patient denies excessive thirst, polyuria, polydipsia. Patient denies any swollen glands, patient denies easy bruising or easy bleeding. Patient denies any recent infections, allergies or URI. Patient "s visual fields have not changed significantly in recent time.   PHYSICAL EXAM: There were no vitals taken for this visit. Well-developed well-nourished patient in NAD. HEENT reveals PERLA, EOMI, discs not visualized.  Oral cavity is clear. No oral mucosal lesions are identified. Neck is clear without evidence of cervical or supraclavicular adenopathy. Lungs are clear to A&P. Cardiac examination is essentially unremarkable with regular rate and rhythm without murmur rub or thrill. Abdomen is benign with no organomegaly or masses noted. Motor sensory and DTR levels are equal and symmetric in the upper and lower extremities. Cranial nerves II through XII are grossly intact. Proprioception is intact. No peripheral  adenopathy or edema is identified. No motor or sensory levels are noted. Crude visual fields are within normal range.  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  External beam radiation therapy is shown to have excellent response in marginal zone lymphoma.  I would plan to deliver 3960 cGy in 22 fractions.  Risks and benefits of treatment including possible nausea skin reaction fatigue alteration of blood counts all were described in detail to the patient and his wife.  They both seem to comprehend my treatment plan well.  I would use a PET fusion study for treatment planning.  Patient and wife both comprehend my treatment plan well I personally set up and ordered CT simulation for later this week.  I would like to take this opportunity to thank you for allowing me to participate in the care of your patient.Noreene Filbert, MD

## 2020-01-05 ENCOUNTER — Other Ambulatory Visit: Payer: Self-pay | Admitting: *Deleted

## 2020-01-05 ENCOUNTER — Ambulatory Visit
Admission: RE | Admit: 2020-01-05 | Discharge: 2020-01-05 | Disposition: A | Discharge status: Home / Self Care | Payer: PPO | Source: Ambulatory Visit | Attending: Radiation Oncology | Admitting: Radiation Oncology

## 2020-01-05 DIAGNOSIS — C884 Extranodal marginal zone B-cell lymphoma of mucosa-associated lymphoid tissue [MALT-lymphoma]: Secondary | ICD-10-CM

## 2020-01-05 DIAGNOSIS — Z510 Encounter for antineoplastic radiation therapy: Secondary | ICD-10-CM | POA: Diagnosis not present

## 2020-01-10 DIAGNOSIS — C884 Extranodal marginal zone B-cell lymphoma of mucosa-associated lymphoid tissue [MALT-lymphoma]: Secondary | ICD-10-CM | POA: Diagnosis not present

## 2020-01-10 DIAGNOSIS — N184 Chronic kidney disease, stage 4 (severe): Secondary | ICD-10-CM | POA: Diagnosis not present

## 2020-01-10 DIAGNOSIS — N052 Unspecified nephritic syndrome with diffuse membranous glomerulonephritis: Secondary | ICD-10-CM | POA: Diagnosis not present

## 2020-01-10 DIAGNOSIS — I129 Hypertensive chronic kidney disease with stage 1 through stage 4 chronic kidney disease, or unspecified chronic kidney disease: Secondary | ICD-10-CM | POA: Diagnosis not present

## 2020-01-10 DIAGNOSIS — R809 Proteinuria, unspecified: Secondary | ICD-10-CM | POA: Diagnosis not present

## 2020-01-10 DIAGNOSIS — N2581 Secondary hyperparathyroidism of renal origin: Secondary | ICD-10-CM | POA: Diagnosis not present

## 2020-01-10 DIAGNOSIS — Z510 Encounter for antineoplastic radiation therapy: Secondary | ICD-10-CM | POA: Diagnosis not present

## 2020-01-10 NOTE — Progress Notes (Signed)
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  2.4 cm hypoattenuating renal lesion consistent with a cyst he underwent EGD which showed widely patent Schatzki's ring in the distal esophagus.  Small hiatal hernia.  Any area of abnormal mucosa with multifocal areas of ulceration with heaped up edges as well as multiple pigmented spots within the ulcers found in the anterior 1/Dasovich of the stomach.  It appears that there is any mass-effect/extrinsic compression of the stomach versus primary gastric malignancy.  No active bleeding as of recent bleeding.  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 Ki67 5%. No rearrangement of MALT 1 was observed H. pylori immunostain negative.    Repeat stool antigen H. pylori testing was also negative.  Interval history-patient has not had any recurrence of hematemesis.  Denies any abdominal pain.  He is currently on Protonix.  ECOG PS- 1 Pain scale- 0   Review of systems- Review of Systems  Constitutional: Negative for chills, fever, malaise/fatigue and weight loss.  HENT: Negative for congestion, ear discharge and nosebleeds.   Eyes: Negative for blurred vision.    Respiratory: Negative for cough, hemoptysis, sputum production, shortness of breath and wheezing.   Cardiovascular: Negative for chest pain, palpitations, orthopnea and claudication.  Gastrointestinal: Negative for abdominal pain, blood in stool, constipation, diarrhea, heartburn, melena, nausea and vomiting.  Genitourinary: Negative for dysuria, flank pain, frequency, hematuria and urgency.  Musculoskeletal: Negative for back pain, joint pain and myalgias.  Skin: Negative for rash.  Neurological: Negative for dizziness, tingling, focal weakness, seizures, weakness and headaches.  Endo/Heme/Allergies: Does not bruise/bleed easily.  Psychiatric/Behavioral: Negative for depression and suicidal ideas. The patient does not have insomnia.       No Known Allergies   Past Medical History:  Diagnosis Date  . Basal cell carcinoma 2020   back of neck  . Cancer ([place]) 1991   malignant melanoma  . Chronic kidney disease   . Essential hypertension   . Heart murmur   . History of diverticulitis 06/2014  . Hyperlipidemia   . Membranous glomerulonephritis   . Myasthenia gravis ([place])   . Obesity      Past Surgical History:  Procedure Laterality Date  . EYE SURGERY     cataract surgery done at [place]   . LITHOTRIPSY    . submandibular gland removal Left     Social History   Socioeconomic History  . Marital status: Married    Spouse name: Not on file  . Number of children: Not on file  . Years of education: Not on file  . Highest education level: High school graduate  Occupational History  . Not on file  Tobacco Use  . Smoking status: Former Smoker    Quit date:  05/14/1969    Years since quitting: 50.6  . Smokeless tobacco: Never Used  Vaping Use  . Vaping Use: Never used  Substance and Sexual Activity  . Alcohol use: No  . Drug use: No  . Sexual activity: Not on file  Other Topics Concern  . Not on file  Social History Narrative  . Not on file   Social Determinants of  Health   Financial Resource Strain:   . Difficulty of Paying Living Expenses:   Food Insecurity:   . Worried About Charity fundraiser in the Last Year:   . Arboriculturist in the Last Year:   Transportation Needs:   . Film/video editor (Medical):   Marland Kitchen Lack of Transportation (Non-Medical):   Physical Activity:   . Days of Exercise per Week:   . Minutes of Exercise per Session:   Stress:   . Feeling of Stress :   Social Connections:   . Frequency of Communication with Friends and Family:   . Frequency of Social Gatherings with Friends and Family:   . Attends Religious Services:   . Active Member of Clubs or Organizations:   . Attends Archivist Meetings:   Marland Kitchen Marital Status:   Intimate Partner Violence:   . Fear of Current or Ex-Partner:   . Emotionally Abused:   Marland Kitchen Physically Abused:   . Sexually Abused:     Family History  Problem Relation Age of Onset  . Hypertension Mother   . Thyroid disease Mother   . Breast cancer Mother   . Asthma Father      Current Outpatient Medications:  .  atorvastatin (LIPITOR) 40 MG tablet, Take 1 tablet (40 mg total) by mouth daily., Disp: 90 tablet, Rfl: 1 .  benazepril (LOTENSIN) 20 MG tablet, Take 1 tablet (20 mg total) by mouth daily., Disp: 90 tablet, Rfl: 1 .  Cholecalciferol (VITAMIN D3) 25 MCG (1000 UT) CAPS, Take 1 capsule by mouth daily., Disp: , Rfl:  .  Nutritional Supplements (THERALITH XR PO), Take by mouth 2 (two) times daily. Takes 2 tablets in AM and 2 tablets in PM, Disp: , Rfl:  .  Omega-3 Fatty Acids (OMEGA 3 PO), Take by mouth 2 (two) times daily., Disp: , Rfl:  .  omeprazole (PRILOSEC OTC) 20 MG tablet, Take 1 tablet (20 mg total) by mouth daily., Disp: 90 tablet, Rfl: 1 .  pantoprazole (PROTONIX) 40 MG tablet, Take 40 mg by mouth 2 (two) times daily., Disp: , Rfl:  .  pyridostigmine (MESTINON) 60 MG tablet, Take 60 mg by mouth 2 (two) times daily. , Disp: , Rfl:  .  Saw Palmetto, Serenoa repens, 450 MG  CAPS, Take 1,350 mg by mouth daily. , Disp: , Rfl:  .  aspirin EC 81 MG tablet, Take 81 mg by mouth daily. (Patient not taking: Reported on 12/17/2019), Disp: , Rfl:  .  levothyroxine (SYNTHROID) 175 MCG tablet, Take 1 tablet (175 mcg total) by mouth daily before breakfast., Disp: 90 tablet, Rfl: 0 .  mycophenolate (CELLCEPT) 250 MG capsule, Take 250 mg by mouth 2 (two) times daily. (Patient not taking: Reported on 12/17/2019), Disp: , Rfl:   Physical exam:  Vitals:   01/03/20 1312  BP: (!) 99/46  Pulse: 88  Resp: 16  Temp: (!) 97.4 F (36.3 C)  TempSrc: Tympanic  SpO2: 98%  Weight: 227 lb 1.6 oz (103 kg)   Physical Exam Constitutional:      General: He is  not in acute distress. Pulmonary:     Effort: Pulmonary effort is normal.  Skin:    General: Skin is warm and dry.  Neurological:     Mental Status: He is alert and oriented to person, place, and time.      CMP Latest Ref Rng & Units 12/17/2019  Glucose 70 - 99 mg/dL 126(H)  BUN 8 - 23 mg/dL 30(H)  Creatinine 0.61 - 1.24 mg/dL 3.06(H)  Sodium 135 - 145 mmol/L 139  Potassium 3.5 - 5.1 mmol/L 5.1  Chloride 98 - 111 mmol/L 104  CO2 22 - 32 mmol/L 25  Calcium 8.9 - 10.3 mg/dL 8.8(L)  Total Protein 6.5 - 8.1 g/dL 7.0  Total Bilirubin 0.3 - 1.2 mg/dL 0.4  Alkaline Phos 38 - 126 U/L 78  AST 15 - 41 U/L 21  ALT 0 - 44 U/L 25   CBC Latest Ref Rng & Units 12/17/2019  WBC 4.0 - 10.5 K/uL 5.3  Hemoglobin 13.0 - 17.0 g/dL 12.0(L)  Hematocrit 39 - 52 % 37.0(L)  Platelets 150 - 400 K/uL 247    No images are attached to the encounter.  NM PET Image Initial (PI) Skull Base To Thigh  Result Date: 12/23/2019 CLINICAL DATA:  Subsequent treatment strategy for MALT lymphoma. Extranodal marginal zone lymphoma the stomach.UGI Endoscopy with Biopsy of stomach mass on 12/03/19 at [facility] Acute upper GI Bleed with stomach ulcerations/mass. EXAM: NUCLEAR MEDICINE PET SKULL BASE TO THIGH TECHNIQUE: 12.0 mCi F-18 FDG was injected intravenously.  Full-ring PET imaging was performed from the skull base to thigh after the radiotracer. CT data was obtained and used for attenuation correction and anatomic localization. Fasting blood glucose: 99 mg/dl COMPARISON:  Noncontrast CT 02/27/19 FINDINGS: Mediastinal blood pool activity: SUV max 2.7 Liver activity: SUV max 3.0 NECK: No hypermetabolic lymph nodes in the neck. Incidental CT findings: none CHEST: No hypermetabolic mediastinal or hilar nodes. No suspicious pulmonary nodules on the CT scan. Incidental CT findings: none ABDOMEN/PELVIS: There is focal circumferential hypermetabolic activity in the mid body of the stomach with SUV max equal 7.5. No discrete lesion identified CT portion exam. Activity occurs over approximately 5 cm segment of the mid stomach No hypermetabolic gastrohepatic ligament nodes. No upper hypermetabolic nodes periportal nodes No abnormal activity liver.  Spleen is normal volume metabolic No pelvic hypermetabolic nodes Incidental CT findings: Low-density lesion liver parenchyma (17 mm lesion image 137) consistent with benign cyst. No obstructing calculus in the LEFT kidney. Diverticulosis of the sigmoid colon. SKELETON: Sclerotic lesion in the LEFT iliac bone without metabolic activity is favored a benign sclerotic bone lesion. Incidental CT findings: none IMPRESSION: 1. Focal hypermetabolic activity in the mid stomach consistent with given history stomach lymphoma. 2. No evidence lymphoma related lymphadenopathy. 3. Normal spleen and bone. Electronically Signed   By: Suzy Bouchard M.D.   On: 12/23/2019 14:26     Assessment and plan- Patient is a 74 y.o. male with MALT lymphoma of the stomach here to discuss PET CT scan results and further management  MALT lymphoma: H. pylori testing was repeated on his stool antigen off PPI as well and that was negative.  PET CT scan does not show any evidence of Distant spread or nodal involvement indicating this is stage I disease.  He was noted  to have focal hypermetabolic activity in the mid stomach.  Normal-appearing spleen and bone.  I will therefore recommend involved field radiation treatment to this area as per NCCN guidelines.  Patient will be  seeing radiation oncology for this.  I will see him back in 2.5 months time with a repeat PET CT scan prior  Iron deficiency anemia: Likely secondary to lymphoma.  Repeat levels after completing radiation treatment   Visit Diagnosis 1. MALT lymphoma ([place])   2. Iron deficiency anemia, unspecified iron deficiency anemia type      Dr. [name], MD, MPH [facility] [phone] 01/10/2020 12:33 PM

## 2020-01-12 ENCOUNTER — Ambulatory Visit: Admission: RE | Admit: 2020-01-12 | Payer: PPO | Source: Ambulatory Visit

## 2020-01-12 DIAGNOSIS — K219 Gastro-esophageal reflux disease without esophagitis: Secondary | ICD-10-CM | POA: Diagnosis not present

## 2020-01-12 DIAGNOSIS — R809 Proteinuria, unspecified: Secondary | ICD-10-CM | POA: Diagnosis not present

## 2020-01-12 DIAGNOSIS — C884 Extranodal marginal zone B-cell lymphoma of mucosa-associated lymphoid tissue [MALT-lymphoma]: Secondary | ICD-10-CM | POA: Diagnosis not present

## 2020-01-12 DIAGNOSIS — Z510 Encounter for antineoplastic radiation therapy: Secondary | ICD-10-CM | POA: Diagnosis not present

## 2020-01-12 DIAGNOSIS — N184 Chronic kidney disease, stage 4 (severe): Secondary | ICD-10-CM | POA: Diagnosis not present

## 2020-01-12 DIAGNOSIS — N2581 Secondary hyperparathyroidism of renal origin: Secondary | ICD-10-CM | POA: Diagnosis not present

## 2020-01-12 DIAGNOSIS — N052 Unspecified nephritic syndrome with diffuse membranous glomerulonephritis: Secondary | ICD-10-CM | POA: Diagnosis not present

## 2020-01-12 DIAGNOSIS — I129 Hypertensive chronic kidney disease with stage 1 through stage 4 chronic kidney disease, or unspecified chronic kidney disease: Secondary | ICD-10-CM | POA: Diagnosis not present

## 2020-01-13 ENCOUNTER — Ambulatory Visit
Admission: RE | Admit: 2020-01-13 | Discharge: 2020-01-13 | Disposition: A | Discharge status: Home / Self Care | Payer: PPO | Source: Ambulatory Visit | Attending: Radiation Oncology | Admitting: Radiation Oncology

## 2020-01-13 ENCOUNTER — Ambulatory Visit: Payer: PPO

## 2020-01-13 DIAGNOSIS — C884 Extranodal marginal zone B-cell lymphoma of mucosa-associated lymphoid tissue [MALT-lymphoma]: Secondary | ICD-10-CM | POA: Diagnosis not present

## 2020-01-13 DIAGNOSIS — Z510 Encounter for antineoplastic radiation therapy: Secondary | ICD-10-CM | POA: Diagnosis not present

## 2020-01-14 ENCOUNTER — Ambulatory Visit
Admission: RE | Admit: 2020-01-14 | Discharge: 2020-01-14 | Disposition: A | Discharge status: Home / Self Care | Payer: PPO | Source: Ambulatory Visit | Attending: Radiation Oncology | Admitting: Radiation Oncology

## 2020-01-14 ENCOUNTER — Ambulatory Visit: Payer: PPO

## 2020-01-14 DIAGNOSIS — C884 Extranodal marginal zone B-cell lymphoma of mucosa-associated lymphoid tissue [MALT-lymphoma]: Secondary | ICD-10-CM | POA: Diagnosis not present

## 2020-01-14 DIAGNOSIS — Z510 Encounter for antineoplastic radiation therapy: Secondary | ICD-10-CM | POA: Diagnosis not present

## 2020-01-17 ENCOUNTER — Ambulatory Visit: Payer: PPO

## 2020-01-17 ENCOUNTER — Ambulatory Visit
Admission: RE | Admit: 2020-01-17 | Discharge: 2020-01-17 | Disposition: A | Discharge status: Home / Self Care | Payer: PPO | Source: Ambulatory Visit | Attending: Radiation Oncology | Admitting: Radiation Oncology

## 2020-01-17 DIAGNOSIS — C884 Extranodal marginal zone B-cell lymphoma of mucosa-associated lymphoid tissue [MALT-lymphoma]: Secondary | ICD-10-CM | POA: Diagnosis not present

## 2020-01-17 DIAGNOSIS — Z510 Encounter for antineoplastic radiation therapy: Secondary | ICD-10-CM | POA: Diagnosis not present

## 2020-01-18 ENCOUNTER — Ambulatory Visit
Admission: RE | Admit: 2020-01-18 | Discharge: 2020-01-18 | Disposition: A | Discharge status: Home / Self Care | Payer: PPO | Source: Ambulatory Visit | Attending: Radiation Oncology | Admitting: Radiation Oncology

## 2020-01-18 ENCOUNTER — Ambulatory Visit: Payer: PPO

## 2020-01-18 DIAGNOSIS — C884 Extranodal marginal zone B-cell lymphoma of mucosa-associated lymphoid tissue [MALT-lymphoma]: Secondary | ICD-10-CM | POA: Diagnosis not present

## 2020-01-18 DIAGNOSIS — Z510 Encounter for antineoplastic radiation therapy: Secondary | ICD-10-CM | POA: Diagnosis not present

## 2020-01-19 ENCOUNTER — Ambulatory Visit
Admission: RE | Admit: 2020-01-19 | Discharge: 2020-01-19 | Disposition: A | Discharge status: Home / Self Care | Payer: PPO | Source: Ambulatory Visit | Attending: Radiation Oncology | Admitting: Radiation Oncology

## 2020-01-19 ENCOUNTER — Ambulatory Visit: Payer: PPO

## 2020-01-19 DIAGNOSIS — C884 Extranodal marginal zone B-cell lymphoma of mucosa-associated lymphoid tissue [MALT-lymphoma]: Secondary | ICD-10-CM | POA: Diagnosis not present

## 2020-01-19 DIAGNOSIS — Z510 Encounter for antineoplastic radiation therapy: Secondary | ICD-10-CM | POA: Diagnosis not present

## 2020-01-20 ENCOUNTER — Ambulatory Visit
Admission: RE | Admit: 2020-01-20 | Discharge: 2020-01-20 | Disposition: A | Discharge status: Home / Self Care | Payer: PPO | Source: Ambulatory Visit | Attending: Radiation Oncology | Admitting: Radiation Oncology

## 2020-01-20 ENCOUNTER — Ambulatory Visit: Payer: PPO

## 2020-01-20 DIAGNOSIS — C884 Extranodal marginal zone B-cell lymphoma of mucosa-associated lymphoid tissue [MALT-lymphoma]: Secondary | ICD-10-CM | POA: Diagnosis not present

## 2020-01-20 DIAGNOSIS — Z510 Encounter for antineoplastic radiation therapy: Secondary | ICD-10-CM | POA: Diagnosis not present

## 2020-01-21 ENCOUNTER — Ambulatory Visit
Admission: RE | Admit: 2020-01-21 | Discharge: 2020-01-21 | Disposition: A | Discharge status: Home / Self Care | Payer: PPO | Source: Ambulatory Visit | Attending: Radiation Oncology | Admitting: Radiation Oncology

## 2020-01-21 ENCOUNTER — Ambulatory Visit: Payer: PPO

## 2020-01-21 DIAGNOSIS — Z510 Encounter for antineoplastic radiation therapy: Secondary | ICD-10-CM | POA: Diagnosis not present

## 2020-01-21 DIAGNOSIS — C884 Extranodal marginal zone B-cell lymphoma of mucosa-associated lymphoid tissue [MALT-lymphoma]: Secondary | ICD-10-CM | POA: Diagnosis not present

## 2020-01-24 ENCOUNTER — Inpatient Hospital Stay: Payer: PPO | Attending: Radiation Oncology

## 2020-01-24 ENCOUNTER — Other Ambulatory Visit: Payer: Self-pay

## 2020-01-24 ENCOUNTER — Ambulatory Visit
Admission: RE | Admit: 2020-01-24 | Discharge: 2020-01-24 | Disposition: A | Discharge status: Home / Self Care | Payer: PPO | Source: Ambulatory Visit | Attending: Radiation Oncology | Admitting: Radiation Oncology

## 2020-01-24 ENCOUNTER — Ambulatory Visit: Payer: PPO

## 2020-01-24 DIAGNOSIS — C884 Extranodal marginal zone B-cell lymphoma of mucosa-associated lymphoid tissue [MALT-lymphoma]: Secondary | ICD-10-CM | POA: Diagnosis not present

## 2020-01-24 DIAGNOSIS — Z510 Encounter for antineoplastic radiation therapy: Secondary | ICD-10-CM | POA: Diagnosis not present

## 2020-01-24 LAB — CBC
HCT: 39.9 % (ref 39.0–52.0)
Hemoglobin: 12.3 g/dL — ABNORMAL LOW (ref 13.0–17.0)
MCH: 26.1 pg (ref 26.0–34.0)
MCHC: 30.8 g/dL (ref 30.0–36.0)
MCV: 84.5 fL (ref 80.0–100.0)
Platelets: 201 10*3/uL (ref 150–400)
RBC: 4.72 MIL/uL (ref 4.22–5.81)
RDW: 14.4 % (ref 11.5–15.5)
WBC: 4.8 10*3/uL (ref 4.0–10.5)
nRBC: 0 % (ref 0.0–0.2)

## 2020-01-25 ENCOUNTER — Ambulatory Visit
Admission: RE | Admit: 2020-01-25 | Discharge: 2020-01-25 | Disposition: A | Discharge status: Home / Self Care | Payer: PPO | Source: Ambulatory Visit | Attending: Radiation Oncology | Admitting: Radiation Oncology

## 2020-01-25 ENCOUNTER — Ambulatory Visit: Payer: PPO

## 2020-01-25 DIAGNOSIS — Z510 Encounter for antineoplastic radiation therapy: Secondary | ICD-10-CM | POA: Diagnosis not present

## 2020-01-25 DIAGNOSIS — C884 Extranodal marginal zone B-cell lymphoma of mucosa-associated lymphoid tissue [MALT-lymphoma]: Secondary | ICD-10-CM | POA: Diagnosis not present

## 2020-01-26 ENCOUNTER — Ambulatory Visit
Admission: RE | Admit: 2020-01-26 | Discharge: 2020-01-26 | Disposition: A | Discharge status: Home / Self Care | Payer: PPO | Source: Ambulatory Visit | Attending: Radiation Oncology | Admitting: Radiation Oncology

## 2020-01-26 ENCOUNTER — Other Ambulatory Visit: Payer: Self-pay

## 2020-01-26 ENCOUNTER — Ambulatory Visit: Payer: PPO

## 2020-01-26 DIAGNOSIS — C884 Extranodal marginal zone b-cell lymphoma of mucosa-associated lymphoid tissue (malt-lymphoma) not having achieved remission: Secondary | ICD-10-CM

## 2020-01-26 DIAGNOSIS — Z510 Encounter for antineoplastic radiation therapy: Secondary | ICD-10-CM | POA: Diagnosis not present

## 2020-01-27 ENCOUNTER — Ambulatory Visit: Payer: PPO

## 2020-01-27 ENCOUNTER — Ambulatory Visit
Admission: RE | Admit: 2020-01-27 | Discharge: 2020-01-27 | Disposition: A | Discharge status: Home / Self Care | Payer: PPO | Source: Ambulatory Visit | Attending: Radiation Oncology | Admitting: Radiation Oncology

## 2020-01-27 DIAGNOSIS — Z510 Encounter for antineoplastic radiation therapy: Secondary | ICD-10-CM | POA: Diagnosis not present

## 2020-01-27 DIAGNOSIS — C884 Extranodal marginal zone B-cell lymphoma of mucosa-associated lymphoid tissue [MALT-lymphoma]: Secondary | ICD-10-CM | POA: Diagnosis not present

## 2020-01-28 ENCOUNTER — Ambulatory Visit
Admission: RE | Admit: 2020-01-28 | Discharge: 2020-01-28 | Disposition: A | Discharge status: Home / Self Care | Payer: PPO | Source: Ambulatory Visit | Attending: Radiation Oncology | Admitting: Radiation Oncology

## 2020-01-28 ENCOUNTER — Ambulatory Visit: Payer: PPO

## 2020-01-28 DIAGNOSIS — C884 Extranodal marginal zone B-cell lymphoma of mucosa-associated lymphoid tissue [MALT-lymphoma]: Secondary | ICD-10-CM | POA: Diagnosis not present

## 2020-01-28 DIAGNOSIS — Z510 Encounter for antineoplastic radiation therapy: Secondary | ICD-10-CM | POA: Diagnosis not present

## 2020-01-31 ENCOUNTER — Ambulatory Visit: Payer: PPO

## 2020-01-31 ENCOUNTER — Ambulatory Visit
Admission: RE | Admit: 2020-01-31 | Discharge: 2020-01-31 | Disposition: A | Discharge status: Home / Self Care | Payer: PPO | Source: Ambulatory Visit | Attending: Radiation Oncology | Admitting: Radiation Oncology

## 2020-01-31 ENCOUNTER — Other Ambulatory Visit: Payer: Self-pay

## 2020-01-31 ENCOUNTER — Inpatient Hospital Stay: Payer: PPO

## 2020-01-31 DIAGNOSIS — E039 Hypothyroidism, unspecified: Secondary | ICD-10-CM | POA: Diagnosis not present

## 2020-01-31 DIAGNOSIS — Z510 Encounter for antineoplastic radiation therapy: Secondary | ICD-10-CM | POA: Diagnosis not present

## 2020-01-31 DIAGNOSIS — C884 Extranodal marginal zone b-cell lymphoma of mucosa-associated lymphoid tissue (malt-lymphoma) not having achieved remission: Secondary | ICD-10-CM

## 2020-01-31 DIAGNOSIS — N184 Chronic kidney disease, stage 4 (severe): Secondary | ICD-10-CM | POA: Diagnosis not present

## 2020-01-31 DIAGNOSIS — I129 Hypertensive chronic kidney disease with stage 1 through stage 4 chronic kidney disease, or unspecified chronic kidney disease: Secondary | ICD-10-CM | POA: Diagnosis not present

## 2020-01-31 DIAGNOSIS — G7000 Myasthenia gravis without (acute) exacerbation: Secondary | ICD-10-CM | POA: Diagnosis not present

## 2020-01-31 DIAGNOSIS — N052 Unspecified nephritic syndrome with diffuse membranous glomerulonephritis: Secondary | ICD-10-CM | POA: Diagnosis not present

## 2020-01-31 LAB — CBC
HCT: 41 % (ref 39.0–52.0)
Hemoglobin: 12.8 g/dL — ABNORMAL LOW (ref 13.0–17.0)
MCH: 26.3 pg (ref 26.0–34.0)
MCHC: 31.2 g/dL (ref 30.0–36.0)
MCV: 84.4 fL (ref 80.0–100.0)
Platelets: 207 10*3/uL (ref 150–400)
RBC: 4.86 MIL/uL (ref 4.22–5.81)
RDW: 14.9 % (ref 11.5–15.5)
WBC: 6.1 10*3/uL (ref 4.0–10.5)
nRBC: 0 % (ref 0.0–0.2)

## 2020-02-01 ENCOUNTER — Ambulatory Visit
Admission: RE | Admit: 2020-02-01 | Discharge: 2020-02-01 | Disposition: A | Discharge status: Home / Self Care | Payer: PPO | Source: Ambulatory Visit | Attending: Radiation Oncology | Admitting: Radiation Oncology

## 2020-02-01 ENCOUNTER — Ambulatory Visit: Payer: PPO

## 2020-02-01 ENCOUNTER — Other Ambulatory Visit: Payer: Self-pay | Admitting: *Deleted

## 2020-02-01 DIAGNOSIS — Z510 Encounter for antineoplastic radiation therapy: Secondary | ICD-10-CM | POA: Diagnosis not present

## 2020-02-01 DIAGNOSIS — C884 Extranodal marginal zone B-cell lymphoma of mucosa-associated lymphoid tissue [MALT-lymphoma]: Secondary | ICD-10-CM | POA: Diagnosis not present

## 2020-02-01 MED ORDER — ONDANSETRON HCL 8 MG PO TABS
8.0000 mg | ORAL_TABLET | Freq: Three times a day (TID) | ORAL | 2 refills | Status: DC | PRN
Start: 2020-02-01 — End: 2021-07-23

## 2020-02-01 MED ORDER — SUCRALFATE 1 G PO TABS: 0.5000 g | ORAL_TABLET | Freq: Three times a day (TID) | ORAL | 1 refills | Status: DC

## 2020-02-02 ENCOUNTER — Ambulatory Visit: Payer: PPO

## 2020-02-02 ENCOUNTER — Ambulatory Visit
Admission: RE | Admit: 2020-02-02 | Discharge: 2020-02-02 | Disposition: A | Discharge status: Home / Self Care | Payer: PPO | Source: Ambulatory Visit | Attending: Radiation Oncology | Admitting: Radiation Oncology

## 2020-02-02 DIAGNOSIS — C884 Extranodal marginal zone B-cell lymphoma of mucosa-associated lymphoid tissue [MALT-lymphoma]: Secondary | ICD-10-CM | POA: Diagnosis not present

## 2020-02-02 DIAGNOSIS — Z510 Encounter for antineoplastic radiation therapy: Secondary | ICD-10-CM | POA: Diagnosis not present

## 2020-02-03 ENCOUNTER — Ambulatory Visit
Admission: RE | Admit: 2020-02-03 | Discharge: 2020-02-03 | Disposition: A | Discharge status: Home / Self Care | Payer: PPO | Source: Ambulatory Visit | Attending: Radiation Oncology | Admitting: Radiation Oncology

## 2020-02-03 ENCOUNTER — Ambulatory Visit: Payer: PPO

## 2020-02-03 DIAGNOSIS — C884 Extranodal marginal zone B-cell lymphoma of mucosa-associated lymphoid tissue [MALT-lymphoma]: Secondary | ICD-10-CM | POA: Diagnosis not present

## 2020-02-03 DIAGNOSIS — Z510 Encounter for antineoplastic radiation therapy: Secondary | ICD-10-CM | POA: Diagnosis not present

## 2020-02-04 ENCOUNTER — Ambulatory Visit
Admission: RE | Admit: 2020-02-04 | Discharge: 2020-02-04 | Disposition: A | Discharge status: Home / Self Care | Payer: PPO | Source: Ambulatory Visit | Attending: Radiation Oncology | Admitting: Radiation Oncology

## 2020-02-04 ENCOUNTER — Ambulatory Visit: Payer: PPO

## 2020-02-04 DIAGNOSIS — Z510 Encounter for antineoplastic radiation therapy: Secondary | ICD-10-CM | POA: Diagnosis not present

## 2020-02-04 DIAGNOSIS — C884 Extranodal marginal zone B-cell lymphoma of mucosa-associated lymphoid tissue [MALT-lymphoma]: Secondary | ICD-10-CM | POA: Diagnosis not present

## 2020-02-04 LAB — EPSTEIN BARR VRS(EBV DNA BY PCR)
EBV DNA QN by PCR: NEGATIVE copies/mL
log10 EBV DNA Qn PCR: UNDETERMINED log10 copy/mL

## 2020-02-07 ENCOUNTER — Ambulatory Visit: Payer: PPO

## 2020-02-07 ENCOUNTER — Other Ambulatory Visit: Payer: Self-pay

## 2020-02-07 ENCOUNTER — Inpatient Hospital Stay: Payer: PPO

## 2020-02-07 ENCOUNTER — Ambulatory Visit
Admission: RE | Admit: 2020-02-07 | Discharge: 2020-02-07 | Disposition: A | Discharge status: Home / Self Care | Payer: PPO | Source: Ambulatory Visit | Attending: Radiation Oncology | Admitting: Radiation Oncology

## 2020-02-07 DIAGNOSIS — C884 Extranodal marginal zone B-cell lymphoma of mucosa-associated lymphoid tissue [MALT-lymphoma]: Secondary | ICD-10-CM | POA: Diagnosis not present

## 2020-02-07 DIAGNOSIS — Z510 Encounter for antineoplastic radiation therapy: Secondary | ICD-10-CM | POA: Diagnosis not present

## 2020-02-07 LAB — CBC
HCT: 38.8 % — ABNORMAL LOW (ref 39.0–52.0)
Hemoglobin: 12.3 g/dL — ABNORMAL LOW (ref 13.0–17.0)
MCH: 26.4 pg (ref 26.0–34.0)
MCHC: 31.7 g/dL (ref 30.0–36.0)
MCV: 83.3 fL (ref 80.0–100.0)
Platelets: 202 10*3/uL (ref 150–400)
RBC: 4.66 MIL/uL (ref 4.22–5.81)
RDW: 14.9 % (ref 11.5–15.5)
WBC: 5.6 10*3/uL (ref 4.0–10.5)
nRBC: 0 % (ref 0.0–0.2)

## 2020-02-08 ENCOUNTER — Ambulatory Visit
Admission: RE | Admit: 2020-02-08 | Discharge: 2020-02-08 | Disposition: A | Discharge status: Home / Self Care | Payer: PPO | Source: Ambulatory Visit | Attending: Radiation Oncology | Admitting: Radiation Oncology

## 2020-02-08 ENCOUNTER — Ambulatory Visit: Payer: PPO

## 2020-02-08 DIAGNOSIS — Z510 Encounter for antineoplastic radiation therapy: Secondary | ICD-10-CM | POA: Diagnosis not present

## 2020-02-08 DIAGNOSIS — C884 Extranodal marginal zone B-cell lymphoma of mucosa-associated lymphoid tissue [MALT-lymphoma]: Secondary | ICD-10-CM | POA: Diagnosis not present

## 2020-02-09 ENCOUNTER — Ambulatory Visit
Admission: RE | Admit: 2020-02-09 | Discharge: 2020-02-09 | Disposition: A | Discharge status: Home / Self Care | Payer: PPO | Source: Ambulatory Visit | Attending: Radiation Oncology | Admitting: Radiation Oncology

## 2020-02-09 ENCOUNTER — Ambulatory Visit: Payer: PPO

## 2020-02-09 DIAGNOSIS — C884 Extranodal marginal zone B-cell lymphoma of mucosa-associated lymphoid tissue [MALT-lymphoma]: Secondary | ICD-10-CM | POA: Diagnosis not present

## 2020-02-09 DIAGNOSIS — Z510 Encounter for antineoplastic radiation therapy: Secondary | ICD-10-CM | POA: Diagnosis not present

## 2020-02-10 ENCOUNTER — Ambulatory Visit
Admission: RE | Admit: 2020-02-10 | Discharge: 2020-02-10 | Disposition: A | Discharge status: Home / Self Care | Payer: PPO | Source: Ambulatory Visit | Attending: Radiation Oncology | Admitting: Radiation Oncology

## 2020-02-10 ENCOUNTER — Ambulatory Visit: Payer: PPO | Admitting: Oncology

## 2020-02-10 ENCOUNTER — Ambulatory Visit: Payer: PPO

## 2020-02-10 DIAGNOSIS — C884 Extranodal marginal zone B-cell lymphoma of mucosa-associated lymphoid tissue [MALT-lymphoma]: Secondary | ICD-10-CM | POA: Diagnosis not present

## 2020-02-10 DIAGNOSIS — Z510 Encounter for antineoplastic radiation therapy: Secondary | ICD-10-CM | POA: Diagnosis not present

## 2020-02-11 ENCOUNTER — Ambulatory Visit
Admission: RE | Admit: 2020-02-11 | Discharge: 2020-02-11 | Disposition: A | Discharge status: Home / Self Care | Payer: PPO | Source: Ambulatory Visit | Attending: Radiation Oncology | Admitting: Radiation Oncology

## 2020-02-11 ENCOUNTER — Ambulatory Visit: Payer: PPO

## 2020-02-11 DIAGNOSIS — C884 Extranodal marginal zone B-cell lymphoma of mucosa-associated lymphoid tissue [MALT-lymphoma]: Secondary | ICD-10-CM | POA: Diagnosis not present

## 2020-02-11 DIAGNOSIS — Z510 Encounter for antineoplastic radiation therapy: Secondary | ICD-10-CM | POA: Diagnosis not present

## 2020-02-14 ENCOUNTER — Ambulatory Visit: Payer: PPO

## 2020-02-14 DIAGNOSIS — K219 Gastro-esophageal reflux disease without esophagitis: Secondary | ICD-10-CM | POA: Diagnosis not present

## 2020-02-14 DIAGNOSIS — N2581 Secondary hyperparathyroidism of renal origin: Secondary | ICD-10-CM | POA: Diagnosis not present

## 2020-02-14 DIAGNOSIS — N184 Chronic kidney disease, stage 4 (severe): Secondary | ICD-10-CM | POA: Diagnosis not present

## 2020-02-14 DIAGNOSIS — N052 Unspecified nephritic syndrome with diffuse membranous glomerulonephritis: Secondary | ICD-10-CM | POA: Diagnosis not present

## 2020-02-14 DIAGNOSIS — I129 Hypertensive chronic kidney disease with stage 1 through stage 4 chronic kidney disease, or unspecified chronic kidney disease: Secondary | ICD-10-CM | POA: Diagnosis not present

## 2020-02-14 DIAGNOSIS — R809 Proteinuria, unspecified: Secondary | ICD-10-CM | POA: Diagnosis not present

## 2020-02-14 DIAGNOSIS — Z20822 Contact with and (suspected) exposure to covid-19: Secondary | ICD-10-CM | POA: Diagnosis not present

## 2020-02-15 ENCOUNTER — Ambulatory Visit: Payer: PPO

## 2020-02-16 ENCOUNTER — Ambulatory Visit: Payer: PPO

## 2020-02-17 ENCOUNTER — Ambulatory Visit: Payer: PPO

## 2020-02-17 DIAGNOSIS — R809 Proteinuria, unspecified: Secondary | ICD-10-CM | POA: Diagnosis not present

## 2020-02-17 DIAGNOSIS — N052 Unspecified nephritic syndrome with diffuse membranous glomerulonephritis: Secondary | ICD-10-CM | POA: Diagnosis not present

## 2020-02-17 DIAGNOSIS — N2581 Secondary hyperparathyroidism of renal origin: Secondary | ICD-10-CM | POA: Diagnosis not present

## 2020-02-17 DIAGNOSIS — I129 Hypertensive chronic kidney disease with stage 1 through stage 4 chronic kidney disease, or unspecified chronic kidney disease: Secondary | ICD-10-CM | POA: Diagnosis not present

## 2020-02-17 DIAGNOSIS — N184 Chronic kidney disease, stage 4 (severe): Secondary | ICD-10-CM | POA: Diagnosis not present

## 2020-02-18 ENCOUNTER — Ambulatory Visit: Payer: PPO

## 2020-02-21 ENCOUNTER — Ambulatory Visit: Payer: PPO

## 2020-02-21 DIAGNOSIS — L570 Actinic keratosis: Secondary | ICD-10-CM | POA: Diagnosis not present

## 2020-02-21 DIAGNOSIS — Z85828 Personal history of other malignant neoplasm of skin: Secondary | ICD-10-CM | POA: Diagnosis not present

## 2020-02-21 DIAGNOSIS — L309 Dermatitis, unspecified: Secondary | ICD-10-CM | POA: Diagnosis not present

## 2020-02-21 DIAGNOSIS — D225 Melanocytic nevi of trunk: Secondary | ICD-10-CM | POA: Diagnosis not present

## 2020-02-21 DIAGNOSIS — D2262 Melanocytic nevi of left upper limb, including shoulder: Secondary | ICD-10-CM | POA: Diagnosis not present

## 2020-02-21 DIAGNOSIS — Z85820 Personal history of malignant melanoma of skin: Secondary | ICD-10-CM | POA: Diagnosis not present

## 2020-02-21 DIAGNOSIS — D2271 Melanocytic nevi of right lower limb, including hip: Secondary | ICD-10-CM | POA: Diagnosis not present

## 2020-02-22 ENCOUNTER — Ambulatory Visit: Payer: PPO

## 2020-02-23 ENCOUNTER — Ambulatory Visit: Payer: PPO

## 2020-02-24 ENCOUNTER — Ambulatory Visit: Payer: PPO

## 2020-02-24 DIAGNOSIS — N401 Enlarged prostate with lower urinary tract symptoms: Secondary | ICD-10-CM | POA: Diagnosis not present

## 2020-02-24 DIAGNOSIS — R972 Elevated prostate specific antigen [PSA]: Secondary | ICD-10-CM | POA: Diagnosis not present

## 2020-02-25 ENCOUNTER — Ambulatory Visit: Payer: PPO

## 2020-02-29 ENCOUNTER — Ambulatory Visit: Payer: PPO

## 2020-03-01 ENCOUNTER — Ambulatory Visit: Payer: PPO

## 2020-03-02 ENCOUNTER — Ambulatory Visit: Payer: PPO

## 2020-03-10 DIAGNOSIS — N184 Chronic kidney disease, stage 4 (severe): Secondary | ICD-10-CM | POA: Diagnosis not present

## 2020-03-10 DIAGNOSIS — I129 Hypertensive chronic kidney disease with stage 1 through stage 4 chronic kidney disease, or unspecified chronic kidney disease: Secondary | ICD-10-CM | POA: Diagnosis not present

## 2020-03-10 DIAGNOSIS — N2581 Secondary hyperparathyroidism of renal origin: Secondary | ICD-10-CM | POA: Diagnosis not present

## 2020-03-10 DIAGNOSIS — N052 Unspecified nephritic syndrome with diffuse membranous glomerulonephritis: Secondary | ICD-10-CM | POA: Diagnosis not present

## 2020-03-10 DIAGNOSIS — R809 Proteinuria, unspecified: Secondary | ICD-10-CM | POA: Diagnosis not present

## 2020-03-16 ENCOUNTER — Ambulatory Visit: Payer: PPO | Admitting: Radiation Oncology

## 2020-03-27 ENCOUNTER — Ambulatory Visit
Admission: RE | Admit: 2020-03-27 | Discharge: 2020-03-27 | Disposition: A | Discharge status: Home / Self Care | Payer: PPO | Source: Ambulatory Visit | Attending: Radiation Oncology | Admitting: Radiation Oncology

## 2020-03-27 ENCOUNTER — Other Ambulatory Visit: Payer: Self-pay

## 2020-03-27 VITALS — BP 120/70 | HR 86 | Temp 96.8°F | Resp 16 | Wt 229.5 lb

## 2020-03-27 DIAGNOSIS — C884 Extranodal marginal zone B-cell lymphoma of mucosa-associated lymphoid tissue [MALT-lymphoma]: Secondary | ICD-10-CM | POA: Insufficient documentation

## 2020-03-27 DIAGNOSIS — N2581 Secondary hyperparathyroidism of renal origin: Secondary | ICD-10-CM | POA: Diagnosis not present

## 2020-03-27 DIAGNOSIS — R809 Proteinuria, unspecified: Secondary | ICD-10-CM | POA: Diagnosis not present

## 2020-03-27 DIAGNOSIS — N184 Chronic kidney disease, stage 4 (severe): Secondary | ICD-10-CM | POA: Diagnosis not present

## 2020-03-27 DIAGNOSIS — Z923 Personal history of irradiation: Secondary | ICD-10-CM | POA: Diagnosis not present

## 2020-03-27 DIAGNOSIS — I129 Hypertensive chronic kidney disease with stage 1 through stage 4 chronic kidney disease, or unspecified chronic kidney disease: Secondary | ICD-10-CM | POA: Diagnosis not present

## 2020-03-27 DIAGNOSIS — N052 Unspecified nephritic syndrome with diffuse membranous glomerulonephritis: Secondary | ICD-10-CM | POA: Diagnosis not present

## 2020-03-27 NOTE — Progress Notes (Signed)
Radiation Oncology Follow up Note  Name: Bill Aguilar   Date:   03/27/2020 MRN:  [id number] DOB: [birth year]    This 74 y.o. male presents to the clinic today for 1 month follow-up status post external beam radiation therapy to his stomach for a MALT lymphoma.  REFERRING PROVIDER: Kirk Ruths, MD  HPI: Patient is a 74 year old male now at 1 month having completed external beam radiation therapy for a extranodal marginal zone lymphoma of the stomach.  Seen today in follow-up he is doing well specifically denies any dyspepsia nausea.  His weight is stable and appetite is good.  He is scheduled for a follow-up.  PET CT scan next month.  COMPLICATIONS OF TREATMENT: none  FOLLOW UP COMPLIANCE: keeps appointments   PHYSICAL EXAM:  BP 120/70 (BP Location: Left Arm, Patient Position: Sitting)   Pulse 86   Temp (!) 96.8 F (36 C) (Tympanic)   Resp 16   Wt 229 lb 8 oz (104.1 kg)   BMI 32.93 kg/m  Well-developed well-nourished patient in NAD. HEENT reveals PERLA, EOMI, discs not visualized.  Oral cavity is clear. No oral mucosal lesions are identified. Neck is clear without evidence of cervical or supraclavicular adenopathy. Lungs are clear to A&P. Cardiac examination is essentially unremarkable with regular rate and rhythm without murmur rub or thrill. Abdomen is benign with no organomegaly or masses noted. Motor sensory and DTR levels are equal and symmetric in the upper and lower extremities. Cranial nerves II through XII are grossly intact. Proprioception is intact. No peripheral adenopathy or edema is identified. No motor or sensory levels are noted. Crude visual fields are within normal range.  RADIOLOGY RESULTS: PET scan will be reviewed when it is available next month  PLAN: Present time patient is doing well tolerated his treatment extremely well with very low side effect profile.  And pleased with his overall progress have asked to see him back in 3 to 4 months for  follow-up.  We will check his PET CT scan when available.  Patient knows to call with any concerns.  I would like to take this opportunity to thank you for allowing me to participate in the care of your patient.Noreene Filbert, MD

## 2020-03-29 DIAGNOSIS — I129 Hypertensive chronic kidney disease with stage 1 through stage 4 chronic kidney disease, or unspecified chronic kidney disease: Secondary | ICD-10-CM | POA: Diagnosis not present

## 2020-03-29 DIAGNOSIS — N052 Unspecified nephritic syndrome with diffuse membranous glomerulonephritis: Secondary | ICD-10-CM | POA: Diagnosis not present

## 2020-03-29 DIAGNOSIS — R809 Proteinuria, unspecified: Secondary | ICD-10-CM | POA: Diagnosis not present

## 2020-03-29 DIAGNOSIS — N2581 Secondary hyperparathyroidism of renal origin: Secondary | ICD-10-CM | POA: Diagnosis not present

## 2020-03-29 DIAGNOSIS — N184 Chronic kidney disease, stage 4 (severe): Secondary | ICD-10-CM | POA: Diagnosis not present

## 2020-04-11 ENCOUNTER — Encounter
Admission: RE | Admit: 2020-04-11 | Discharge: 2020-04-11 | Disposition: A | Discharge status: Home / Self Care | Payer: PPO | Source: Ambulatory Visit | Attending: Oncology | Admitting: Oncology

## 2020-04-11 ENCOUNTER — Other Ambulatory Visit: Payer: Self-pay

## 2020-04-11 ENCOUNTER — Inpatient Hospital Stay: Payer: PPO | Attending: Oncology

## 2020-04-11 DIAGNOSIS — Z85820 Personal history of malignant melanoma of skin: Secondary | ICD-10-CM | POA: Diagnosis not present

## 2020-04-11 DIAGNOSIS — Z803 Family history of malignant neoplasm of breast: Secondary | ICD-10-CM | POA: Insufficient documentation

## 2020-04-11 DIAGNOSIS — K760 Fatty (change of) liver, not elsewhere classified: Secondary | ICD-10-CM | POA: Insufficient documentation

## 2020-04-11 DIAGNOSIS — I700 Atherosclerosis of aorta: Secondary | ICD-10-CM | POA: Insufficient documentation

## 2020-04-11 DIAGNOSIS — Z87891 Personal history of nicotine dependence: Secondary | ICD-10-CM | POA: Insufficient documentation

## 2020-04-11 DIAGNOSIS — N184 Chronic kidney disease, stage 4 (severe): Secondary | ICD-10-CM | POA: Diagnosis not present

## 2020-04-11 DIAGNOSIS — Z8349 Family history of other endocrine, nutritional and metabolic diseases: Secondary | ICD-10-CM | POA: Diagnosis not present

## 2020-04-11 DIAGNOSIS — E785 Hyperlipidemia, unspecified: Secondary | ICD-10-CM | POA: Insufficient documentation

## 2020-04-11 DIAGNOSIS — K5730 Diverticulosis of large intestine without perforation or abscess without bleeding: Secondary | ICD-10-CM | POA: Insufficient documentation

## 2020-04-11 DIAGNOSIS — K449 Diaphragmatic hernia without obstruction or gangrene: Secondary | ICD-10-CM | POA: Diagnosis not present

## 2020-04-11 DIAGNOSIS — N400 Enlarged prostate without lower urinary tract symptoms: Secondary | ICD-10-CM | POA: Diagnosis not present

## 2020-04-11 DIAGNOSIS — R5383 Other fatigue: Secondary | ICD-10-CM | POA: Insufficient documentation

## 2020-04-11 DIAGNOSIS — Z79899 Other long term (current) drug therapy: Secondary | ICD-10-CM | POA: Diagnosis not present

## 2020-04-11 DIAGNOSIS — N200 Calculus of kidney: Secondary | ICD-10-CM | POA: Insufficient documentation

## 2020-04-11 DIAGNOSIS — C884 Extranodal marginal zone B-cell lymphoma of mucosa-associated lymphoid tissue [MALT-lymphoma]: Secondary | ICD-10-CM | POA: Insufficient documentation

## 2020-04-11 DIAGNOSIS — D509 Iron deficiency anemia, unspecified: Secondary | ICD-10-CM | POA: Insufficient documentation

## 2020-04-11 DIAGNOSIS — Z8249 Family history of ischemic heart disease and other diseases of the circulatory system: Secondary | ICD-10-CM | POA: Diagnosis not present

## 2020-04-11 DIAGNOSIS — I129 Hypertensive chronic kidney disease with stage 1 through stage 4 chronic kidney disease, or unspecified chronic kidney disease: Secondary | ICD-10-CM | POA: Diagnosis not present

## 2020-04-11 DIAGNOSIS — Z836 Family history of other diseases of the respiratory system: Secondary | ICD-10-CM | POA: Insufficient documentation

## 2020-04-11 LAB — CBC WITH DIFFERENTIAL/PLATELET
Abs Immature Granulocytes: 0.08 10*3/uL — ABNORMAL HIGH (ref 0.00–0.07)
Basophils Absolute: 0 10*3/uL (ref 0.0–0.1)
Basophils Relative: 0 %
Eosinophils Absolute: 0.2 10*3/uL (ref 0.0–0.5)
Eosinophils Relative: 4 %
HCT: 42.4 % (ref 39.0–52.0)
Hemoglobin: 13.3 g/dL (ref 13.0–17.0)
Immature Granulocytes: 2 %
Lymphocytes Relative: 12 %
Lymphs Abs: 0.6 10*3/uL — ABNORMAL LOW (ref 0.7–4.0)
MCH: 25.2 pg — ABNORMAL LOW (ref 26.0–34.0)
MCHC: 31.4 g/dL (ref 30.0–36.0)
MCV: 80.5 fL (ref 80.0–100.0)
Monocytes Absolute: 0.8 10*3/uL (ref 0.1–1.0)
Monocytes Relative: 15 %
Neutro Abs: 3.4 10*3/uL (ref 1.7–7.7)
Neutrophils Relative %: 67 %
Platelets: 190 10*3/uL (ref 150–400)
RBC: 5.27 MIL/uL (ref 4.22–5.81)
RDW: 16.7 % — ABNORMAL HIGH (ref 11.5–15.5)
WBC: 5.1 10*3/uL (ref 4.0–10.5)
nRBC: 0 % (ref 0.0–0.2)

## 2020-04-11 LAB — IRON AND TIBC
Iron: 32 ug/dL — ABNORMAL LOW (ref 45–182)
Saturation Ratios: 8 % — ABNORMAL LOW (ref 17.9–39.5)
TIBC: 381 ug/dL (ref 250–450)
UIBC: 349 ug/dL

## 2020-04-11 LAB — FERRITIN: Ferritin: 7 ng/mL — ABNORMAL LOW (ref 24–336)

## 2020-04-11 LAB — GLUCOSE, CAPILLARY: Glucose-Capillary: 116 mg/dL — ABNORMAL HIGH (ref 70–99)

## 2020-04-11 MED ORDER — FLUDEOXYGLUCOSE F - 18 (FDG) INJECTION
11.9000 | Freq: Once | INTRAVENOUS | Status: AC | PRN
  Administered 2020-04-11: 12.748 via INTRAVENOUS

## 2020-04-13 ENCOUNTER — Inpatient Hospital Stay (HOSPITAL_BASED_OUTPATIENT_CLINIC_OR_DEPARTMENT_OTHER): Payer: PPO | Admitting: Oncology

## 2020-04-13 ENCOUNTER — Other Ambulatory Visit: Payer: Self-pay

## 2020-04-13 VITALS — BP 113/67 | HR 74 | Temp 97.3°F | Resp 100 | Wt 227.0 lb

## 2020-04-13 DIAGNOSIS — Z8579 Personal history of other malignant neoplasms of lymphoid, hematopoietic and related tissues: Secondary | ICD-10-CM

## 2020-04-13 DIAGNOSIS — D509 Iron deficiency anemia, unspecified: Secondary | ICD-10-CM

## 2020-04-13 DIAGNOSIS — C884 Extranodal marginal zone B-cell lymphoma of mucosa-associated lymphoid tissue [MALT-lymphoma]: Secondary | ICD-10-CM

## 2020-04-17 NOTE — Progress Notes (Signed)
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 2.4 cm hypoattenuating renal lesion consistent with a cyst he underwent EGD which showed widely patent Schatzki's ring in the distal esophagus. Small hiatal hernia. Any area of abnormal mucosa with multifocal areas of ulceration with heaped up edges as well as multiple pigmented spots within the ulcers found in the anterior 1/Dasovich of the stomach. It appears that there is any mass-effect/extrinsic compression of the stomach versus primary gastric malignancy. No active bleeding as of recent bleeding. 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 Ki67 5%.No rearrangement of MALT 1 was observedH. pylori immunostain negative.   Repeat stool antigen H. pylori testing was also negative.he received EBRT to the involved area  Interval history- Patient currently feels well. Denies any complaints other than mild fatigue. He is worried about his CKD  ECOG PS- 1 Pain scale- 0   Review of systems- Review of Systems  Constitutional: Positive for malaise/fatigue. Negative for chills, fever and weight loss.  HENT:  Negative for congestion, ear discharge and nosebleeds.   Eyes: Negative for blurred vision.  Respiratory: Negative for cough, hemoptysis, sputum production, shortness of breath and wheezing.   Cardiovascular: Negative for chest pain, palpitations, orthopnea and claudication.  Gastrointestinal: Negative for abdominal pain, blood in stool, constipation, diarrhea, heartburn, melena, nausea and vomiting.  Genitourinary: Negative for dysuria, flank pain, frequency, hematuria and urgency.  Musculoskeletal: Negative for back pain, joint pain and myalgias.  Skin: Negative for rash.  Neurological: Negative for dizziness, tingling, focal weakness, seizures, weakness and headaches.  Endo/Heme/Allergies: Does not bruise/bleed easily.  Psychiatric/Behavioral: Negative for depression and suicidal ideas. The patient does not have insomnia.       No Known Allergies   Past Medical History:  Diagnosis Date  . Basal cell carcinoma 2020   back of neck  . Cancer ([place]) 1991   malignant melanoma  . Chronic kidney disease   . Essential hypertension   . Heart murmur   . History of diverticulitis 06/2014  . Hyperlipidemia   . Membranous glomerulonephritis   . Myasthenia gravis ([place])   . Obesity      Past Surgical History:  Procedure Laterality Date  . EYE SURGERY     cataract surgery done at [place]   . LITHOTRIPSY    . submandibular gland removal Left     Social History   Socioeconomic History  . Marital status: Married    Spouse name: Not on file  . Number of children: Not on file  . Years of education: Not on file  . Highest education level: High school graduate  Occupational History  . Not on file  Tobacco Use  . Smoking status: Former Smoker  Quit date: 05/14/1969    Years since quitting: 50.9  . Smokeless tobacco: Never Used  Vaping Use  . Vaping Use: Never used  Substance and Sexual Activity  . Alcohol use: No  . Drug use: No  . Sexual activity: Not on file  Other Topics  Concern  . Not on file  Social History Narrative  . Not on file   Social Determinants of Health   Financial Resource Strain:   . Difficulty of Paying Living Expenses: Not on file  Food Insecurity:   . Worried About Charity fundraiser in the Last Year: Not on file  . Ran Out of Food in the Last Year: Not on file  Transportation Needs:   . Lack of Transportation (Medical): Not on file  . Lack of Transportation (Non-Medical): Not on file  Physical Activity:   . Days of Exercise per Week: Not on file  . Minutes of Exercise per Session: Not on file  Stress:   . Feeling of Stress : Not on file  Social Connections:   . Frequency of Communication with Friends and Family: Not on file  . Frequency of Social Gatherings with Friends and Family: Not on file  . Attends Religious Services: Not on file  . Active Member of Clubs or Organizations: Not on file  . Attends Archivist Meetings: Not on file  . Marital Status: Not on file  Intimate Partner Violence:   . Fear of Current or Ex-Partner: Not on file  . Emotionally Abused: Not on file  . Physically Abused: Not on file  . Sexually Abused: Not on file    Family History  Problem Relation Age of Onset  . Hypertension Mother   . Thyroid disease Mother   . Breast cancer Mother   . Asthma Father      Current Outpatient Medications:  .  aspirin EC 81 MG tablet, Take 81 mg by mouth daily. , Disp: , Rfl:  .  atorvastatin (LIPITOR) 40 MG tablet, Take 1 tablet (40 mg total) by mouth daily., Disp: 90 tablet, Rfl: 1 .  benazepril (LOTENSIN) 20 MG tablet, Take 1 tablet (20 mg total) by mouth daily., Disp: 90 tablet, Rfl: 1 .  Cholecalciferol (VITAMIN D3) 25 MCG (1000 UT) CAPS, Take 1 capsule by mouth daily., Disp: , Rfl:  .  dutasteride (AVODART) 0.5 MG capsule, SMARTSIG:1 Capsule(s) By Mouth Every Evening, Disp: , Rfl:  .  gabapentin (NEURONTIN) 300 MG capsule, Take by mouth., Disp: , Rfl:  .  levothyroxine (SYNTHROID) 175 MCG  tablet, Take 1 tablet (175 mcg total) by mouth daily before breakfast., Disp: 90 tablet, Rfl: 0 .  mometasone (ELOCON) 0.1 % ointment, Apply topically 2 (two) times daily., Disp: , Rfl:  .  Omega-3 Fatty Acids (OMEGA 3 PO), Take by mouth 2 (two) times daily., Disp: , Rfl:  .  omeprazole (PRILOSEC OTC) 20 MG tablet, Take 1 tablet (20 mg total) by mouth daily., Disp: 90 tablet, Rfl: 1 .  ondansetron (ZOFRAN) 8 MG tablet, Take 1 tablet (8 mg total) by mouth every 8 (eight) hours as needed for nausea or vomiting., Disp: 60 tablet, Rfl: 2 .  pantoprazole (PROTONIX) 40 MG tablet, Take 40 mg by mouth 2 (two) times daily., Disp: , Rfl:  .  pyridostigmine (MESTINON) 60 MG tablet, Take 60 mg by mouth 2 (two) times daily. , Disp: , Rfl:  .  Saw Palmetto, Serenoa repens, 450 MG CAPS, Take 1,350 mg by mouth daily. ,  Disp: , Rfl:  .  sucralfate (CARAFATE) 1 g tablet, Take 0.5 tablets (0.5 g total) by mouth 3 (three) times daily., Disp: 45 tablet, Rfl: 1 .  tamsulosin (FLOMAX) 0.4 MG CAPS capsule, SMARTSIG:1 Capsule(s) By Mouth Every Evening, Disp: , Rfl:  .  mycophenolate (CELLCEPT) 250 MG capsule, Take 250 mg by mouth 2 (two) times daily. (Patient not taking: Reported on 12/17/2019), Disp: , Rfl:  .  Nutritional Supplements (THERALITH XR PO), Take by mouth 2 (two) times daily. Takes 2 tablets in AM and 2 tablets in PM (Patient not taking: Reported on 04/13/2020), Disp: , Rfl:   Physical exam:  Vitals:   04/13/20 1050  BP: 113/67  Pulse: 74  Resp: (!) 100  Temp: (!) 97.3 F (36.3 C)  TempSrc: Tympanic  Weight: 227 lb (103 kg)   Physical Exam Constitutional:      General: He is not in acute distress. Cardiovascular:     Rate and Rhythm: Normal rate and regular rhythm.     Heart sounds: Normal heart sounds.  Pulmonary:     Effort: Pulmonary effort is normal.     Breath sounds: Normal breath sounds.  Abdominal:     General: Bowel sounds are normal.     Palpations: Abdomen is soft.  Skin:     General: Skin is warm and dry.  Neurological:     Mental Status: He is alert and oriented to person, place, and time.      CMP Latest Ref Rng & Units 12/17/2019  Glucose 70 - 99 mg/dL 126(H)  BUN 8 - 23 mg/dL 30(H)  Creatinine 0.61 - 1.24 mg/dL 3.06(H)  Sodium 135 - 145 mmol/L 139  Potassium 3.5 - 5.1 mmol/L 5.1  Chloride 98 - 111 mmol/L 104  CO2 22 - 32 mmol/L 25  Calcium 8.9 - 10.3 mg/dL 8.8(L)  Total Protein 6.5 - 8.1 g/dL 7.0  Total Bilirubin 0.3 - 1.2 mg/dL 0.4  Alkaline Phos 38 - 126 U/L 78  AST 15 - 41 U/L 21  ALT 0 - 44 U/L 25   CBC Latest Ref Rng & Units 04/11/2020  WBC 4.0 - 10.5 K/uL 5.1  Hemoglobin 13.0 - 17.0 g/dL 13.3  Hematocrit 39 - 52 % 42.4  Platelets 150 - 400 K/uL 190    No images are attached to the encounter.  NM PET Image Restag (PS) Skull Base To Thigh  Result Date: 04/12/2020 CLINICAL DATA:  Subsequent treatment strategy for MALT lymphoma. EXAM: NUCLEAR MEDICINE PET SKULL BASE TO THIGH TECHNIQUE: 12.7 mCi F-18 FDG was injected intravenously. Full-ring PET imaging was performed from the skull base to thigh after the radiotracer. CT data was obtained and used for attenuation correction and anatomic localization. Fasting blood glucose: 116 mg/dl COMPARISON:  12/23/2019 FINDINGS: Mediastinal blood-pool activity (background): SUV max = 3.4 Liver activity (reference): SUV max = 4.0 NECK:  No hypermetabolic lymph nodes or masses. Incidental CT findings:  None. CHEST: No hypermetabolic masses or lymphadenopathy. No suspicious pulmonary nodules seen on CT images. Incidental CT findings:  None. ABDOMEN/PELVIS: Previously seen focal hypermetabolic activity in the body the stomach has resolved since prior exam. No abnormal hypermetabolic activity within the liver, pancreas, adrenal glands, or spleen. No hypermetabolic lymph nodes in the abdomen or pelvis. Incidental CT findings: Diffuse hepatic steatosis again seen with stable low-attenuation lesion in the left lobe. Left  nephrolithiasis again demonstrated, without evidence of hydronephrosis. Diverticulosis is seen mainly involving the descending and sigmoid colon, however there is no evidence  of diverticulitis. Aortic atherosclerotic calcification noted. SKELETON: No focal hypermetabolic bone lesions to suggest skeletal metastasis. Incidental CT findings:  None. IMPRESSION: Resolution of focal hypermetabolic activity in stomach since previous study. No evidence of metabolically active disease. (Deauville score 1) Electronically Signed   By: Marlaine Hind M.D.   On: 04/12/2020 09:27     Assessment and plan- Patient is a 74 y.o. male with Stage I MALT lymphoma of stomach H pylori negative. He received EBRT and this is a visit to discuss PET scan results  I have reviewed pet images independently and discussed findings with the patient. Patient has had good response to EBRT. No hypermetabolism seen. Deauville 1. He is in CR1  Per NCCN guidelines - he would repeat endoscopy now and 3-6 months after as well as 3 months following that. He wishes to undergo that here at [facility] and I will refer him to Dr. Marius Ditch.  Labs indicate iron deficiency. He is not anemic. He would like to try oral iron first  No role for imaging surveillance moving forward.   He is not on mycophenolate presently   I will see him in 4 months with cbc with diff, cmp and and LDH, ferritin and iron studies   Visit Diagnosis 1. MALT lymphoma ([place])   2. Iron deficiency anemia, unspecified iron deficiency anemia type      Dr. [name], MD, MPH [facility] [phone] 04/17/2020 12:30 PM

## 2020-04-24 ENCOUNTER — Telehealth: Payer: Self-pay | Admitting: *Deleted

## 2020-04-24 NOTE — Telephone Encounter (Signed)
Agree with what Caryl Pina said, let's wait for the COVID results  RV

## 2020-04-24 NOTE — Telephone Encounter (Signed)
Called patient and patient states he feels a lot better then he did last week. Informed patient to go for his covid test tomorrow then. Informed patient that he began to fill worse or began to run a fever to call and let us know and we will get the procedure canceled. Patient verbalized understanding and states he will go for COVID test in the morning

## 2020-04-24 NOTE — Telephone Encounter (Signed)
Patient called reporting that he has a cold and cough and that he is to have an Upper GI Thursday and he is to go get a COVID test tomorrow. He is states he is getting better, but is asking if he will be able to get the exam with a cold and if he still needs to get the COVID test tomorrow. I returned call and left message for him to contact Dr Verlin Grills office regarding this

## 2020-04-24 NOTE — Telephone Encounter (Signed)
Can you please look into this? I am ok if It is postponed. It is not urgent

## 2020-04-25 ENCOUNTER — Other Ambulatory Visit: Payer: Self-pay

## 2020-04-25 ENCOUNTER — Other Ambulatory Visit
Admission: RE | Admit: 2020-04-25 | Discharge: 2020-04-25 | Disposition: A | Discharge status: Home / Self Care | Payer: PPO | Source: Ambulatory Visit | Attending: Gastroenterology | Admitting: Gastroenterology

## 2020-04-25 DIAGNOSIS — N184 Chronic kidney disease, stage 4 (severe): Secondary | ICD-10-CM | POA: Diagnosis not present

## 2020-04-25 DIAGNOSIS — Z01812 Encounter for preprocedural laboratory examination: Secondary | ICD-10-CM | POA: Insufficient documentation

## 2020-04-25 DIAGNOSIS — Z20822 Contact with and (suspected) exposure to covid-19: Secondary | ICD-10-CM | POA: Diagnosis not present

## 2020-04-25 DIAGNOSIS — I129 Hypertensive chronic kidney disease with stage 1 through stage 4 chronic kidney disease, or unspecified chronic kidney disease: Secondary | ICD-10-CM | POA: Diagnosis not present

## 2020-04-25 LAB — SARS CORONAVIRUS 2 (TAT 6-24 HRS): SARS Coronavirus 2: NEGATIVE

## 2020-04-26 ENCOUNTER — Encounter: Payer: Self-pay | Admitting: Gastroenterology

## 2020-04-27 ENCOUNTER — Encounter: Payer: Self-pay | Admitting: Gastroenterology

## 2020-04-27 ENCOUNTER — Ambulatory Visit: Payer: PPO | Admitting: Certified Registered"

## 2020-04-27 ENCOUNTER — Ambulatory Visit
Admission: RE | Admit: 2020-04-27 | Discharge: 2020-04-27 | Disposition: A | Discharge status: Home / Self Care | Payer: PPO | Attending: Gastroenterology | Admitting: Gastroenterology

## 2020-04-27 ENCOUNTER — Encounter
Admission: RE | Disposition: A | Discharge status: Home / Self Care | Payer: Self-pay | Source: Home / Self Care | Attending: Gastroenterology

## 2020-04-27 ENCOUNTER — Other Ambulatory Visit: Payer: Self-pay

## 2020-04-27 DIAGNOSIS — Z8579 Personal history of other malignant neoplasms of lymphoid, hematopoietic and related tissues: Secondary | ICD-10-CM

## 2020-04-27 DIAGNOSIS — K2900 Acute gastritis without bleeding: Secondary | ICD-10-CM | POA: Diagnosis not present

## 2020-04-27 DIAGNOSIS — Z8572 Personal history of non-Hodgkin lymphomas: Secondary | ICD-10-CM | POA: Diagnosis not present

## 2020-04-27 DIAGNOSIS — K2950 Unspecified chronic gastritis without bleeding: Secondary | ICD-10-CM | POA: Insufficient documentation

## 2020-04-27 DIAGNOSIS — K2289 Other specified disease of esophagus: Secondary | ICD-10-CM | POA: Diagnosis not present

## 2020-04-27 DIAGNOSIS — Z08 Encounter for follow-up examination after completed treatment for malignant neoplasm: Secondary | ICD-10-CM | POA: Insufficient documentation

## 2020-04-27 DIAGNOSIS — I129 Hypertensive chronic kidney disease with stage 1 through stage 4 chronic kidney disease, or unspecified chronic kidney disease: Secondary | ICD-10-CM | POA: Diagnosis not present

## 2020-04-27 DIAGNOSIS — K2100 Gastro-esophageal reflux disease with esophagitis, without bleeding: Secondary | ICD-10-CM | POA: Diagnosis not present

## 2020-04-27 DIAGNOSIS — E785 Hyperlipidemia, unspecified: Secondary | ICD-10-CM | POA: Diagnosis not present

## 2020-04-27 DIAGNOSIS — E039 Hypothyroidism, unspecified: Secondary | ICD-10-CM | POA: Diagnosis not present

## 2020-04-27 DIAGNOSIS — N184 Chronic kidney disease, stage 4 (severe): Secondary | ICD-10-CM | POA: Diagnosis not present

## 2020-04-27 HISTORY — DX: Personal history of urinary calculi: Z87.442

## 2020-04-27 HISTORY — PX: ESOPHAGOGASTRODUODENOSCOPY (EGD) WITH PROPOFOL: SHX5813

## 2020-04-27 SURGERY — ESOPHAGOGASTRODUODENOSCOPY (EGD) WITH PROPOFOL
Anesthesia: General

## 2020-04-27 MED ORDER — PROPOFOL 10 MG/ML IV BOLUS
INTRAVENOUS | Status: DC | PRN
  Administered 2020-04-27: 10 mg via INTRAVENOUS
  Administered 2020-04-27: 60 mg via INTRAVENOUS

## 2020-04-27 MED ORDER — SODIUM CHLORIDE 0.9 % IV SOLN: INTRAVENOUS | Status: DC

## 2020-04-27 MED ORDER — GLYCOPYRROLATE 0.2 MG/ML IJ SOLN
INTRAMUSCULAR | Status: DC | PRN
  Administered 2020-04-27: .2 mg via INTRAVENOUS

## 2020-04-27 MED ORDER — LIDOCAINE HCL (CARDIAC) PF 100 MG/5ML IV SOSY
PREFILLED_SYRINGE | INTRAVENOUS | Status: DC | PRN
  Administered 2020-04-27: 100 mg via INTRAVENOUS

## 2020-04-27 MED ORDER — PROPOFOL 500 MG/50ML IV EMUL
INTRAVENOUS | Status: DC | PRN
  Administered 2020-04-27: 135 ug/kg/min via INTRAVENOUS

## 2020-04-27 NOTE — Op Note (Signed)
[facility] Patient Name: Bill Aguilar Procedure Date: 04/27/2020 8:59 AM MRN: [id number] Account #: [id number] Date of Birth: Nov 26, 1945 Admit Type: Outpatient Age: 74 Room: [facility] ENDO ROOM 4 Gender: Male Note Status: Finalized Procedure:             Upper GI endoscopy Indications:           Surveillance procedure, Lymphoma Providers:             Lin Landsman MD, MD Referring MD:          Guadalupe Maple, MD (Referring MD) Medicines:             General Anesthesia Complications:         No immediate complications. Estimated blood loss: None. Procedure:             Pre-Anesthesia Assessment:                        - Prior to the procedure, a History and Physical was                         performed, and patient medications and allergies were                         reviewed. The patient is competent. The risks and                         benefits of the procedure and the sedation options and                         risks were discussed with the patient. All questions                         were answered and informed consent was obtained.                         Patient identification and proposed procedure were                         verified by the physician, the nurse, the                         anesthesiologist, the anesthetist and the technician                         in the pre-procedure area in the procedure room in the                         endoscopy suite. Mental Status Examination: alert and                         oriented. Airway Examination: normal oropharyngeal                         airway and neck mobility. Respiratory Examination:                         clear to auscultation. CV Examination: normal.  Prophylactic Antibiotics: The patient does not require                         prophylactic antibiotics. Prior Anticoagulants: The                         patient has taken no previous anticoagulant  or                         antiplatelet agents. ASA Grade Assessment: III - A                         patient with severe systemic disease. After reviewing                         the risks and benefits, the patient was deemed in                         satisfactory condition to undergo the procedure. The                         anesthesia plan was to use general anesthesia.                         Immediately prior to administration of medications,                         the patient was re-assessed for adequacy to receive                         sedatives. The heart rate, respiratory rate, oxygen                         saturations, blood pressure, adequacy of pulmonary                         ventilation, and response to care were monitored                         throughout the procedure. The physical status of the                         patient was re-assessed after the procedure.                        After obtaining informed consent, the endoscope was                         passed under direct vision. Throughout the procedure,                         the patient's blood pressure, pulse, and oxygen                         saturations were monitored continuously. The Endoscope                         was introduced through the mouth, and advanced to the  second part of duodenum. The upper GI endoscopy was                         accomplished without difficulty. The patient tolerated                         the procedure well. Findings:      The duodenal bulb and second portion of the duodenum were normal.      The entire examined stomach was normal. Gastric mapping was perfromed,       Biopsies were taken with a cold forceps for histology.      One tongue of salmon-colored mucosa was present from 39 to 40 cm. No       other visible abnormalities were present. The maximum longitudinal       extent of these esophageal mucosal changes was 0.8 cm in length.        Biopsies were taken with a cold forceps for histology.      Esophagogastric landmarks were identified: the gastroesophageal junction       was found at 40 cm from the incisors.      The examined esophagus was normal.      The cardia and gastric fundus were normal on retroflexion. Impression:            - Normal duodenal bulb and second portion of the                         duodenum.                        - Normal stomach. Biopsied.                        - Salmon-colored mucosa suspicious for short-segment                         Barrett's esophagus. Biopsied.                        - Esophagogastric landmarks identified.                        - Normal esophagus. Recommendation:        - Await pathology results.                        - Discharge patient to home (with escort).                        - Resume previous diet today.                        - Continue present medications.                        - Repeat upper endoscopy in 3 months for surveillance. Procedure Code(s):     --- Professional ---                        [phone], Esophagogastroduodenoscopy, flexible,                         transoral; with  biopsy, single or multiple Diagnosis Code(s):     --- Professional ---                        K22.8, Other specified diseases of esophagus                        C85.90, Non-Hodgkin lymphoma, unspecified, unspecified                         site CPT copyright 2019 American Medical Association. All rights reserved. The codes documented in this report are preliminary and upon coder review may  be revised to meet current compliance requirements. Dr. Ulyess Mort Lin Landsman MD, MD 04/27/2020 9:25:28 AM This report has been signed electronically. Number of Addenda: 0 Note Initiated On: 04/27/2020 8:59 AM Estimated Blood Loss:  Estimated blood loss: none.      [facility]

## 2020-04-27 NOTE — Transfer of Care (Signed)
Immediate Anesthesia Transfer of Care Note  Patient: Bill Aguilar  Procedure(s) Performed: ESOPHAGOGASTRODUODENOSCOPY (EGD) WITH PROPOFOL (N/A )  Patient Location: Endoscopy Unit  Anesthesia Type:General  Level of Consciousness: drowsy, patient cooperative and responds to stimulation  Airway & Oxygen Therapy: Patient Spontanous Breathing and Patient connected to face mask oxygen  Post-op Assessment: Report given to RN and Post -op Vital signs reviewed and stable  Post vital signs: Reviewed and stable  Last Vitals:  Vitals Value Taken Time  BP 103/64 04/27/20 0925  Temp 36.2 C 04/27/20 0925  Pulse 86 04/27/20 0929  Resp 20 04/27/20 0929  SpO2 100 % 04/27/20 0929  Vitals shown include unvalidated device data.  Last Pain:  Vitals:   04/27/20 0925  TempSrc:   PainSc: Asleep         Complications: No complications documented.

## 2020-04-27 NOTE — Anesthesia Postprocedure Evaluation (Signed)
Anesthesia Post Note  Patient: Bill Aguilar  Procedure(s) Performed: ESOPHAGOGASTRODUODENOSCOPY (EGD) WITH PROPOFOL (N/A )  Patient location during evaluation: Endoscopy Anesthesia Type: General Level of consciousness: awake and alert and oriented Pain management: pain level controlled Vital Signs Assessment: post-procedure vital signs reviewed and stable Respiratory status: spontaneous breathing Cardiovascular status: blood pressure returned to baseline Anesthetic complications: no   No complications documented.   Last Vitals:  Vitals:   04/27/20 0945 04/27/20 0955  BP: 125/80 134/81  Pulse: 78 75  Resp: 15 15  Temp:    SpO2: 98% 98%    Last Pain:  Vitals:   04/27/20 0955  TempSrc:   PainSc: 0-No pain                 Zailee Vallely

## 2020-04-27 NOTE — H&P (Signed)
Bill Darby, MD [address]  [place]  [place], [place] [address]  Main: [phone]  Fax: [phone] Pager: [phone]  Primary Care Physician:  Kirk Ruths, MD Primary Gastroenterologist:  Dr. Cephas Aguilar  Pre-Procedure History & Physical: HPI:  Bill Aguilar is a 74 y.o. male is here for an endoscopy.   Past Medical History:  Diagnosis Date  . Basal cell carcinoma 2020   back of neck  . Cancer ([place]) 1991   malignant melanoma  . Chronic kidney disease   . Essential hypertension   . Heart murmur   . History of diverticulitis 06/2014  . History of kidney stones   . Hyperlipidemia   . Membranous glomerulonephritis   . Myasthenia gravis ([place])   . Obesity     Past Surgical History:  Procedure Laterality Date  . EYE SURGERY     cataract surgery done at [place]   . LITHOTRIPSY    . submandibular gland removal Left     Prior to Admission medications   Medication Sig Start Date End Date Taking? Authorizing Provider  aspirin EC 81 MG tablet Take 81 mg by mouth daily.    Yes [provider]  atorvastatin (LIPITOR) 40 MG tablet Take 1 tablet (40 mg total) by mouth daily. 07/27/19  Yes Johnson, Megan P, DO  benazepril (LOTENSIN) 20 MG tablet Take 1 tablet (20 mg total) by mouth daily. 07/27/19  Yes Johnson, Megan P, DO  Cholecalciferol (VITAMIN D3) 25 MCG (1000 UT) CAPS Take 1 capsule by mouth daily.   Yes [provider]  dutasteride (AVODART) 0.5 MG capsule SMARTSIG:1 Capsule(s) By Mouth Every Evening 02/18/20  Yes [provider]  gabapentin (NEURONTIN) 300 MG capsule Take by mouth.   Yes [provider]  levothyroxine (SYNTHROID) 175 MCG tablet Take 1 tablet (175 mcg total) by mouth daily before breakfast. 07/27/19  Yes Johnson, Megan P, DO  mometasone (ELOCON) 0.1 % ointment Apply topically 2 (two) times daily. 02/21/20  Yes [provider]  Omega-3 Fatty Acids (OMEGA 3 PO) Take by mouth 2 (two) times daily.    Yes [provider]  omeprazole (PRILOSEC OTC) 20 MG tablet Take 1 tablet (20 mg total) by mouth daily. 07/27/19  Yes Johnson, Megan P, DO  pantoprazole (PROTONIX) 40 MG tablet Take 40 mg by mouth 2 (two) times daily. 12/02/19  Yes [provider]  pyridostigmine (MESTINON) 60 MG tablet Take 60 mg by mouth 2 (two) times daily.    Yes [provider]  Saw [place], Serenoa repens, 450 MG CAPS Take 1,350 mg by mouth daily.    Yes [provider]  sucralfate (CARAFATE) 1 g tablet Take 0.5 tablets (0.5 g total) by mouth 3 (three) times daily. 02/01/20  Yes Chrystal, Eulas Post, MD  tamsulosin (FLOMAX) 0.4 MG CAPS capsule SMARTSIG:1 Capsule(s) By Mouth Every Evening 02/18/20  Yes [provider]  Nutritional Supplements (THERALITH XR PO) Take by mouth 2 (two) times daily. Takes 2 tablets in AM and 2 tablets in PM Patient not taking: Reported on 04/13/2020    [provider]  ondansetron (ZOFRAN) 8 MG tablet Take 1 tablet (8 mg total) by mouth every 8 (eight) hours as needed for nausea or vomiting. 02/01/20   Noreene Filbert, MD    Allergies as of 04/13/2020  . (No Known Allergies)    Family History  Problem Relation Age of Onset  . Hypertension Mother   . Thyroid disease Mother   .  Breast cancer Mother   . Asthma Father     Social History   Socioeconomic History  . Marital status: Married    Spouse name: Not on file  . Number of children: Not on file  . Years of education: Not on file  . Highest education level: High school graduate  Occupational History  . Not on file  Tobacco Use  . Smoking status: Former Smoker    Quit date: 05/14/1969    Years since quitting: 50.9  . Smokeless tobacco: Never Used  Vaping Use  . Vaping Use: Never used  Substance and Sexual Activity  . Alcohol use: No  . Drug use: No  . Sexual activity: Not on file  Other Topics Concern  . Not on file  Social History Narrative  . Not on file   Social  Determinants of Health   Financial Resource Strain:   . Difficulty of Paying Living Expenses: Not on file  Food Insecurity:   . Worried About Charity fundraiser in the Last Year: Not on file  . Ran Out of Food in the Last Year: Not on file  Transportation Needs:   . Lack of Transportation (Medical): Not on file  . Lack of Transportation (Non-Medical): Not on file  Physical Activity:   . Days of Exercise per Week: Not on file  . Minutes of Exercise per Session: Not on file  Stress:   . Feeling of Stress : Not on file  Social Connections:   . Frequency of Communication with Friends and Family: Not on file  . Frequency of Social Gatherings with Friends and Family: Not on file  . Attends Religious Services: Not on file  . Active Member of Clubs or Organizations: Not on file  . Attends Archivist Meetings: Not on file  . Marital Status: Not on file  Intimate Partner Violence:   . Fear of Current or Ex-Partner: Not on file  . Emotionally Abused: Not on file  . Physically Abused: Not on file  . Sexually Abused: Not on file    Review of Systems: See HPI, otherwise negative ROS  Physical Exam: BP 138/73   Pulse 77   Temp (!) 97.4 F (36.3 C) (Temporal)   Resp 18   Ht 5\' 10"  (1.778 m)   Wt 103 kg   SpO2 99%   BMI 32.58 kg/m  General:   Alert,  pleasant and cooperative in NAD Head:  Normocephalic and atraumatic. Neck:  Supple; no masses or thyromegaly. Lungs:  Clear throughout to auscultation.    Heart:  Regular rate and rhythm. Abdomen:  Soft, nontender and nondistended. Normal bowel sounds, without guarding, and without rebound.   Neurologic:  Alert and  oriented x4;  grossly normal neurologically.  Impression/Plan: Bill Aguilar is here for an endoscopy to be performed for h/o MALT lymphoma  Risks, benefits, limitations, and alternatives regarding  endoscopy have been reviewed with the patient.  Questions have been answered.  All parties  agreeable.   Sherri Sear, MD  04/27/2020, 8:18 AM

## 2020-04-27 NOTE — Anesthesia Preprocedure Evaluation (Signed)
Anesthesia Evaluation  Patient identified by MRN, date of birth, ID band Patient awake    Reviewed: Allergy & Precautions, NPO status , Patient's Chart, lab work & pertinent test results  Airway Mallampati: III       Dental   Pulmonary former smoker,    Pulmonary exam normal        Cardiovascular hypertension, Normal cardiovascular exam+ Valvular Problems/Murmurs      Neuro/Psych  Neuromuscular disease negative psych ROS   GI/Hepatic Neg liver ROS,   Endo/Other  Hypothyroidism   Renal/GU Renal InsufficiencyRenal disease  negative genitourinary   Musculoskeletal   Abdominal Normal abdominal exam  (+)   Peds negative pediatric ROS (+)  Hematology  (+) anemia ,   Anesthesia Other Findings Past Medical History: 2020: Basal cell carcinoma     Comment:  back of neck 1991: Cancer ([place])     Comment:  malignant melanoma No date: Chronic kidney disease No date: Essential hypertension No date: Heart murmur 06/2014: History of diverticulitis No date: History of kidney stones No date: Hyperlipidemia No date: Membranous glomerulonephritis No date: Myasthenia gravis ([place]) No date: Obesity  Reproductive/Obstetrics                             Anesthesia Physical Anesthesia Plan  ASA: III  Anesthesia Plan: General   Post-op Pain Management:    Induction: Intravenous  PONV Risk Score and Plan: Propofol infusion  Airway Management Planned: Nasal Cannula  Additional Equipment:   Intra-op Plan:   Post-operative Plan:   Informed Consent: I have reviewed the patients History and Physical, chart, labs and discussed the procedure including the risks, benefits and alternatives for the proposed anesthesia with the patient or authorized representative who has indicated his/her understanding and acceptance.     Dental advisory given  Plan Discussed with: CRNA and Surgeon  Anesthesia Plan  Comments:         Anesthesia Quick Evaluation

## 2020-04-27 NOTE — Anesthesia Procedure Notes (Signed)
Procedure Name: General with mask airway Performed by: Fletcher-Harrison, Calistro Rauf, CRNA Pre-anesthesia Checklist: Patient identified, Emergency Drugs available, Suction available and Patient being monitored Patient Re-evaluated:Patient Re-evaluated prior to induction Oxygen Delivery Method: Simple face mask Induction Type: IV induction Placement Confirmation: positive ETCO2 and CO2 detector Dental Injury: Teeth and Oropharynx as per pre-operative assessment        

## 2020-04-28 ENCOUNTER — Encounter: Payer: Self-pay | Admitting: Gastroenterology

## 2020-04-28 DIAGNOSIS — R809 Proteinuria, unspecified: Secondary | ICD-10-CM | POA: Diagnosis not present

## 2020-04-28 DIAGNOSIS — N2581 Secondary hyperparathyroidism of renal origin: Secondary | ICD-10-CM | POA: Diagnosis not present

## 2020-04-28 DIAGNOSIS — I129 Hypertensive chronic kidney disease with stage 1 through stage 4 chronic kidney disease, or unspecified chronic kidney disease: Secondary | ICD-10-CM | POA: Diagnosis not present

## 2020-04-28 DIAGNOSIS — N052 Unspecified nephritic syndrome with diffuse membranous glomerulonephritis: Secondary | ICD-10-CM | POA: Diagnosis not present

## 2020-04-28 DIAGNOSIS — N184 Chronic kidney disease, stage 4 (severe): Secondary | ICD-10-CM | POA: Diagnosis not present

## 2020-05-01 ENCOUNTER — Telehealth: Payer: Self-pay | Admitting: *Deleted

## 2020-05-01 NOTE — Telephone Encounter (Signed)
Patient informed that he should hear from Dr Verlin Grills office regarding results

## 2020-05-01 NOTE — Telephone Encounter (Signed)
Hi Rohini, Patient wants to understand his egd results. Can you please call him?Thanks, Astrid Divine

## 2020-05-01 NOTE — Telephone Encounter (Signed)
Patient called asking for help interpreting his endoscopy report. His next follow up appointment is in Feb 2022

## 2020-05-02 LAB — SURGICAL PATHOLOGY

## 2020-05-02 NOTE — Telephone Encounter (Signed)
Dr. Marius Ditch called patient this morning and left a message for call back. I called patient this afternoon and left a message for call back

## 2020-05-03 DIAGNOSIS — D631 Anemia in chronic kidney disease: Secondary | ICD-10-CM | POA: Insufficient documentation

## 2020-05-03 DIAGNOSIS — N052 Unspecified nephritic syndrome with diffuse membranous glomerulonephritis: Secondary | ICD-10-CM | POA: Diagnosis not present

## 2020-05-03 DIAGNOSIS — N189 Chronic kidney disease, unspecified: Secondary | ICD-10-CM | POA: Insufficient documentation

## 2020-05-03 DIAGNOSIS — I129 Hypertensive chronic kidney disease with stage 1 through stage 4 chronic kidney disease, or unspecified chronic kidney disease: Secondary | ICD-10-CM | POA: Diagnosis not present

## 2020-05-03 DIAGNOSIS — N184 Chronic kidney disease, stage 4 (severe): Secondary | ICD-10-CM | POA: Diagnosis not present

## 2020-05-03 DIAGNOSIS — N2581 Secondary hyperparathyroidism of renal origin: Secondary | ICD-10-CM | POA: Diagnosis not present

## 2020-05-03 DIAGNOSIS — R809 Proteinuria, unspecified: Secondary | ICD-10-CM | POA: Diagnosis not present

## 2020-05-10 DIAGNOSIS — N184 Chronic kidney disease, stage 4 (severe): Secondary | ICD-10-CM | POA: Diagnosis not present

## 2020-05-10 DIAGNOSIS — I129 Hypertensive chronic kidney disease with stage 1 through stage 4 chronic kidney disease, or unspecified chronic kidney disease: Secondary | ICD-10-CM | POA: Diagnosis not present

## 2020-05-22 ENCOUNTER — Telehealth: Payer: Self-pay | Admitting: *Deleted

## 2020-05-22 NOTE — Telephone Encounter (Signed)
Call returned to patient and advised to continue Iron tabs until he sees Dr Janese Banks in Feb. He repeated this back to me and she that is what he needed to know

## 2020-05-22 NOTE — Telephone Encounter (Signed)
Until he sees me in [place]

## 2020-05-22 NOTE — Telephone Encounter (Signed)
Patient called asking how long he is to take the iron tablets. Please advise

## 2020-05-25 DIAGNOSIS — R809 Proteinuria, unspecified: Secondary | ICD-10-CM | POA: Diagnosis not present

## 2020-05-25 DIAGNOSIS — N2581 Secondary hyperparathyroidism of renal origin: Secondary | ICD-10-CM | POA: Diagnosis not present

## 2020-05-25 DIAGNOSIS — N184 Chronic kidney disease, stage 4 (severe): Secondary | ICD-10-CM | POA: Diagnosis not present

## 2020-05-25 DIAGNOSIS — N052 Unspecified nephritic syndrome with diffuse membranous glomerulonephritis: Secondary | ICD-10-CM | POA: Diagnosis not present

## 2020-05-25 DIAGNOSIS — D631 Anemia in chronic kidney disease: Secondary | ICD-10-CM | POA: Diagnosis not present

## 2020-05-25 DIAGNOSIS — I129 Hypertensive chronic kidney disease with stage 1 through stage 4 chronic kidney disease, or unspecified chronic kidney disease: Secondary | ICD-10-CM | POA: Diagnosis not present

## 2020-05-30 DIAGNOSIS — D631 Anemia in chronic kidney disease: Secondary | ICD-10-CM | POA: Diagnosis not present

## 2020-05-30 DIAGNOSIS — I129 Hypertensive chronic kidney disease with stage 1 through stage 4 chronic kidney disease, or unspecified chronic kidney disease: Secondary | ICD-10-CM | POA: Diagnosis not present

## 2020-05-30 DIAGNOSIS — R809 Proteinuria, unspecified: Secondary | ICD-10-CM | POA: Diagnosis not present

## 2020-05-30 DIAGNOSIS — N052 Unspecified nephritic syndrome with diffuse membranous glomerulonephritis: Secondary | ICD-10-CM | POA: Diagnosis not present

## 2020-05-30 DIAGNOSIS — N2581 Secondary hyperparathyroidism of renal origin: Secondary | ICD-10-CM | POA: Diagnosis not present

## 2020-05-30 DIAGNOSIS — N184 Chronic kidney disease, stage 4 (severe): Secondary | ICD-10-CM | POA: Diagnosis not present

## 2020-07-04 DIAGNOSIS — L298 Other pruritus: Secondary | ICD-10-CM | POA: Diagnosis not present

## 2020-07-05 DIAGNOSIS — N052 Unspecified nephritic syndrome with diffuse membranous glomerulonephritis: Secondary | ICD-10-CM | POA: Diagnosis not present

## 2020-07-05 DIAGNOSIS — R809 Proteinuria, unspecified: Secondary | ICD-10-CM | POA: Diagnosis not present

## 2020-07-05 DIAGNOSIS — N2581 Secondary hyperparathyroidism of renal origin: Secondary | ICD-10-CM | POA: Diagnosis not present

## 2020-07-05 DIAGNOSIS — N184 Chronic kidney disease, stage 4 (severe): Secondary | ICD-10-CM | POA: Diagnosis not present

## 2020-07-05 DIAGNOSIS — I129 Hypertensive chronic kidney disease with stage 1 through stage 4 chronic kidney disease, or unspecified chronic kidney disease: Secondary | ICD-10-CM | POA: Diagnosis not present

## 2020-07-05 DIAGNOSIS — D631 Anemia in chronic kidney disease: Secondary | ICD-10-CM | POA: Diagnosis not present

## 2020-07-12 DIAGNOSIS — N184 Chronic kidney disease, stage 4 (severe): Secondary | ICD-10-CM | POA: Diagnosis not present

## 2020-07-12 DIAGNOSIS — R809 Proteinuria, unspecified: Secondary | ICD-10-CM | POA: Diagnosis not present

## 2020-07-12 DIAGNOSIS — N2581 Secondary hyperparathyroidism of renal origin: Secondary | ICD-10-CM | POA: Diagnosis not present

## 2020-07-12 DIAGNOSIS — I129 Hypertensive chronic kidney disease with stage 1 through stage 4 chronic kidney disease, or unspecified chronic kidney disease: Secondary | ICD-10-CM | POA: Diagnosis not present

## 2020-07-12 DIAGNOSIS — N052 Unspecified nephritic syndrome with diffuse membranous glomerulonephritis: Secondary | ICD-10-CM | POA: Diagnosis not present

## 2020-07-12 DIAGNOSIS — D631 Anemia in chronic kidney disease: Secondary | ICD-10-CM | POA: Diagnosis not present

## 2020-07-31 DIAGNOSIS — N184 Chronic kidney disease, stage 4 (severe): Secondary | ICD-10-CM | POA: Diagnosis not present

## 2020-07-31 DIAGNOSIS — I129 Hypertensive chronic kidney disease with stage 1 through stage 4 chronic kidney disease, or unspecified chronic kidney disease: Secondary | ICD-10-CM | POA: Diagnosis not present

## 2020-07-31 DIAGNOSIS — E039 Hypothyroidism, unspecified: Secondary | ICD-10-CM | POA: Diagnosis not present

## 2020-08-01 ENCOUNTER — Encounter: Payer: Self-pay | Admitting: Radiation Oncology

## 2020-08-01 DIAGNOSIS — N19 Unspecified kidney failure: Secondary | ICD-10-CM | POA: Insufficient documentation

## 2020-08-01 DIAGNOSIS — IMO0002 Reserved for concepts with insufficient information to code with codable children: Secondary | ICD-10-CM | POA: Insufficient documentation

## 2020-08-02 ENCOUNTER — Ambulatory Visit
Admission: RE | Admit: 2020-08-02 | Discharge: 2020-08-02 | Disposition: A | Discharge status: Home / Self Care | Payer: PPO | Source: Ambulatory Visit | Attending: Radiation Oncology | Admitting: Radiation Oncology

## 2020-08-02 ENCOUNTER — Other Ambulatory Visit: Payer: Self-pay

## 2020-08-02 VITALS — BP 136/73 | HR 73 | Temp 95.4°F | Wt 230.0 lb

## 2020-08-02 DIAGNOSIS — C884 Extranodal marginal zone b-cell lymphoma of mucosa-associated lymphoid tissue (malt-lymphoma) not having achieved remission: Secondary | ICD-10-CM

## 2020-08-02 DIAGNOSIS — Z08 Encounter for follow-up examination after completed treatment for malignant neoplasm: Secondary | ICD-10-CM | POA: Diagnosis not present

## 2020-08-02 NOTE — Progress Notes (Signed)
Radiation Oncology Follow up Note  Name: Bill Aguilar   Date:   08/02/2020 MRN:  [id number] DOB: 05/21/1946    This 75 y.o. male presents to the clinic today for 40-month follow-upStatus post external beam radiation therapy. 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 Seen today in routine follow-up he is doing well specifically denies any stomach upset dyspepsia or fatigue. He also specifically denies fever chills night sweats. He continues to put on weight is starting to start to diet. Patient had a. PET scan back in October which I have reviewed shows resolution of focal hypermetabolic [place] in the stomach consistent with complete response. Patient also had an upper endoscopy by Dr. Marius Ditch back in November showing no evidence of disease  COMPLICATIONS OF TREATMENT: none  FOLLOW UP COMPLIANCE: keeps appointments   PHYSICAL EXAM:  BP 136/73   Pulse 73   Temp (!) 95.4 F (35.2 C) (Tympanic)   Wt 230 lb (104.3 kg)   BMI 33.00 kg/m  Well-developed well-nourished patient in NAD. HEENT reveals PERLA, EOMI, discs not visualized.  Oral cavity is clear. No oral mucosal lesions are identified. Neck is clear without evidence of cervical or supraclavicular adenopathy. Lungs are clear to A&P. Cardiac examination is essentially unremarkable with regular rate and rhythm without murmur rub or thrill. Abdomen is benign with no organomegaly or masses noted. Motor sensory and DTR levels are equal and symmetric in the upper and lower extremities. Cranial nerves II through XII are grossly intact. Proprioception is intact. No peripheral adenopathy or edema is identified. No motor or sensory levels are noted. Crude visual fields are within normal range.  RADIOLOGY RESULTS: PET CT scan reviewed  compatible with above-stated findings  PLAN: Present time patient is doing well with no evidence of disease by PET/CT criteria as well as upper endoscopy and pleased with her overall progress. Of asked to see him back in 6 months for follow-up. He continues close follow-up care with medical oncology  I would like to take this opportunity to thank you for allowing me to participate in the care of your patient.Noreene Filbert, MD

## 2020-08-02 NOTE — Progress Notes (Signed)
Survivorship Care Plan visit completed.  Treatment summary reviewed and given to patient.  ASCO answers booklet reviewed and given to patient.  CARE program and Cancer Transitions discussed with patient along with other resources cancer center offers to patients and caregivers.  Patient verbalized understanding.    

## 2020-08-07 DIAGNOSIS — N184 Chronic kidney disease, stage 4 (severe): Secondary | ICD-10-CM | POA: Diagnosis not present

## 2020-08-07 DIAGNOSIS — E7800 Pure hypercholesterolemia, unspecified: Secondary | ICD-10-CM | POA: Diagnosis not present

## 2020-08-07 DIAGNOSIS — I129 Hypertensive chronic kidney disease with stage 1 through stage 4 chronic kidney disease, or unspecified chronic kidney disease: Secondary | ICD-10-CM | POA: Diagnosis not present

## 2020-08-07 DIAGNOSIS — Z0000 Encounter for general adult medical examination without abnormal findings: Secondary | ICD-10-CM | POA: Diagnosis not present

## 2020-08-07 DIAGNOSIS — I700 Atherosclerosis of aorta: Secondary | ICD-10-CM | POA: Diagnosis not present

## 2020-08-07 DIAGNOSIS — C884 Extranodal marginal zone B-cell lymphoma of mucosa-associated lymphoid tissue [MALT-lymphoma]: Secondary | ICD-10-CM | POA: Diagnosis not present

## 2020-08-07 DIAGNOSIS — E039 Hypothyroidism, unspecified: Secondary | ICD-10-CM | POA: Diagnosis not present

## 2020-08-07 DIAGNOSIS — G7000 Myasthenia gravis without (acute) exacerbation: Secondary | ICD-10-CM | POA: Diagnosis not present

## 2020-08-17 ENCOUNTER — Inpatient Hospital Stay (HOSPITAL_BASED_OUTPATIENT_CLINIC_OR_DEPARTMENT_OTHER): Payer: PPO | Admitting: Oncology

## 2020-08-17 ENCOUNTER — Inpatient Hospital Stay: Payer: PPO | Attending: Oncology

## 2020-08-17 ENCOUNTER — Encounter: Payer: Self-pay | Admitting: Oncology

## 2020-08-17 VITALS — BP 122/70 | HR 75 | Temp 97.8°F | Resp 16 | Ht 70.0 in | Wt 225.0 lb

## 2020-08-17 DIAGNOSIS — R5383 Other fatigue: Secondary | ICD-10-CM | POA: Insufficient documentation

## 2020-08-17 DIAGNOSIS — Z8349 Family history of other endocrine, nutritional and metabolic diseases: Secondary | ICD-10-CM | POA: Diagnosis not present

## 2020-08-17 DIAGNOSIS — D631 Anemia in chronic kidney disease: Secondary | ICD-10-CM | POA: Diagnosis not present

## 2020-08-17 DIAGNOSIS — Z87442 Personal history of urinary calculi: Secondary | ICD-10-CM | POA: Insufficient documentation

## 2020-08-17 DIAGNOSIS — N184 Chronic kidney disease, stage 4 (severe): Secondary | ICD-10-CM | POA: Diagnosis not present

## 2020-08-17 DIAGNOSIS — D696 Thrombocytopenia, unspecified: Secondary | ICD-10-CM | POA: Insufficient documentation

## 2020-08-17 DIAGNOSIS — Z8249 Family history of ischemic heart disease and other diseases of the circulatory system: Secondary | ICD-10-CM | POA: Insufficient documentation

## 2020-08-17 DIAGNOSIS — N052 Unspecified nephritic syndrome with diffuse membranous glomerulonephritis: Secondary | ICD-10-CM | POA: Diagnosis not present

## 2020-08-17 DIAGNOSIS — K449 Diaphragmatic hernia without obstruction or gangrene: Secondary | ICD-10-CM | POA: Insufficient documentation

## 2020-08-17 DIAGNOSIS — Z87891 Personal history of nicotine dependence: Secondary | ICD-10-CM | POA: Insufficient documentation

## 2020-08-17 DIAGNOSIS — Z85820 Personal history of malignant melanoma of skin: Secondary | ICD-10-CM | POA: Diagnosis not present

## 2020-08-17 DIAGNOSIS — Z803 Family history of malignant neoplasm of breast: Secondary | ICD-10-CM | POA: Diagnosis not present

## 2020-08-17 DIAGNOSIS — D509 Iron deficiency anemia, unspecified: Secondary | ICD-10-CM | POA: Insufficient documentation

## 2020-08-17 DIAGNOSIS — N400 Enlarged prostate without lower urinary tract symptoms: Secondary | ICD-10-CM | POA: Diagnosis not present

## 2020-08-17 DIAGNOSIS — R809 Proteinuria, unspecified: Secondary | ICD-10-CM | POA: Diagnosis not present

## 2020-08-17 DIAGNOSIS — I129 Hypertensive chronic kidney disease with stage 1 through stage 4 chronic kidney disease, or unspecified chronic kidney disease: Secondary | ICD-10-CM | POA: Insufficient documentation

## 2020-08-17 DIAGNOSIS — C884 Extranodal marginal zone B-cell lymphoma of mucosa-associated lymphoid tissue [MALT-lymphoma]: Secondary | ICD-10-CM | POA: Diagnosis not present

## 2020-08-17 DIAGNOSIS — Z836 Family history of other diseases of the respiratory system: Secondary | ICD-10-CM | POA: Insufficient documentation

## 2020-08-17 DIAGNOSIS — N2581 Secondary hyperparathyroidism of renal origin: Secondary | ICD-10-CM | POA: Diagnosis not present

## 2020-08-17 DIAGNOSIS — Z79899 Other long term (current) drug therapy: Secondary | ICD-10-CM | POA: Insufficient documentation

## 2020-08-17 LAB — CBC WITH DIFFERENTIAL/PLATELET
Abs Immature Granulocytes: 0.05 10*3/uL (ref 0.00–0.07)
Basophils Absolute: 0 10*3/uL (ref 0.0–0.1)
Basophils Relative: 1 %
Eosinophils Absolute: 0.1 10*3/uL (ref 0.0–0.5)
Eosinophils Relative: 2 %
HCT: 49.5 % (ref 39.0–52.0)
Hemoglobin: 16.8 g/dL (ref 13.0–17.0)
Immature Granulocytes: 1 %
Lymphocytes Relative: 14 %
Lymphs Abs: 0.8 10*3/uL (ref 0.7–4.0)
MCH: 31.8 pg (ref 26.0–34.0)
MCHC: 33.9 g/dL (ref 30.0–36.0)
MCV: 93.6 fL (ref 80.0–100.0)
Monocytes Absolute: 0.8 10*3/uL (ref 0.1–1.0)
Monocytes Relative: 13 %
Neutro Abs: 4 10*3/uL (ref 1.7–7.7)
Neutrophils Relative %: 69 %
Platelets: 142 10*3/uL — ABNORMAL LOW (ref 150–400)
RBC: 5.29 MIL/uL (ref 4.22–5.81)
RDW: 15 % (ref 11.5–15.5)
WBC: 5.8 10*3/uL (ref 4.0–10.5)
nRBC: 0 % (ref 0.0–0.2)

## 2020-08-17 LAB — COMPREHENSIVE METABOLIC PANEL
ALT: 57 U/L — ABNORMAL HIGH (ref 0–44)
AST: 40 U/L (ref 15–41)
Albumin: 4.1 g/dL (ref 3.5–5.0)
Alkaline Phosphatase: 76 U/L (ref 38–126)
Anion gap: 11 (ref 5–15)
BUN: 37 mg/dL — ABNORMAL HIGH (ref 8–23)
CO2: 26 mmol/L (ref 22–32)
Calcium: 9.5 mg/dL (ref 8.9–10.3)
Chloride: 100 mmol/L (ref 98–111)
Creatinine, Ser: 2.87 mg/dL — ABNORMAL HIGH (ref 0.61–1.24)
GFR, Estimated: 22 mL/min — ABNORMAL LOW (ref >60)
Glucose, Bld: 155 mg/dL — ABNORMAL HIGH (ref 70–99)
Potassium: 4.3 mmol/L (ref 3.5–5.1)
Sodium: 137 mmol/L (ref 135–145)
Total Bilirubin: 0.7 mg/dL (ref 0.3–1.2)
Total Protein: 7.3 g/dL (ref 6.5–8.1)

## 2020-08-17 LAB — IRON AND TIBC
Iron: 123 ug/dL (ref 45–182)
Saturation Ratios: 36 % (ref 17.9–39.5)
TIBC: 340 ug/dL (ref 250–450)
UIBC: 217 ug/dL

## 2020-08-17 LAB — FERRITIN: Ferritin: 44 ng/mL (ref 24–336)

## 2020-08-17 LAB — LACTATE DEHYDROGENASE: LDH: 154 U/L (ref 98–192)

## 2020-08-17 NOTE — Progress Notes (Signed)
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 2.4 cm hypoattenuating renal lesion consistent with a cyst he underwent EGD which showed widely patent Schatzki's ring in the distal esophagus. Small hiatal hernia. Any area of abnormal mucosa with multifocal areas of ulceration with heaped up edges as well as multiple pigmented spots within the ulcers found in the anterior 1/[place]ovich of the stomach. It appears that there is any mass-effect/extrinsic compression of the stomach versus primary gastric malignancy. No active bleeding as of recent bleeding. 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 Ki67 5%.No rearrangement of MALT 1 was observedH. pylori immunostain negative.Repeat stool antigen H. pylori testing was also negative.he received EBRT to the involved area   Interval history-patient had an EGD by Dr. Marius Ditch on 04/27/2020 which was within normal limits.  He will be on a surveillance upper endoscopy schedule through her.  Presently he is trying to lose weight.  He is concerned about  his kidney functions which have remained stable overall.  He is not taking any mycophenolate at this time.  ECOG PS- 1 Pain scale- 0   Review of systems- Review of Systems  Constitutional: Positive for malaise/fatigue. Negative for chills, fever and weight loss.  HENT: Negative for congestion, ear discharge and nosebleeds.   Eyes: Negative for blurred vision.  Respiratory: Negative for cough, hemoptysis, sputum production, shortness of breath and wheezing.   Cardiovascular: Negative for chest pain, palpitations, orthopnea and claudication.  Gastrointestinal: Negative for abdominal pain, blood in stool, constipation, diarrhea, heartburn, melena, nausea and vomiting.  Genitourinary: Negative for dysuria, flank pain, frequency, hematuria and urgency.  Musculoskeletal: Negative for back pain, joint pain and myalgias.  Skin: Negative for rash.  Neurological: Negative for dizziness, tingling, focal weakness, seizures, weakness and headaches.  Endo/Heme/Allergies: Does not bruise/bleed easily.  Psychiatric/Behavioral: Negative for depression and suicidal ideas. The patient does not have insomnia.        No Known Allergies   Past Medical History:  Diagnosis Date  . Basal cell carcinoma 2020   back of neck  . Cancer ([place]) 1991   malignant melanoma  . Chronic kidney disease   . Essential hypertension   . Heart murmur   . History of diverticulitis 06/2014  . History of kidney stones   . Hyperlipidemia   . MALT lymphoma ([place])   . Membranous glomerulonephritis   . Myasthenia gravis ([place])   . Obesity      Past Surgical History:  Procedure Laterality Date  . ESOPHAGOGASTRODUODENOSCOPY (EGD) WITH PROPOFOL N/A 04/27/2020   Procedure: ESOPHAGOGASTRODUODENOSCOPY (EGD) WITH PROPOFOL;  Surgeon: Lin Landsman, MD;  Location: [place];  Service: Gastroenterology;  Laterality:  N/A;  . EYE SURGERY     cataract surgery done at [place]   . LITHOTRIPSY    . submandibular gland removal  Left     Social History   Socioeconomic History  . Marital status: Married    Spouse name: Not on file  . Number of children: Not on file  . Years of education: Not on file  . Highest education level: High school graduate  Occupational History  . Not on file  Tobacco Use  . Smoking status: Former Smoker    Quit date: 05/14/1969    Years since quitting: 51.2  . Smokeless tobacco: Never Used  Vaping Use  . Vaping Use: Never used  Substance and Sexual Activity  . Alcohol use: No  . Drug use: No  . Sexual activity: Not Currently  Other Topics Concern  . Not on file  Social History Narrative  . Not on file   Social Determinants of Health   Financial Resource Strain: Not on file  Food Insecurity: Not on file  Transportation Needs: Not on file  Physical Activity: Not on file  Stress: Not on file  Social Connections: Not on file  Intimate Partner Violence: Not on file    Family History  Problem Relation Age of Onset  . Hypertension Mother   . Thyroid disease Mother   . Breast cancer Mother   . Asthma Father      Current Outpatient Medications:  .  atorvastatin (LIPITOR) 40 MG tablet, Take 1 tablet (40 mg total) by mouth daily., Disp: 90 tablet, Rfl: 1 .  Cholecalciferol (VITAMIN D3 ULTRA STRENGTH) 125 MCG (5000 UT) capsule, Take 5,000 Units by mouth daily., Disp: , Rfl:  .  diltiazem (TIAZAC) 120 MG 24 hr capsule, Take 1 capsule by mouth daily., Disp: , Rfl:  .  dutasteride (AVODART) 0.5 MG capsule, SMARTSIG:1 Capsule(s) By Mouth Every Evening, Disp: , Rfl:  .  famotidine (PEPCID) 20 MG tablet, Take 20 mg by mouth 2 (two) times daily., Disp: , Rfl:  .  famotidine (PEPCID) 40 MG tablet, Take 1 tablet by mouth as needed for heartburn., Disp: , Rfl:  .  ferrous sulfate 325 (65 FE) MG tablet, Take 1 tablet by mouth daily with breakfast., Disp: , Rfl:  .  gabapentin (NEURONTIN) 300 MG capsule, Take by mouth., Disp: , Rfl:  .  levothyroxine (SYNTHROID) 175 MCG tablet, Take  1 tablet (175 mcg total) by mouth daily before breakfast., Disp: 90 tablet, Rfl: 0 .  losartan (COZAAR) 100 MG tablet, Take 1 tablet by mouth daily., Disp: , Rfl:  .  Nutritional Supplements (THERALITH XR PO), Take by mouth 2 (two) times daily. Takes 2 tablets in AM and 2 tablets in PM, Disp: , Rfl:  .  Omega-3 Fatty Acids (OMEGA 3 PO), Take by mouth 2 (two) times daily., Disp: , Rfl:  .  pyridostigmine (MESTINON) 60 MG tablet, Take 60 mg by mouth 2 (two) times daily. , Disp: , Rfl:  .  Saw Palmetto, Serenoa repens, 450 MG CAPS, Take 1,350 mg by mouth daily. , Disp: , Rfl:  .  tamsulosin (FLOMAX) 0.4 MG CAPS capsule, SMARTSIG:1 Capsule(s) By Mouth Every Evening, Disp: , Rfl:  .  triamcinolone (KENALOG) 0.1 %, Apply 1 application topically 2 (two) times daily as needed., Disp: , Rfl:  .  ondansetron (ZOFRAN) 8 MG tablet, Take 1 tablet (8 mg total) by mouth every 8 (eight) hours as needed for nausea or vomiting. (Patient not taking: Reported on  08/17/2020), Disp: 60 tablet, Rfl: 2  Physical exam:  Vitals:   08/17/20 1054  BP: 122/70  Pulse: 75  Resp: 16  Temp: 97.8 F (36.6 C)  TempSrc: Oral  Weight: 225 lb (102.1 kg)  Height: _0  (1.778 m)   Physical Exam Constitutional:      General: He is not in acute distress. Eyes:     Extraocular Movements: EOM normal.  Cardiovascular:     Rate and Rhythm: Normal rate and regular rhythm.     Heart sounds: Normal heart sounds.  Pulmonary:     [place]: Pulmonary effort is normal.     Breath sounds: Normal breath sounds.  Abdominal:     General: Bowel sounds are normal.     Palpations: Abdomen is soft.  Lymphadenopathy:     Comments: No palpable cervical, supraclavicular, axillary or inguinal adenopathy   Skin:    General: Skin is warm and dry.  Neurological:     Mental Status: He is alert and oriented to person, place, and time.      CMP Latest Ref Rng & Units 08/17/2020  Glucose 70 - 99 mg/dL 155(H)  BUN 8 - 23 mg/dL 37(H)   Creatinine 0.61 - 1.24 mg/dL 2.87(H)  Sodium 135 - 145 mmol/L 137  Potassium 3.5 - 5.1 mmol/L 4.3  Chloride 98 - 111 mmol/L 100  CO2 22 - 32 mmol/L 26  Calcium 8.9 - 10.3 mg/dL 9.5  Total Protein 6.5 - 8.1 g/dL 7.3  Total Bilirubin 0.3 - 1.2 mg/dL 0.7  Alkaline Phos 38 - 126 U/L 76  AST 15 - 41 U/L 40  ALT 0 - 44 U/L 57(H)   CBC Latest Ref Rng & Units 08/17/2020  WBC 4.0 - 10.5 K/uL 5.8  Hemoglobin 13.0 - 17.0 g/dL 16.8  Hematocrit 39.0 - 52.0 % 49.5  Platelets 150 - 400 K/uL 142(L)     Assessment and plan- Patient is a 75 y.o. male with Stage I MALT lymphoma of stomach H pylori negative.  He is s/p EBRT and is here for routine follow-up  Clinically patient is doing well with no concerning's signs and symptoms of systemic recurrence.  Recent upper endoscopy in November 2022 was within normal limits.  Optimal interval for repeat endoscopy is unclear and may be symptom driven.  I will see him back in 6 months with CBC with differential ferritin and iron studies and LDH.  Mild thrombocytopenia.  Continue to monitor  CKD: Appears to be stable   Visit Diagnosis 1. MALT lymphoma ([place])   2. Iron deficiency anemia, unspecified iron deficiency anemia type      Dr. [name], MD, MPH [facility] [phone] 08/17/2020 1:47 PM

## 2020-08-17 NOTE — Progress Notes (Signed)
Pt has hx of neuropathy- it has not changed andit impairs his feet. He is on a nutri system diet.. no concerns

## 2020-08-21 DIAGNOSIS — I129 Hypertensive chronic kidney disease with stage 1 through stage 4 chronic kidney disease, or unspecified chronic kidney disease: Secondary | ICD-10-CM | POA: Diagnosis not present

## 2020-08-21 DIAGNOSIS — R809 Proteinuria, unspecified: Secondary | ICD-10-CM | POA: Diagnosis not present

## 2020-08-21 DIAGNOSIS — N052 Unspecified nephritic syndrome with diffuse membranous glomerulonephritis: Secondary | ICD-10-CM | POA: Diagnosis not present

## 2020-08-21 DIAGNOSIS — D631 Anemia in chronic kidney disease: Secondary | ICD-10-CM | POA: Diagnosis not present

## 2020-08-21 DIAGNOSIS — N2581 Secondary hyperparathyroidism of renal origin: Secondary | ICD-10-CM | POA: Diagnosis not present

## 2020-08-21 DIAGNOSIS — N184 Chronic kidney disease, stage 4 (severe): Secondary | ICD-10-CM | POA: Diagnosis not present

## 2020-08-23 DIAGNOSIS — R972 Elevated prostate specific antigen [PSA]: Secondary | ICD-10-CM | POA: Diagnosis not present

## 2020-08-23 DIAGNOSIS — N401 Enlarged prostate with lower urinary tract symptoms: Secondary | ICD-10-CM | POA: Diagnosis not present

## 2020-08-24 DIAGNOSIS — D225 Melanocytic nevi of trunk: Secondary | ICD-10-CM | POA: Diagnosis not present

## 2020-08-24 DIAGNOSIS — X32XXXA Exposure to sunlight, initial encounter: Secondary | ICD-10-CM | POA: Diagnosis not present

## 2020-08-24 DIAGNOSIS — C44612 Basal cell carcinoma of skin of right upper limb, including shoulder: Secondary | ICD-10-CM | POA: Diagnosis not present

## 2020-08-24 DIAGNOSIS — D485 Neoplasm of uncertain behavior of skin: Secondary | ICD-10-CM | POA: Diagnosis not present

## 2020-08-24 DIAGNOSIS — D2271 Melanocytic nevi of right lower limb, including hip: Secondary | ICD-10-CM | POA: Diagnosis not present

## 2020-08-24 DIAGNOSIS — D2262 Melanocytic nevi of left upper limb, including shoulder: Secondary | ICD-10-CM | POA: Diagnosis not present

## 2020-08-24 DIAGNOSIS — C44529 Squamous cell carcinoma of skin of other part of trunk: Secondary | ICD-10-CM | POA: Diagnosis not present

## 2020-08-24 DIAGNOSIS — L570 Actinic keratosis: Secondary | ICD-10-CM | POA: Diagnosis not present

## 2020-08-24 DIAGNOSIS — Z85820 Personal history of malignant melanoma of skin: Secondary | ICD-10-CM | POA: Diagnosis not present

## 2020-08-24 DIAGNOSIS — L820 Inflamed seborrheic keratosis: Secondary | ICD-10-CM | POA: Diagnosis not present

## 2020-08-24 DIAGNOSIS — Z85828 Personal history of other malignant neoplasm of skin: Secondary | ICD-10-CM | POA: Diagnosis not present

## 2020-08-24 DIAGNOSIS — L538 Other specified erythematous conditions: Secondary | ICD-10-CM | POA: Diagnosis not present

## 2020-08-28 ENCOUNTER — Telehealth: Payer: Self-pay | Admitting: *Deleted

## 2020-08-28 NOTE — Telephone Encounter (Signed)
Call returned to patient and advised to call his kidney doctor and he said he had and was told to take an allergy pill which he said he has done and it is no better. I advised that he call him back and let him know this. He said he would call kidney doctor

## 2020-08-28 NOTE — Telephone Encounter (Signed)
Patient called reporting that he has itching that is getting worse on his back and sides for the past week. He has seen his dermatologist for this and was told it could be coming form a number of things including his liver or kidneys or even his cancer and was not given anything for the itching. He statse he also contacted his PCP who told him that nothing is wrong with his liver. He has been using Sarna lotion, but finds that he is having to use it more frequently now. He is asking if we can give him something to help with the itching. Please advise

## 2020-08-28 NOTE — Telephone Encounter (Signed)
Sorry to hear this. I've reviewed Dr. Elroy Channel note and I don't believe the itching is related to his lymphoma. I would discuss with his kidney doctor to see if this may be contributing.

## 2020-09-05 ENCOUNTER — Other Ambulatory Visit: Payer: Self-pay

## 2020-09-05 ENCOUNTER — Encounter: Payer: PPO | Attending: Internal Medicine

## 2020-09-05 VITALS — Ht 69.5 in | Wt 225.1 lb

## 2020-09-05 DIAGNOSIS — C884 Extranodal marginal zone B-cell lymphoma of mucosa-associated lymphoid tissue [MALT-lymphoma]: Secondary | ICD-10-CM

## 2020-09-05 NOTE — Progress Notes (Signed)
Daily Session Note  Patient Details  Name: Bill Aguilar MRN: [id number] Date of Birth: 1946-01-11 Referring Provider:    Encounter Date: 09/05/2020  Check In:  Session Check In - 09/05/20 1340      Check-In   Supervising physician immediately available to respond to emergencies See telemetry face sheet for immediately available ER MD    Location [facility]-Cardiac & Pulmonary Rehab    Staff Present Coralie Keens, MS Exercise Physiologist;Jessica Luan Pulling, MA, RCEP, CCRP, CCET    Virtual Visit No    Medication changes reported     No    Fall or balance concerns reported    No    Warm-up and Cool-down Not performed (comment)   6MWT and Gym Orientation   Resistance Training Performed No   6MWT   PAD/SET Patient? No      Pain Assessment   Currently in Pain? No/denies             Exercise Prescription Changes - 09/05/20 1300      Response to Exercise   Blood Pressure (Admit) 120/64    Blood Pressure (Exercise) 140/60    Blood Pressure (Exit) 118/64    Heart Rate (Admit) 80 bpm    Heart Rate (Exercise) 100 bpm    Heart Rate (Exit) 75 bpm    Oxygen Saturation (Admit) 95 %    Oxygen Saturation (Exercise) 93 %    Oxygen Saturation (Exit) 96 %    Rating of Perceived Exertion (Exercise) 7    Perceived Dyspnea (Exercise) 1    Symptoms Bilateral hip pain 1/10    Comments walk test results      Resistance Training   Weight 3 lb            6 Minute Walk    Row Name 09/05/20 1341         6 Minute Walk   Phase Initial     Distance 1260 feet     Walk Time 6 minutes     # of Rest Breaks 0     MPH 2.38     METS 2.28     RPE 7     Perceived Dyspnea  1     VO2 Peak 7.98     Symptoms Yes (comment)     Comments Bilateral hip pain 1/10     Resting HR 80 bpm     Resting BP 120/64     Resting Oxygen Saturation  95 %     Exercise Oxygen Saturation  during 6 min walk 93 %     Max Ex. HR 100 bpm     Max Ex. BP 140/60     2 Minute Post BP 118/64              Activity Barriers & Cardiac Risk Stratification - 09/05/20 1346      Activity Barriers & Cardiac Risk Stratification   Activity Barriers Other (comment)    Comments Hip discomfort           Social History   Tobacco Use  Smoking Status Former Smoker  . Quit date: 05/14/1969  . Years since quitting: 51.3  Smokeless Tobacco Never Used    Goals Met:  Proper associated with RPD/PD & O2 Sat Exercise tolerated well Personal goals reviewed Queuing for purse lip breathing No report of cardiac concerns or symptoms  Goals Unmet:  Not Applicable  Comments: First full day of orientation. All starting workloads were established based on the results  of the 6 minute walk test done at initial orientation visit.  The plan for exercise progression was also introduced and progression will be customized based on patient's performance and goals.   Dr. Emily Filbert is Medical Director for [place] and [facility].

## 2020-09-07 ENCOUNTER — Other Ambulatory Visit: Payer: Self-pay

## 2020-09-07 DIAGNOSIS — C884 Extranodal marginal zone B-cell lymphoma of mucosa-associated lymphoid tissue [MALT-lymphoma]: Secondary | ICD-10-CM

## 2020-09-07 NOTE — Progress Notes (Signed)
Daily Session Note  Patient Details  Name: Bill Aguilar MRN: [id number] Date of Birth: 1946/02/05 Referring Provider:   April Manson Cancer Associated Rehabilitation & Exercise from 09/05/2020 in [facility] Cardiac and Pulmonary Rehab  Referring Provider [name] MD      Encounter Date: 09/07/2020  Check In:  Session Check In - 09/07/20 1240      Check-In   Supervising physician immediately available to respond to emergencies See telemetry face sheet for immediately available ER MD    Location [facility]Cardiac [facility] Pulmonary Rehab    Staff Present Birdie Sons, MPA, Elveria Rising, BA, ACSM CEP, Exercise Physiologist;Kara Eliezer Bottom, MS Exercise Physiologist    Virtual Visit No    Medication changes reported     No    Fall or balance concerns reported    No    Warm-up and Cool-down Performed on first and last piece of equipment    Resistance Training Performed Yes    VAD Patient? No    PAD/SET Patient? No      Pain Assessment   Currently in Pain? No/denies              Social History   Tobacco Use  Smoking Status Former Smoker  . Quit date: 05/14/1969  . Years since quitting: 51.3  Smokeless Tobacco Never Used    Goals Met:  Independence with exercise equipment Exercise tolerated well No report of cardiac concerns or symptoms Strength training completed today  Goals Unmet:  Not Applicable  Comments: Pt able to follow exercise prescription today without complaint.  Will continue to monitor for progression.    Dr. Emily Filbert is Medical Director for [place] and [facility].

## 2020-09-12 ENCOUNTER — Other Ambulatory Visit: Payer: Self-pay

## 2020-09-12 DIAGNOSIS — C884 Extranodal marginal zone B-cell lymphoma of mucosa-associated lymphoid tissue [MALT-lymphoma]: Secondary | ICD-10-CM

## 2020-09-12 NOTE — Progress Notes (Signed)
Daily Session Note  Patient Details  Name: Bill Aguilar MRN: [id number] Date of Birth: 1945/09/01 Referring Provider:   April Manson Cancer Associated Rehabilitation & Exercise from 09/05/2020 in [facility] Cardiac and Pulmonary Rehab  Referring Provider [name] MD      Encounter Date: 09/12/2020  Check In:  Session Check In - 09/12/20 1248      Check-In   Supervising physician immediately available to respond to emergencies See telemetry face sheet for immediately available ER MD    Location [facility]Card[facility] Pul[facility] Re[facility]    Staff Present Birdie Sons, MPA, RN;Jessica Luan Pulling, MA, RCEP, CCRP, Marylynn Pearson, MS Exercise Physiologist    Virtual Visit No    Medication changes reported     No    Fall or balance concerns reported    No    Warm-up and Cool-down Performed on first and last piece of equipment    Resistance Training Performed Yes    VAD Patient? No    PAD/SET Patient? No      Pain Assessment   Currently in Pain? No/denies              Social History   Tobacco Use  Smoking Status Former Smoker  . Quit date: 05/14/1969  . Years since quitting: 51.3  Smokeless Tobacco Never Used    Goals Met:  Independence with exercise equipment Exercise tolerated well No report of cardiac concerns or symptoms Strength training completed today  Goals Unmet:  Not Applicable  Comments: Pt able to follow exercise prescription today without complaint.  Will continue to monitor for progression.    Dr. Emily Filbert is Medical Director for [place] and [facility].

## 2020-09-21 ENCOUNTER — Other Ambulatory Visit: Payer: Self-pay

## 2020-09-21 DIAGNOSIS — C884 Extranodal marginal zone b-cell lymphoma of mucosa-associated lymphoid tissue (malt-lymphoma) not having achieved remission: Secondary | ICD-10-CM

## 2020-09-21 NOTE — Progress Notes (Signed)
Daily Session Note  Patient Details  Name: Bill Aguilar MRN: [id number] Date of Birth: 12/28/1945 Referring Provider:   April Aguilar Cancer Associated Rehabilitation & Exercise from 09/05/2020 in [facility] Cardiac and Pulmonary Rehab  Referring Provider Bill Evens MD      Encounter Date: 09/21/2020  Check In:  Session Check In - 09/21/20 1230      Check-In   Supervising physician immediately available to respond to emergencies See telemetry face sheet for immediately available ER MD    Location [facility] Pul[facility] Re[facility]    Staff Present Bill Aguilar, MPA, Bill Aguilar, BA, ACSM CEP, Exercise Physiologist;Bill Mel Almond, RN, BSN    Virtual Visit No    Medication changes reported     No    Fall or balance concerns reported    No    Warm-up and Cool-down Performed on first and last piece of equipment    Resistance Training Performed Yes    VAD Patient? No    PAD/SET Patient? No      Pain Assessment   Currently in Pain? No/denies              Social History   Tobacco Use  Smoking Status Former Smoker  . Quit date: 05/14/1969  . Years since quitting: 51.3  Smokeless Tobacco Never Used    Goals Met:  Independence with exercise equipment Exercise tolerated well No report of cardiac concerns or symptoms Strength training completed today  Goals Unmet:  Not Applicable  Comments: Pt able to follow exercise prescription today without complaint.  Will continue to monitor for progression.    Dr. Emily Aguilar is Medical Director for [facility].

## 2020-09-26 DIAGNOSIS — L299 Pruritus, unspecified: Secondary | ICD-10-CM | POA: Diagnosis not present

## 2020-09-28 ENCOUNTER — Other Ambulatory Visit: Payer: Self-pay

## 2020-09-28 ENCOUNTER — Encounter: Payer: PPO | Attending: Internal Medicine | Admitting: *Deleted

## 2020-09-28 DIAGNOSIS — C884 Extranodal marginal zone B-cell lymphoma of mucosa-associated lymphoid tissue [MALT-lymphoma]: Secondary | ICD-10-CM | POA: Insufficient documentation

## 2020-09-28 NOTE — Progress Notes (Signed)
Daily Session Note  Patient Details  Name: Kendale Rembold MRN: [id number] Date of Birth: 06/07/1946 Referring Provider:   April Manson Cancer Associated Rehabilitation & Exercise from 09/05/2020 in [facility] Cardiac and Pulmonary Rehab  Referring Provider [name] MD      Encounter Date: 09/28/2020  Check In:  Session Check In - 09/28/20 1226      Check-In   Supervising physician immediately available to respond to emergencies See telemetry face sheet for immediately available ER MD    Location [facility]Card[facility] Pulmonary Re[facility]    Staff Present Nada Maclachlan, BA, ACSM CEP, Exercise Physiologist;Kara Eliezer Bottom, MS Exercise Physiologist    Virtual Visit No    Medication changes reported     No    Fall or balance concerns reported    No    Warm-up and Cool-down Performed on first and last piece of equipment    Resistance Training Performed Yes    VAD Patient? No    PAD/SET Patient? No      Pain Assessment   Currently in Pain? No/denies              Social History   Tobacco Use  Smoking Status Former Smoker  . Quit date: 05/14/1969  . Years since quitting: 51.4  Smokeless Tobacco Never Used    Goals Met:  Proper associated with RPD/PD & O2 Sat Independence with exercise equipment Exercise tolerated well No report of cardiac concerns or symptoms Strength training completed today  Goals Unmet:  Not Applicable  Comments: Pt able to follow exercise prescription today without complaint.  Will continue to monitor for progression.    Dr. Emily Filbert is Medical Director for [place] and [facility].

## 2020-10-03 ENCOUNTER — Other Ambulatory Visit: Payer: Self-pay

## 2020-10-03 DIAGNOSIS — C884 Extranodal marginal zone B-cell lymphoma of mucosa-associated lymphoid tissue [MALT-lymphoma]: Secondary | ICD-10-CM

## 2020-10-03 NOTE — Progress Notes (Signed)
Daily Session Note  Patient Details  Name: Bill Aguilar MRN: [id number] Date of Birth: June 03, 1946 Referring Provider:   April Manson Cancer Associated Rehabilitation & Exercise from 09/05/2020 in [facility] Cardiac and Pulmonary Rehab  Referring Provider [name] MD      Encounter Date: 10/03/2020  Check In:  Session Check In - 10/03/20 1239      Check-In   Supervising physician immediately available to respond to emergencies See telemetry face sheet for immediately available ER MD    Location [facility] Pul[facility] Re[facility]    Staff Present Birdie Sons, MPA, Nino Glow, MS Exercise Physiologist;Jessica Luan Pulling, MA, RCEP, CCRP, CCET    Virtual Visit No    Medication changes reported     No    Fall or balance concerns reported    No    Warm-up and Cool-down Performed on first and last piece of equipment    Resistance Training Performed Yes    VAD Patient? No    PAD/SET Patient? No      Pain Assessment   Currently in Pain? No/denies              Social History   Tobacco Use  Smoking Status Former Smoker  . Quit date: 05/14/1969  . Years since quitting: 51.4  Smokeless Tobacco Never Used    Goals Met:  Independence with exercise equipment Exercise tolerated well No report of cardiac concerns or symptoms Strength training completed today  Goals Unmet:  Not Applicable  Comments: Pt able to follow exercise prescription today without complaint.  Will continue to monitor for progression.    Dr. Emily Filbert is Medical Director for [place] and [facility].

## 2020-10-05 ENCOUNTER — Other Ambulatory Visit: Payer: Self-pay

## 2020-10-05 DIAGNOSIS — C884 Extranodal marginal zone B-cell lymphoma of mucosa-associated lymphoid tissue [MALT-lymphoma]: Secondary | ICD-10-CM

## 2020-10-05 NOTE — Progress Notes (Signed)
Daily Session Note  Patient Details  Name: Abed Schar MRN: [id number] Date of Birth: 12-06-1945 Referring Provider:   April Manson Cancer Associated Rehabilitation & Exercise from 09/05/2020 in [facility] Cardiac and Pulmonary Rehab  Referring Provider [name] MD      Encounter Date: 10/05/2020  Check In:  Session Check In - 10/05/20 1212      Check-In   Supervising physician immediately available to respond to emergencies See telemetry face sheet for immediately available ER MD    Location [facility]-Cardiac & Pulmonary Rehab    Staff Present Birdie Sons, MPA, Elveria Rising, BA, ACSM CEP, Exercise Physiologist;Kara Eliezer Bottom, MS Exercise Physiologist    Virtual Visit No    Medication changes reported     No    Fall or balance concerns reported    No    Warm-up and Cool-down Performed on first and last piece of equipment    Resistance Training Performed Yes    VAD Patient? No    PAD/SET Patient? No      Pain Assessment   Currently in Pain? No/denies              Social History   Tobacco Use  Smoking Status Former Smoker  . Quit date: 05/14/1969  . Years since quitting: 51.4  Smokeless Tobacco Never Used    Goals Met:  Independence with exercise equipment Exercise tolerated well No report of cardiac concerns or symptoms Strength training completed today  Goals Unmet:  Not Applicable  Comments: Pt able to follow exercise prescription today without complaint.  Will continue to monitor for progression.    Dr. Emily Filbert is Medical Director for [place] and [facility].

## 2020-10-16 DIAGNOSIS — R809 Proteinuria, unspecified: Secondary | ICD-10-CM | POA: Diagnosis not present

## 2020-10-16 DIAGNOSIS — D631 Anemia in chronic kidney disease: Secondary | ICD-10-CM | POA: Diagnosis not present

## 2020-10-16 DIAGNOSIS — N184 Chronic kidney disease, stage 4 (severe): Secondary | ICD-10-CM | POA: Diagnosis not present

## 2020-10-16 DIAGNOSIS — N2581 Secondary hyperparathyroidism of renal origin: Secondary | ICD-10-CM | POA: Diagnosis not present

## 2020-10-16 DIAGNOSIS — I129 Hypertensive chronic kidney disease with stage 1 through stage 4 chronic kidney disease, or unspecified chronic kidney disease: Secondary | ICD-10-CM | POA: Diagnosis not present

## 2020-10-16 DIAGNOSIS — N052 Unspecified nephritic syndrome with diffuse membranous glomerulonephritis: Secondary | ICD-10-CM | POA: Diagnosis not present

## 2020-10-17 ENCOUNTER — Telehealth: Payer: Self-pay

## 2020-10-17 DIAGNOSIS — L308 Other specified dermatitis: Secondary | ICD-10-CM | POA: Diagnosis not present

## 2020-10-17 DIAGNOSIS — C44529 Squamous cell carcinoma of skin of other part of trunk: Secondary | ICD-10-CM | POA: Diagnosis not present

## 2020-10-17 DIAGNOSIS — C884 Extranodal marginal zone B-cell lymphoma of mucosa-associated lymphoid tissue [MALT-lymphoma]: Secondary | ICD-10-CM

## 2020-10-17 DIAGNOSIS — L309 Dermatitis, unspecified: Secondary | ICD-10-CM | POA: Diagnosis not present

## 2020-10-17 NOTE — Progress Notes (Signed)
CARE Discharge Progress Report  Patient Details  Name: Bill Aguilar MRN: [id number] Date of Birth: 10-09-1945 Referring Provider:   [facility] from 09/05/2020 in [facility]  Referring Provider [name] MD       Number of Visits: 7  Reason for Discharge: Early Exit: Medical Receiving multiple stitches to back- not sure when able to exercise.   Smoking History:  Social History   Tobacco Use  Smoking Status Former Smoker  . Quit date: 05/14/1969  . 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[place])  ADL UCSD:   Initial Exercise Prescription:  Initial Exercise Prescription - 09/05/20 1300      Date of Initial Exercise RX and Referring Provider   Date 09/05/20    Referring Provider [name] MD      Treadmill   MPH 2.2    Grade 0    Minutes 15    METs 2.68      Recumbant Bike   Level 2    RPM 60    Watts 15    Minutes 15    METs 2.2      NuStep   Level 2    SPM 80    Minutes 15    METs 2.2      REL-XR   Level 2    Speed 50    Minutes 15    METs 2.2      Biostep-RELP   Level 1    SPM 80    Minutes 15    METs 2.2      Prescription Details   Frequency (times per week) 2    Duration Progress to 30 minutes of continuous aerobic without signs/symptoms of physical distress      Intensity   THRR 40-80% of Max Heartrate 105-131    Ratings of Perceived Exertion 11-13    Perceived Dyspnea 0-4      Progression   Progression Continue to progress workloads to maintain intensity without signs/symptoms of physical distress.      Resistance Training   Training Prescription Yes    Reps 10-15           Discharge Exercise Prescription (Final Exercise Prescription Changes):  Exercise Prescription Changes - 09/05/20 1300      Response to Exercise   Blood Pressure (Admit) 120/64    Blood Pressure (Exercise) 140/60    Blood  Pressure (Exit) 118/64    Heart Rate (Admit) 80 bpm    Heart Rate (Exercise) 100 bpm    Heart Rate (Exit) 75 bpm    Oxygen Saturation (Admit) 95 %    Oxygen Saturation (Exercise) 93 %    Oxygen Saturation (Exit) 96 %    Rating of Perceived Exertion (Exercise) 7    Perceived Dyspnea (Exercise) 1    Symptoms Bilateral hip pain 1/10    Comments walk test results      Resistance Training   Weight 3 lb           Functional Capacity:  6 Minute Walk    Row Name 09/05/20 1341         6 Minute Walk   Phase Initial     Distance 1260 feet     Walk Time 6 minutes     # of Rest Breaks 0     MPH 2.38     METS 2.28  RPE 7     Perceived Dyspnea  1     VO2 Peak 7.98     Symptoms Yes (comment)     Comments Bilateral hip pain 1/10     Resting HR 80 bpm     Resting BP 120/64     Resting Oxygen Saturation  95 %     Exercise Oxygen Saturation  during 6 min walk 93 %     Max Ex. HR 100 bpm     Max Ex. BP 140/60     2 Minute Post BP 118/64              Nutrition & Weight - Outcomes:  Pre Biometrics - 09/05/20 1346      Pre Biometrics   Height 5' 9.5" (1.765 m)    Weight 225 lb 1.6 oz (102.1 kg)    BMI (Calculated) 32.78    Single Leg Stand 2.47 seconds            Goals reviewed with patient; copy given to patient.

## 2020-10-17 NOTE — Telephone Encounter (Signed)
Patient called regarding CARE program. Called to let us know that he received 12 stiches to his back and is on limited mobility restrictions. He is supposed to get more in 2-3 weeks and is not sure when he will be able to return to exercise. Patient wishes to discharge at this time. I let patient know once he is feeling better and cleared to exercise if we wishes, can get a new referral from his provider if appropriate. Patient agrees and will keep [place] updated. Will discharge at this time.

## 2020-10-18 DIAGNOSIS — I129 Hypertensive chronic kidney disease with stage 1 through stage 4 chronic kidney disease, or unspecified chronic kidney disease: Secondary | ICD-10-CM | POA: Diagnosis not present

## 2020-10-18 DIAGNOSIS — R809 Proteinuria, unspecified: Secondary | ICD-10-CM | POA: Diagnosis not present

## 2020-10-18 DIAGNOSIS — N2581 Secondary hyperparathyroidism of renal origin: Secondary | ICD-10-CM | POA: Diagnosis not present

## 2020-10-18 DIAGNOSIS — N184 Chronic kidney disease, stage 4 (severe): Secondary | ICD-10-CM | POA: Diagnosis not present

## 2020-10-18 DIAGNOSIS — N052 Unspecified nephritic syndrome with diffuse membranous glomerulonephritis: Secondary | ICD-10-CM | POA: Diagnosis not present

## 2020-10-18 DIAGNOSIS — D631 Anemia in chronic kidney disease: Secondary | ICD-10-CM | POA: Diagnosis not present

## 2020-10-21 IMAGING — CT CT RENAL STONE PROTOCOL
2 of 4 series · 17 of 46 positions shown, 19 images · non-contrast
Comparison: None.

CLINICAL DATA: Flank pain

EXAM:
CT ABDOMEN AND PELVIS WITHOUT CONTRAST
TECHNIQUE: Multidetector CT imaging of the abdomen and pelvis was performed
following the standard protocol without IV contrast.

[Series 2: stone full standard · axial · 0.83mm/px · z∈[-687,-252]mm · 14 of 95 slices shown, 16 images]
[im 4/95  soft-tissue]
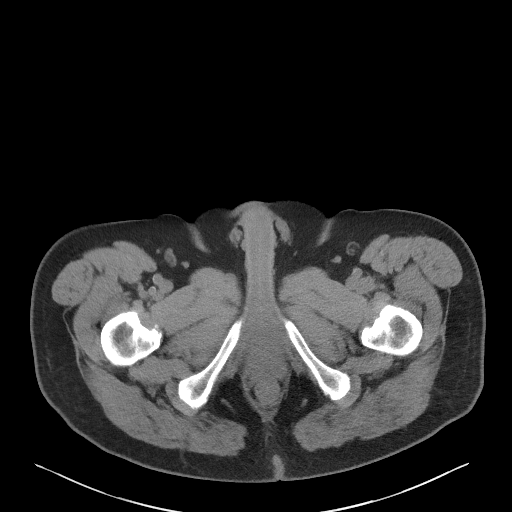
[im 4/95  bone]
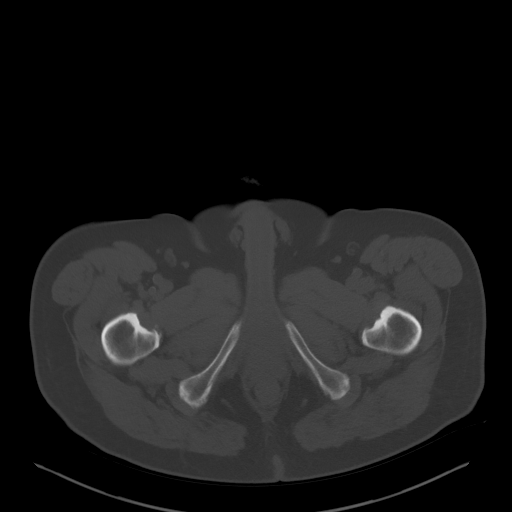
[im 12/95  soft-tissue]
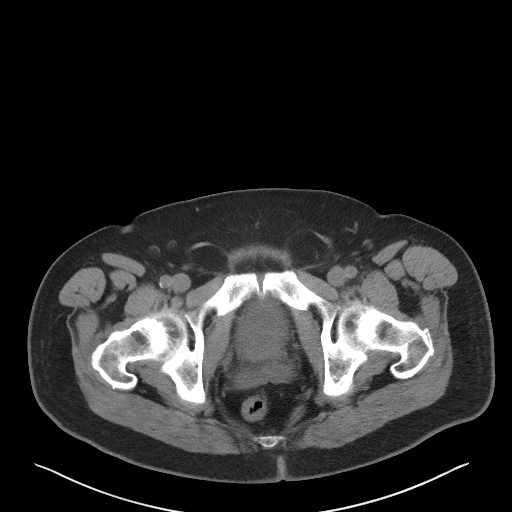
[im 20/95  soft-tissue]
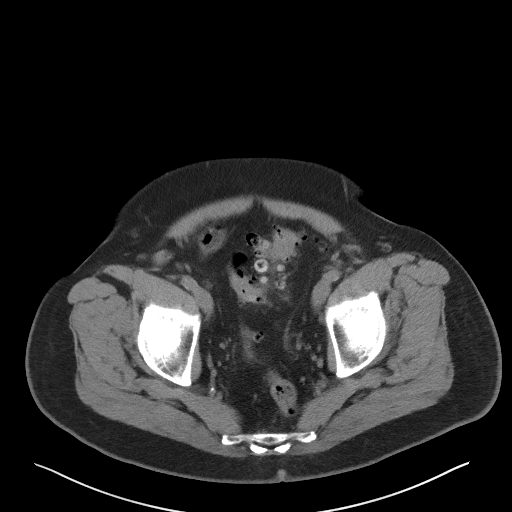
[im 24/95  soft-tissue]
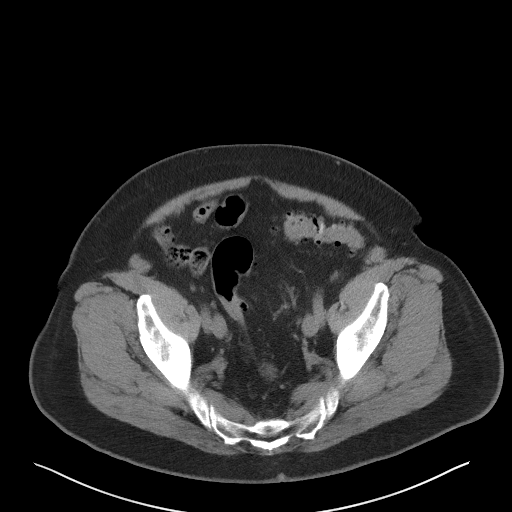
[im 32/95  soft-tissue]
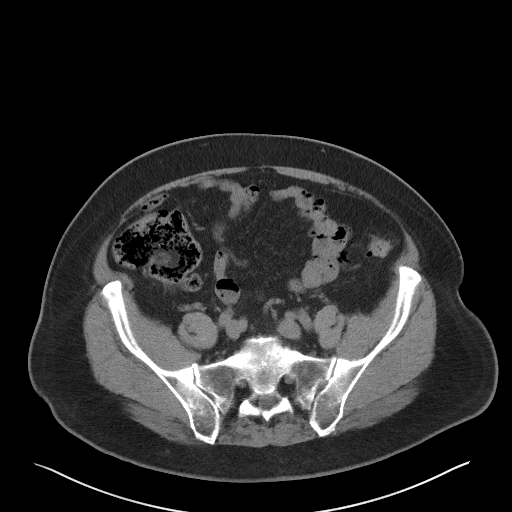
[im 40/95  soft-tissue]
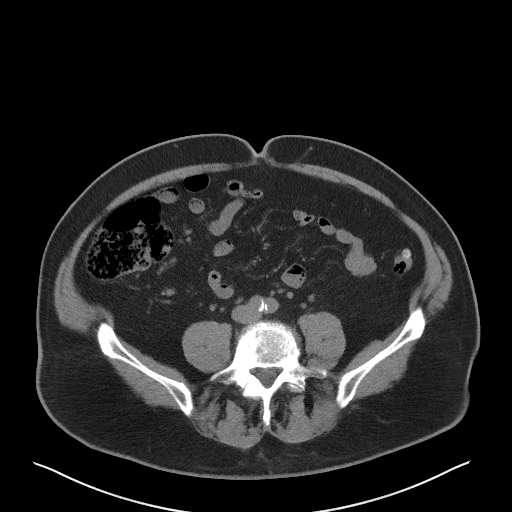
[im 44/95  soft-tissue]
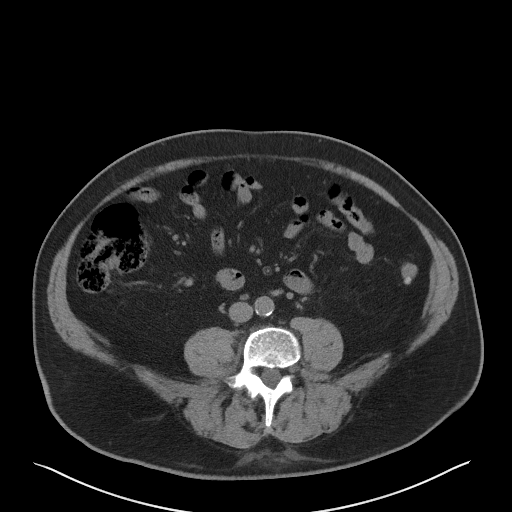
[im 51/95  soft-tissue]
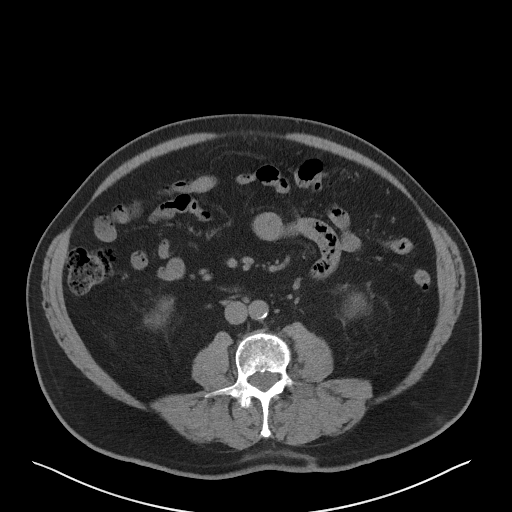
[im 55/95  soft-tissue]
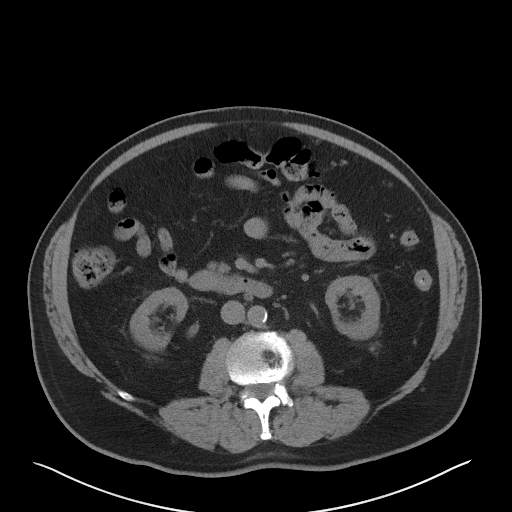
[im 55/95  bone]
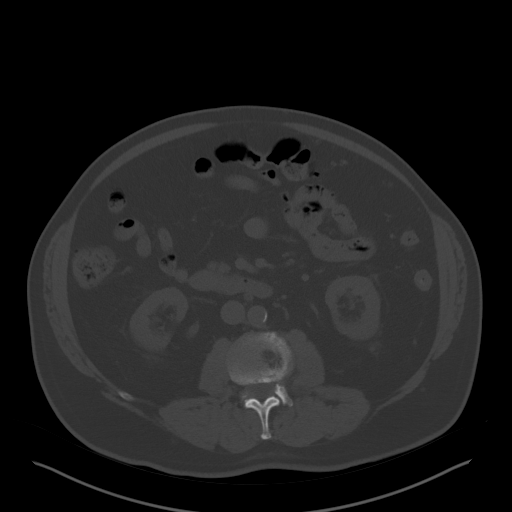
[im 63/95  soft-tissue]
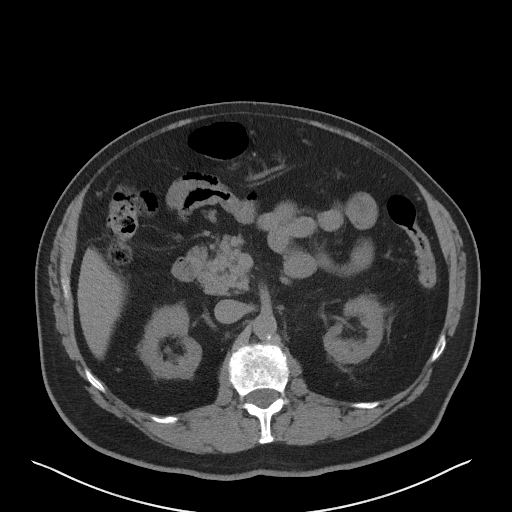
[im 71/95  soft-tissue]
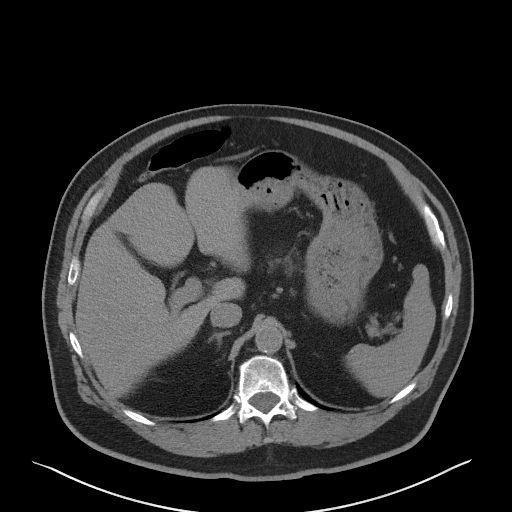
[im 75/95  soft-tissue]
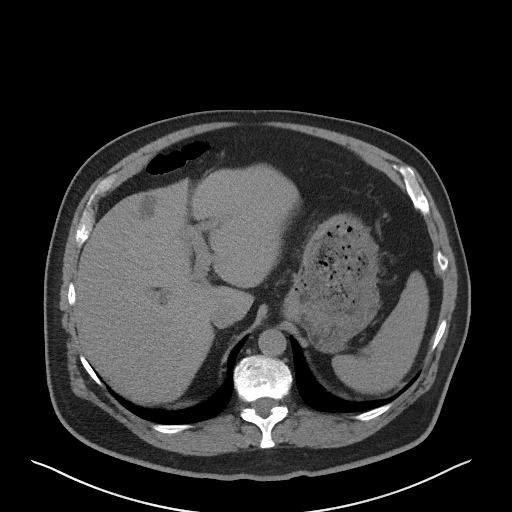
[im 83/95  soft-tissue]
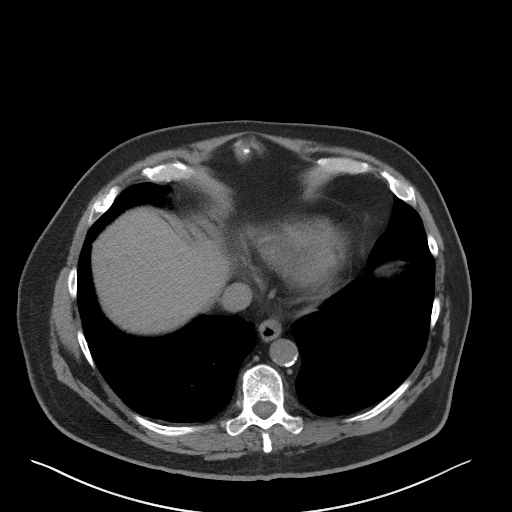
[im 91/95  soft-tissue]
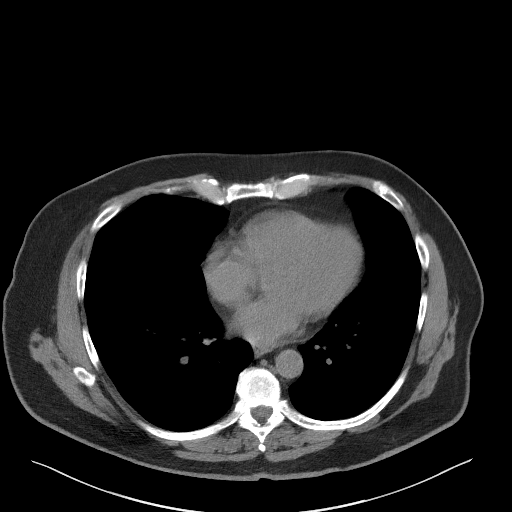

[Series 5: coronal · coronal · 0.82mm/px · 3 of 141 slices shown]
[im 47/141  soft-tissue]
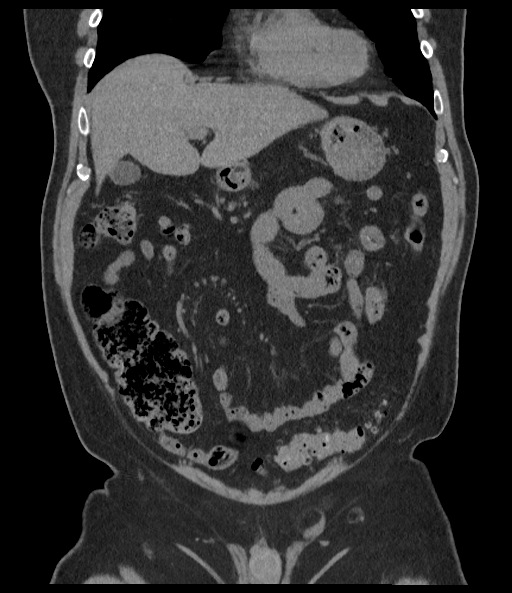
[im 63/141  soft-tissue]
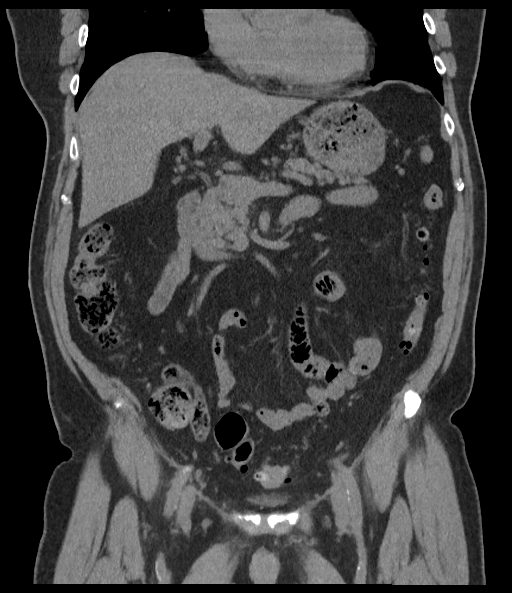
[im 78/141  soft-tissue]
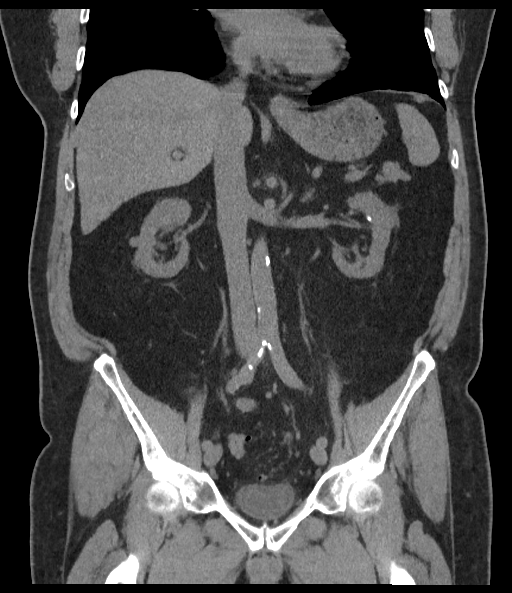

[17 of 46 positions shown; findings below may reference images not displayed]

FINDINGS: Lower chest: Lung bases are clear. No effusions. Heart is normal
size.

Hepatobiliary: Scattered hypodensities in the liver, most compatible
with cysts. Gallbladder unremarkable.

Pancreas: No focal abnormality or ductal dilatation.

Spleen: No focal abnormality.  Normal size.

Adrenals/Urinary Tract: Nonobstructing stone in the midpole of the
left kidney. No hydronephrosis. No ureteral stones. Urinary bladder
and adrenal glands unremarkable.

Stomach/Bowel: Sigmoid and descending colonic diverticulosis. No
active diverticulitis. Stomach and small bowel decompressed,
unremarkable.

Vascular/Lymphatic: Aortic atherosclerosis. No enlarged abdominal or
pelvic lymph nodes.

Reproductive: Mild prostate enlargement.

Other: Bilateral inguinal hernias containing fat. No free fluid or
free air.

Musculoskeletal: No acute bony abnormality.
IMPRESSION: Left nephrolithiasis.  No ureteral stones or hydronephrosis.

Left colonic diverticulosis.

Aortic atherosclerosis.

Prostate enlargement.

Bilateral inguinal hernias containing fat.

## 2020-10-31 DIAGNOSIS — C44612 Basal cell carcinoma of skin of right upper limb, including shoulder: Secondary | ICD-10-CM | POA: Diagnosis not present

## 2020-11-16 DIAGNOSIS — J00 Acute nasopharyngitis [common cold]: Secondary | ICD-10-CM | POA: Diagnosis not present

## 2020-11-24 DIAGNOSIS — N2581 Secondary hyperparathyroidism of renal origin: Secondary | ICD-10-CM | POA: Diagnosis not present

## 2020-11-24 DIAGNOSIS — N184 Chronic kidney disease, stage 4 (severe): Secondary | ICD-10-CM | POA: Diagnosis not present

## 2020-11-24 DIAGNOSIS — I129 Hypertensive chronic kidney disease with stage 1 through stage 4 chronic kidney disease, or unspecified chronic kidney disease: Secondary | ICD-10-CM | POA: Diagnosis not present

## 2020-11-24 DIAGNOSIS — D631 Anemia in chronic kidney disease: Secondary | ICD-10-CM | POA: Diagnosis not present

## 2020-11-24 DIAGNOSIS — R809 Proteinuria, unspecified: Secondary | ICD-10-CM | POA: Diagnosis not present

## 2020-11-24 DIAGNOSIS — N052 Unspecified nephritic syndrome with diffuse membranous glomerulonephritis: Secondary | ICD-10-CM | POA: Diagnosis not present

## 2020-11-30 DIAGNOSIS — N62 Hypertrophy of breast: Secondary | ICD-10-CM | POA: Diagnosis not present

## 2020-11-30 DIAGNOSIS — N6320 Unspecified lump in the left breast, unspecified quadrant: Secondary | ICD-10-CM | POA: Diagnosis not present

## 2020-12-01 ENCOUNTER — Other Ambulatory Visit: Payer: Self-pay | Admitting: Internal Medicine

## 2020-12-01 DIAGNOSIS — N6320 Unspecified lump in the left breast, unspecified quadrant: Secondary | ICD-10-CM

## 2020-12-01 DIAGNOSIS — N62 Hypertrophy of breast: Secondary | ICD-10-CM

## 2020-12-06 ENCOUNTER — Other Ambulatory Visit: Payer: Self-pay

## 2020-12-06 ENCOUNTER — Ambulatory Visit
Admission: RE | Admit: 2020-12-06 | Discharge: 2020-12-06 | Disposition: A | Discharge status: Home / Self Care | Payer: PPO | Source: Ambulatory Visit | Attending: Internal Medicine | Admitting: Internal Medicine

## 2020-12-06 DIAGNOSIS — N6320 Unspecified lump in the left breast, unspecified quadrant: Secondary | ICD-10-CM

## 2020-12-06 DIAGNOSIS — N62 Hypertrophy of breast: Secondary | ICD-10-CM

## 2020-12-06 DIAGNOSIS — R922 Inconclusive mammogram: Secondary | ICD-10-CM | POA: Diagnosis not present

## 2020-12-06 DIAGNOSIS — N644 Mastodynia: Secondary | ICD-10-CM | POA: Diagnosis not present

## 2020-12-14 DIAGNOSIS — K219 Gastro-esophageal reflux disease without esophagitis: Secondary | ICD-10-CM | POA: Diagnosis not present

## 2020-12-14 DIAGNOSIS — N052 Unspecified nephritic syndrome with diffuse membranous glomerulonephritis: Secondary | ICD-10-CM | POA: Diagnosis not present

## 2020-12-14 DIAGNOSIS — D631 Anemia in chronic kidney disease: Secondary | ICD-10-CM | POA: Diagnosis not present

## 2020-12-14 DIAGNOSIS — N184 Chronic kidney disease, stage 4 (severe): Secondary | ICD-10-CM | POA: Diagnosis not present

## 2020-12-14 DIAGNOSIS — I129 Hypertensive chronic kidney disease with stage 1 through stage 4 chronic kidney disease, or unspecified chronic kidney disease: Secondary | ICD-10-CM | POA: Diagnosis not present

## 2020-12-14 DIAGNOSIS — N2581 Secondary hyperparathyroidism of renal origin: Secondary | ICD-10-CM | POA: Diagnosis not present

## 2020-12-14 DIAGNOSIS — R809 Proteinuria, unspecified: Secondary | ICD-10-CM | POA: Diagnosis not present

## 2020-12-19 DIAGNOSIS — D631 Anemia in chronic kidney disease: Secondary | ICD-10-CM | POA: Diagnosis not present

## 2020-12-19 DIAGNOSIS — N2581 Secondary hyperparathyroidism of renal origin: Secondary | ICD-10-CM | POA: Diagnosis not present

## 2020-12-19 DIAGNOSIS — N184 Chronic kidney disease, stage 4 (severe): Secondary | ICD-10-CM | POA: Diagnosis not present

## 2020-12-19 DIAGNOSIS — N052 Unspecified nephritic syndrome with diffuse membranous glomerulonephritis: Secondary | ICD-10-CM | POA: Diagnosis not present

## 2020-12-19 DIAGNOSIS — I129 Hypertensive chronic kidney disease with stage 1 through stage 4 chronic kidney disease, or unspecified chronic kidney disease: Secondary | ICD-10-CM | POA: Diagnosis not present

## 2020-12-19 DIAGNOSIS — R809 Proteinuria, unspecified: Secondary | ICD-10-CM | POA: Diagnosis not present

## 2020-12-20 DIAGNOSIS — N401 Enlarged prostate with lower urinary tract symptoms: Secondary | ICD-10-CM | POA: Diagnosis not present

## 2020-12-22 DIAGNOSIS — R0602 Shortness of breath: Secondary | ICD-10-CM | POA: Diagnosis not present

## 2020-12-22 DIAGNOSIS — J9801 Acute bronchospasm: Secondary | ICD-10-CM | POA: Diagnosis not present

## 2021-01-11 DIAGNOSIS — R0602 Shortness of breath: Secondary | ICD-10-CM | POA: Diagnosis not present

## 2021-01-11 DIAGNOSIS — R0600 Dyspnea, unspecified: Secondary | ICD-10-CM | POA: Diagnosis not present

## 2021-01-11 DIAGNOSIS — E663 Overweight: Secondary | ICD-10-CM | POA: Diagnosis not present

## 2021-01-11 DIAGNOSIS — K219 Gastro-esophageal reflux disease without esophagitis: Secondary | ICD-10-CM | POA: Diagnosis not present

## 2021-01-11 DIAGNOSIS — J310 Chronic rhinitis: Secondary | ICD-10-CM | POA: Diagnosis not present

## 2021-01-24 DIAGNOSIS — N052 Unspecified nephritic syndrome with diffuse membranous glomerulonephritis: Secondary | ICD-10-CM | POA: Diagnosis not present

## 2021-01-24 DIAGNOSIS — R809 Proteinuria, unspecified: Secondary | ICD-10-CM | POA: Diagnosis not present

## 2021-01-24 DIAGNOSIS — N184 Chronic kidney disease, stage 4 (severe): Secondary | ICD-10-CM | POA: Diagnosis not present

## 2021-01-24 DIAGNOSIS — N2581 Secondary hyperparathyroidism of renal origin: Secondary | ICD-10-CM | POA: Diagnosis not present

## 2021-01-24 DIAGNOSIS — I129 Hypertensive chronic kidney disease with stage 1 through stage 4 chronic kidney disease, or unspecified chronic kidney disease: Secondary | ICD-10-CM | POA: Diagnosis not present

## 2021-01-24 DIAGNOSIS — D631 Anemia in chronic kidney disease: Secondary | ICD-10-CM | POA: Diagnosis not present

## 2021-01-29 DIAGNOSIS — I700 Atherosclerosis of aorta: Secondary | ICD-10-CM | POA: Diagnosis not present

## 2021-01-29 DIAGNOSIS — E7800 Pure hypercholesterolemia, unspecified: Secondary | ICD-10-CM | POA: Diagnosis not present

## 2021-01-29 DIAGNOSIS — I129 Hypertensive chronic kidney disease with stage 1 through stage 4 chronic kidney disease, or unspecified chronic kidney disease: Secondary | ICD-10-CM | POA: Diagnosis not present

## 2021-01-29 DIAGNOSIS — E039 Hypothyroidism, unspecified: Secondary | ICD-10-CM | POA: Diagnosis not present

## 2021-01-29 DIAGNOSIS — R7309 Other abnormal glucose: Secondary | ICD-10-CM | POA: Diagnosis not present

## 2021-01-29 DIAGNOSIS — N184 Chronic kidney disease, stage 4 (severe): Secondary | ICD-10-CM | POA: Diagnosis not present

## 2021-01-31 DIAGNOSIS — I129 Hypertensive chronic kidney disease with stage 1 through stage 4 chronic kidney disease, or unspecified chronic kidney disease: Secondary | ICD-10-CM | POA: Diagnosis not present

## 2021-01-31 DIAGNOSIS — R809 Proteinuria, unspecified: Secondary | ICD-10-CM | POA: Diagnosis not present

## 2021-01-31 DIAGNOSIS — N2581 Secondary hyperparathyroidism of renal origin: Secondary | ICD-10-CM | POA: Diagnosis not present

## 2021-01-31 DIAGNOSIS — N052 Unspecified nephritic syndrome with diffuse membranous glomerulonephritis: Secondary | ICD-10-CM | POA: Diagnosis not present

## 2021-01-31 DIAGNOSIS — D631 Anemia in chronic kidney disease: Secondary | ICD-10-CM | POA: Diagnosis not present

## 2021-01-31 DIAGNOSIS — N184 Chronic kidney disease, stage 4 (severe): Secondary | ICD-10-CM | POA: Diagnosis not present

## 2021-02-05 DIAGNOSIS — E118 Type 2 diabetes mellitus with unspecified complications: Secondary | ICD-10-CM | POA: Insufficient documentation

## 2021-02-05 DIAGNOSIS — N052 Unspecified nephritic syndrome with diffuse membranous glomerulonephritis: Secondary | ICD-10-CM | POA: Diagnosis not present

## 2021-02-05 DIAGNOSIS — N184 Chronic kidney disease, stage 4 (severe): Secondary | ICD-10-CM | POA: Diagnosis not present

## 2021-02-05 DIAGNOSIS — I129 Hypertensive chronic kidney disease with stage 1 through stage 4 chronic kidney disease, or unspecified chronic kidney disease: Secondary | ICD-10-CM | POA: Diagnosis not present

## 2021-02-05 DIAGNOSIS — E039 Hypothyroidism, unspecified: Secondary | ICD-10-CM | POA: Diagnosis not present

## 2021-02-05 DIAGNOSIS — G7000 Myasthenia gravis without (acute) exacerbation: Secondary | ICD-10-CM | POA: Diagnosis not present

## 2021-02-05 DIAGNOSIS — I700 Atherosclerosis of aorta: Secondary | ICD-10-CM | POA: Diagnosis not present

## 2021-02-08 ENCOUNTER — Ambulatory Visit: Payer: PPO | Admitting: Radiation Oncology

## 2021-02-14 DIAGNOSIS — D485 Neoplasm of uncertain behavior of skin: Secondary | ICD-10-CM | POA: Diagnosis not present

## 2021-02-14 DIAGNOSIS — Z85820 Personal history of malignant melanoma of skin: Secondary | ICD-10-CM | POA: Diagnosis not present

## 2021-02-14 DIAGNOSIS — Z85828 Personal history of other malignant neoplasm of skin: Secondary | ICD-10-CM | POA: Diagnosis not present

## 2021-02-14 DIAGNOSIS — Z08 Encounter for follow-up examination after completed treatment for malignant neoplasm: Secondary | ICD-10-CM | POA: Diagnosis not present

## 2021-02-14 DIAGNOSIS — X32XXXA Exposure to sunlight, initial encounter: Secondary | ICD-10-CM | POA: Diagnosis not present

## 2021-02-14 DIAGNOSIS — L820 Inflamed seborrheic keratosis: Secondary | ICD-10-CM | POA: Diagnosis not present

## 2021-02-14 DIAGNOSIS — D0462 Carcinoma in situ of skin of left upper limb, including shoulder: Secondary | ICD-10-CM | POA: Diagnosis not present

## 2021-02-14 DIAGNOSIS — L298 Other pruritus: Secondary | ICD-10-CM | POA: Diagnosis not present

## 2021-02-14 DIAGNOSIS — L570 Actinic keratosis: Secondary | ICD-10-CM | POA: Diagnosis not present

## 2021-02-14 DIAGNOSIS — L538 Other specified erythematous conditions: Secondary | ICD-10-CM | POA: Diagnosis not present

## 2021-02-14 DIAGNOSIS — L2089 Other atopic dermatitis: Secondary | ICD-10-CM | POA: Diagnosis not present

## 2021-02-15 ENCOUNTER — Ambulatory Visit: Payer: PPO | Admitting: Oncology

## 2021-02-15 ENCOUNTER — Other Ambulatory Visit: Payer: PPO

## 2021-02-21 DIAGNOSIS — Z01818 Encounter for other preprocedural examination: Secondary | ICD-10-CM | POA: Diagnosis not present

## 2021-02-21 DIAGNOSIS — R0609 Other forms of dyspnea: Secondary | ICD-10-CM | POA: Diagnosis not present

## 2021-02-23 ENCOUNTER — Encounter: Payer: Self-pay | Admitting: Oncology

## 2021-02-23 ENCOUNTER — Inpatient Hospital Stay (HOSPITAL_BASED_OUTPATIENT_CLINIC_OR_DEPARTMENT_OTHER): Payer: PPO | Admitting: Oncology

## 2021-02-23 ENCOUNTER — Inpatient Hospital Stay: Payer: PPO | Attending: Oncology

## 2021-02-23 VITALS — BP 118/67 | HR 91 | Temp 97.9°F | Resp 18 | Wt 235.2 lb

## 2021-02-23 DIAGNOSIS — Z8249 Family history of ischemic heart disease and other diseases of the circulatory system: Secondary | ICD-10-CM | POA: Insufficient documentation

## 2021-02-23 DIAGNOSIS — D509 Iron deficiency anemia, unspecified: Secondary | ICD-10-CM | POA: Diagnosis not present

## 2021-02-23 DIAGNOSIS — N400 Enlarged prostate without lower urinary tract symptoms: Secondary | ICD-10-CM | POA: Diagnosis not present

## 2021-02-23 DIAGNOSIS — Z923 Personal history of irradiation: Secondary | ICD-10-CM | POA: Insufficient documentation

## 2021-02-23 DIAGNOSIS — Z6834 Body mass index (BMI) 34.0-34.9, adult: Secondary | ICD-10-CM | POA: Insufficient documentation

## 2021-02-23 DIAGNOSIS — Z836 Family history of other diseases of the respiratory system: Secondary | ICD-10-CM | POA: Diagnosis not present

## 2021-02-23 DIAGNOSIS — Z8349 Family history of other endocrine, nutritional and metabolic diseases: Secondary | ICD-10-CM | POA: Diagnosis not present

## 2021-02-23 DIAGNOSIS — N184 Chronic kidney disease, stage 4 (severe): Secondary | ICD-10-CM | POA: Diagnosis not present

## 2021-02-23 DIAGNOSIS — Z87891 Personal history of nicotine dependence: Secondary | ICD-10-CM | POA: Insufficient documentation

## 2021-02-23 DIAGNOSIS — Z79899 Other long term (current) drug therapy: Secondary | ICD-10-CM | POA: Diagnosis not present

## 2021-02-23 DIAGNOSIS — K449 Diaphragmatic hernia without obstruction or gangrene: Secondary | ICD-10-CM | POA: Insufficient documentation

## 2021-02-23 DIAGNOSIS — C884 Extranodal marginal zone B-cell lymphoma of mucosa-associated lymphoid tissue [MALT-lymphoma]: Secondary | ICD-10-CM | POA: Insufficient documentation

## 2021-02-23 DIAGNOSIS — Z803 Family history of malignant neoplasm of breast: Secondary | ICD-10-CM | POA: Diagnosis not present

## 2021-02-23 DIAGNOSIS — Z85828 Personal history of other malignant neoplasm of skin: Secondary | ICD-10-CM | POA: Insufficient documentation

## 2021-02-23 DIAGNOSIS — Z87442 Personal history of urinary calculi: Secondary | ICD-10-CM | POA: Insufficient documentation

## 2021-02-23 DIAGNOSIS — I129 Hypertensive chronic kidney disease with stage 1 through stage 4 chronic kidney disease, or unspecified chronic kidney disease: Secondary | ICD-10-CM | POA: Diagnosis not present

## 2021-02-23 LAB — CBC WITH DIFFERENTIAL/PLATELET
Abs Immature Granulocytes: 0.08 10*3/uL — ABNORMAL HIGH (ref 0.00–0.07)
Basophils Absolute: 0.1 10*3/uL (ref 0.0–0.1)
Basophils Relative: 1 %
Eosinophils Absolute: 0.3 10*3/uL (ref 0.0–0.5)
Eosinophils Relative: 6 %
HCT: 51 % (ref 39.0–52.0)
Hemoglobin: 17.2 g/dL — ABNORMAL HIGH (ref 13.0–17.0)
Immature Granulocytes: 1 %
Lymphocytes Relative: 17 %
Lymphs Abs: 1 10*3/uL (ref 0.7–4.0)
MCH: 32.8 pg (ref 26.0–34.0)
MCHC: 33.7 g/dL (ref 30.0–36.0)
MCV: 97.1 fL (ref 80.0–100.0)
Monocytes Absolute: 0.7 10*3/uL (ref 0.1–1.0)
Monocytes Relative: 12 %
Neutro Abs: 3.6 10*3/uL (ref 1.7–7.7)
Neutrophils Relative %: 63 %
Platelets: 148 10*3/uL — ABNORMAL LOW (ref 150–400)
RBC: 5.25 MIL/uL (ref 4.22–5.81)
RDW: 14.2 % (ref 11.5–15.5)
WBC: 5.8 10*3/uL (ref 4.0–10.5)
nRBC: 0 % (ref 0.0–0.2)

## 2021-02-23 LAB — COMPREHENSIVE METABOLIC PANEL
ALT: 54 U/L — ABNORMAL HIGH (ref 0–44)
AST: 49 U/L — ABNORMAL HIGH (ref 15–41)
Albumin: 3.9 g/dL (ref 3.5–5.0)
Alkaline Phosphatase: 88 U/L (ref 38–126)
Anion gap: 10 (ref 5–15)
BUN: 29 mg/dL — ABNORMAL HIGH (ref 8–23)
CO2: 23 mmol/L (ref 22–32)
Calcium: 9.1 mg/dL (ref 8.9–10.3)
Chloride: 105 mmol/L (ref 98–111)
Creatinine, Ser: 3.01 mg/dL — ABNORMAL HIGH (ref 0.61–1.24)
GFR, Estimated: 21 mL/min — ABNORMAL LOW (ref >60)
Glucose, Bld: 143 mg/dL — ABNORMAL HIGH (ref 70–99)
Potassium: 4.1 mmol/L (ref 3.5–5.1)
Sodium: 138 mmol/L (ref 135–145)
Total Bilirubin: 0.6 mg/dL (ref 0.3–1.2)
Total Protein: 7.2 g/dL (ref 6.5–8.1)

## 2021-02-23 LAB — IRON AND TIBC
Iron: 71 ug/dL (ref 45–182)
Saturation Ratios: 23 % (ref 17.9–39.5)
TIBC: 311 ug/dL (ref 250–450)
UIBC: 240 ug/dL

## 2021-02-23 LAB — LACTATE DEHYDROGENASE: LDH: 193 U/L — ABNORMAL HIGH (ref 98–192)

## 2021-02-23 LAB — FERRITIN: Ferritin: 35 ng/mL (ref 24–336)

## 2021-02-26 NOTE — Progress Notes (Signed)
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  2.4 cm hypoattenuating renal lesion consistent with a cyst he underwent EGD which showed widely patent Schatzki's ring in the distal esophagus.  Small hiatal hernia.  Any area of abnormal mucosa with multifocal areas of ulceration with heaped up edges as well as multiple pigmented spots within the ulcers found in the anterior 1/[place] of the stomach.  It appears that there is any mass-effect/extrinsic compression of the stomach versus primary gastric malignancy.  No active bleeding as of recent bleeding.  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 Ki67 5%. No rearrangement of MALT 1 was observed H. pylori immunostain negative.    Repeat stool antigen H. pylori testing was also negative.he received EBRT to the involved area.  Repeat endoscopy showed no evidence of lymphoma on EGD    Interval history-patient is doing well overall.  He is concerned about his kidney functions but otherwise denies any reflux issues, denies any dark melanotic  stools  ECOG PS- 1 Pain scale- 0   Review of systems- Review of Systems  Constitutional:  Negative for chills, fever, malaise/fatigue and weight loss.  HENT:  Negative for congestion, ear discharge and nosebleeds.   Eyes:  Negative for blurred vision.  Respiratory:  Negative for cough, hemoptysis, sputum production, shortness of breath and wheezing.   Cardiovascular:  Negative for chest pain, palpitations, orthopnea and claudication.  Gastrointestinal:  Negative for abdominal pain, blood in stool, constipation, diarrhea, heartburn, melena, nausea and vomiting.  Genitourinary:  Negative for dysuria, flank pain, frequency, hematuria and urgency.  Musculoskeletal:  Negative for back pain, joint pain and myalgias.  Skin:  Negative for rash.  Neurological:  Negative for dizziness, tingling, focal weakness, seizures, weakness and headaches.  Endo/Heme/Allergies:  Does not bruise/bleed easily.  Psychiatric/Behavioral:  Negative for depression and suicidal ideas. The patient does not have insomnia.      No Known Allergies   Past Medical History:  Diagnosis Date   Basal cell carcinoma 2020   back of neck   Cancer ([place]) 1991   malignant melanoma   Chronic kidney disease    Essential hypertension    Heart murmur    History of diverticulitis 06/2014   History of kidney stones    Hyperlipidemia    MALT lymphoma ([facility])    Membranous glomerulonephritis    Myasthenia gravis ([place])    Obesity      Past Surgical History:  Procedure Laterality Date   ESOPHAGOGASTRODUODENOSCOPY (EGD) WITH PROPOFOL N/A 04/27/2020   Procedure: ESOPHAGOGASTRODUODENOSCOPY (EGD) WITH PROPOFOL;  Surgeon: Lin Landsman, MD;  Location: [facility] ENDOSCOPY;  Service: Gastroenterology;  Laterality:  N/A;   EYE SURGERY     cataract surgery done at [facility]    LITHOTRIPSY     submandibular gland removal Left     Social History   Socioeconomic History   Marital status: Married    Spouse name: Not on file   Number of  children: Not on file   Years of education: Not on file   Highest education level: High school graduate  Occupational History   Not on file  Tobacco Use   Smoking status: Former    Types: Cigarettes    Quit date: 05/14/1969    Years since quitting: 51.8   Smokeless tobacco: Never  Vaping Use   Vaping Use: Never used  Substance and Sexual Activity   Alcohol use: No   Drug use: No   Sexual activity: Not Currently  Other Topics Concern   Not on file  Social History Narrative   Not on file   Social Determinants of Health   Financial Resource Strain: Not on file  Food Insecurity: Not on file  Transportation Needs: Not on file  Physical Activity: Not on file  Stress: Not on file  Social Connections: Not on file  Intimate Partner Violence: Not on file    Family History  Problem Relation Age of Onset   Hypertension Mother    Thyroid disease Mother    Breast cancer Mother    Asthma Father      Current Outpatient Medications:    atorvastatin (LIPITOR) 80 MG tablet, Take 80 mg by mouth daily., Disp: , Rfl:    Cholecalciferol (VITAMIN D3 ULTRA STRENGTH) 125 MCG (5000 UT) capsule, Take 5,000 Units by mouth daily., Disp: , Rfl:    famotidine (PEPCID) 10 MG tablet, Take 1 tablet by mouth daily as needed., Disp: , Rfl:    gabapentin (NEURONTIN) 300 MG capsule, Take by mouth., Disp: , Rfl:    levothyroxine (SYNTHROID) 175 MCG tablet, Take 1 tablet by mouth daily., Disp: , Rfl:    losartan (COZAAR) 100 MG tablet, Take 1 tablet by mouth daily., Disp: , Rfl:    mirtazapine (REMERON) 7.5 MG tablet, Take by mouth., Disp: , Rfl:    Nutritional Supplements (THERALITH XR PO), Take by mouth 2 (two) times daily. Takes 2 tablets in AM and 2 tablets in PM, Disp: , Rfl:    pyridostigmine (MESTINON) 60 MG tablet, Take 60 mg by mouth 2 (two) times daily. , Disp: , Rfl:    albuterol (VENTOLIN HFA) 108 (90 Base) MCG/ACT inhaler, Inhale into the lungs. (Patient not taking: Reported on 02/23/2021),  Disp: , Rfl:    diltiazem (CARDIZEM CD) 120 MG 24 hr capsule, Take 120 mg by mouth daily. (Patient not taking: Reported on 02/23/2021), Disp: , Rfl:    diltiazem (TIAZAC) 120 MG 24 hr capsule, Take 1 capsule by mouth daily. (Patient not taking: Reported on 02/23/2021), Disp: , Rfl:    dutasteride (AVODART) 0.5 MG capsule, SMARTSIG:1 Capsule(s) By Mouth Every Evening (Patient not taking: Reported on 02/23/2021), Disp: , Rfl:    famotidine (PEPCID) 20 MG tablet, Take 20 mg by mouth 2 (two) times daily. (Patient not taking: Reported on 02/23/2021), Disp: , Rfl:    famotidine (PEPCID) 40 MG tablet, Take 1 tablet by mouth as needed for heartburn. (Patient not taking: Reported on 02/23/2021), Disp: , Rfl:    ferrous sulfate 325 (65 FE) MG tablet, Take 1 tablet by mouth daily with breakfast. (Patient not taking: No sig reported), Disp: , Rfl:  Omega-3 Fatty Acids (OMEGA 3 PO), Take by mouth 2 (two) times daily. (Patient not taking: Reported on 02/23/2021), Disp: , Rfl:    ondansetron (ZOFRAN) 8 MG tablet, Take 1 tablet (8 mg total) by mouth every 8 (eight) hours as needed for nausea or vomiting. (Patient not taking: No sig reported), Disp: 60 tablet, Rfl: 2   promethazine-dextromethorphan (PROMETHAZINE-DM) 6.25-15 MG/5ML syrup, Take 5 mLs by mouth every 4 (four) hours as needed. (Patient not taking: Reported on 02/23/2021), Disp: , Rfl:    Saw [place]to, Serenoa repens, 450 MG CAPS, Take 1,350 mg by mouth daily.  (Patient not taking: Reported on 02/23/2021), Disp: , Rfl:    tacrolimus (PROTOPIC) 0.1 % ointment, Apply topically 2 (two) times daily as needed. (Patient not taking: Reported on 02/23/2021), Disp: , Rfl:    tamsulosin (FLOMAX) 0.4 MG CAPS capsule, SMARTSIG:1 Capsule(s) By Mouth Every Evening (Patient not taking: Reported on 02/23/2021), Disp: , Rfl:    triamcinolone ointment (KENALOG) 0.1 %, APPLY TO THE AFFECTED AREAS ON BACK TWICE DAILY AS NEEDED (Patient not taking: Reported on 02/23/2021), Disp: , Rfl:   Physical  exam:  Vitals:   02/23/21 1421  BP: 118/67  Pulse: 91  Resp: 18  Temp: 97.9 F (36.6 C)  SpO2: 96%  Weight: 235 lb 3.2 oz (106.7 kg)   Physical Exam Constitutional:      General: He is not in acute distress. Cardiovascular:     Rate and Rhythm: Normal rate and regular rhythm.     Heart sounds: Normal heart sounds.  Pulmonary:     Effort: Pulmonary effort is normal.     Breath sounds: Normal breath sounds.  Abdominal:     General: Bowel sounds are normal.     Palpations: Abdomen is soft.  Lymphadenopathy:     Comments: No palpable cervical, supraclavicular, axillary or inguinal adenopathy    Skin:    General: Skin is warm and dry.  Neurological:     Mental Status: He is alert and oriented to person, place, and time.     CMP Latest Ref Rng & Units 02/23/2021  Glucose 70 - 99 mg/dL 143(H)  BUN 8 - 23 mg/dL 29(H)  Creatinine 0.61 - 1.24 mg/dL 3.01(H)  Sodium 135 - 145 mmol/L 138  Potassium 3.5 - 5.1 mmol/L 4.1  Chloride 98 - 111 mmol/L 105  CO2 22 - 32 mmol/L 23  Calcium 8.9 - 10.3 mg/dL 9.1  Total Protein 6.5 - 8.1 g/dL 7.2  Total Bilirubin 0.3 - 1.2 mg/dL 0.6  Alkaline Phos 38 - 126 U/L 88  AST 15 - 41 U/L 49(H)  ALT 0 - 44 U/L 54(H)   CBC Latest Ref Rng & Units 02/23/2021  WBC 4.0 - 10.5 K/uL 5.8  Hemoglobin 13.0 - 17.0 g/dL 17.2(H)  Hematocrit 39.0 - 52.0 % 51.0  Platelets 150 - 400 K/uL 148(L)     Assessment and plan- Patient is a 75 y.o. male with history of gastric MALT lymphoma H. pylori negative s/p radiation here for routine follow-up  Clinically patient is doing well with no concerning signs and symptoms of recurrence based on today's exam.  No palpable adenopathy or hepatosplenomegaly.CBC shows his hemoglobin is actually elevated at 17.2.  When he had his MALT lymphoma his hemoglobin drifted down to 12.3 but at his baseline he runs around 16.  He has CKD with a creatinine between 2.5-3.  Ferritin and iron studies are presently normal with a normal LDH.   No clear recommendations about  surveillance endoscopies in the future which are mainly guided by clinical symptoms.  I will repeat CBC with differential CMP and LDH in 3 in 6 months and see him back in 6 months.  If hemoglobin remains persistently elevated at 3 months we could consider further polycythemia work-up.  I do suspect a component of obstructive sleep apnea given his BMI is 34 as well.   Visit Diagnosis 1. 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 Iron deficiency anemia, unspecified iron deficiency anemia type      Dr. [name], MD, MPH [facility] [phone] 02/26/2021 9:53 AM

## 2021-03-12 ENCOUNTER — Ambulatory Visit: Payer: PPO | Admitting: Oncology

## 2021-03-12 ENCOUNTER — Other Ambulatory Visit: Payer: PPO

## 2021-03-28 DIAGNOSIS — D0462 Carcinoma in situ of skin of left upper limb, including shoulder: Secondary | ICD-10-CM | POA: Diagnosis not present

## 2021-03-28 DIAGNOSIS — L570 Actinic keratosis: Secondary | ICD-10-CM | POA: Diagnosis not present

## 2021-03-29 DIAGNOSIS — D631 Anemia in chronic kidney disease: Secondary | ICD-10-CM | POA: Diagnosis not present

## 2021-03-29 DIAGNOSIS — N052 Unspecified nephritic syndrome with diffuse membranous glomerulonephritis: Secondary | ICD-10-CM | POA: Diagnosis not present

## 2021-03-29 DIAGNOSIS — N184 Chronic kidney disease, stage 4 (severe): Secondary | ICD-10-CM | POA: Diagnosis not present

## 2021-03-29 DIAGNOSIS — I129 Hypertensive chronic kidney disease with stage 1 through stage 4 chronic kidney disease, or unspecified chronic kidney disease: Secondary | ICD-10-CM | POA: Diagnosis not present

## 2021-03-29 DIAGNOSIS — R809 Proteinuria, unspecified: Secondary | ICD-10-CM | POA: Diagnosis not present

## 2021-03-29 DIAGNOSIS — N2581 Secondary hyperparathyroidism of renal origin: Secondary | ICD-10-CM | POA: Diagnosis not present

## 2021-04-03 DIAGNOSIS — N052 Unspecified nephritic syndrome with diffuse membranous glomerulonephritis: Secondary | ICD-10-CM | POA: Diagnosis not present

## 2021-04-03 DIAGNOSIS — D631 Anemia in chronic kidney disease: Secondary | ICD-10-CM | POA: Diagnosis not present

## 2021-04-03 DIAGNOSIS — N2581 Secondary hyperparathyroidism of renal origin: Secondary | ICD-10-CM | POA: Diagnosis not present

## 2021-04-03 DIAGNOSIS — I129 Hypertensive chronic kidney disease with stage 1 through stage 4 chronic kidney disease, or unspecified chronic kidney disease: Secondary | ICD-10-CM | POA: Diagnosis not present

## 2021-04-03 DIAGNOSIS — R809 Proteinuria, unspecified: Secondary | ICD-10-CM | POA: Diagnosis not present

## 2021-04-03 DIAGNOSIS — N184 Chronic kidney disease, stage 4 (severe): Secondary | ICD-10-CM | POA: Diagnosis not present

## 2021-04-26 DIAGNOSIS — R809 Proteinuria, unspecified: Secondary | ICD-10-CM | POA: Diagnosis not present

## 2021-04-26 DIAGNOSIS — I129 Hypertensive chronic kidney disease with stage 1 through stage 4 chronic kidney disease, or unspecified chronic kidney disease: Secondary | ICD-10-CM | POA: Diagnosis not present

## 2021-04-26 DIAGNOSIS — N2581 Secondary hyperparathyroidism of renal origin: Secondary | ICD-10-CM | POA: Diagnosis not present

## 2021-04-26 DIAGNOSIS — N052 Unspecified nephritic syndrome with diffuse membranous glomerulonephritis: Secondary | ICD-10-CM | POA: Diagnosis not present

## 2021-04-26 DIAGNOSIS — D631 Anemia in chronic kidney disease: Secondary | ICD-10-CM | POA: Diagnosis not present

## 2021-04-26 DIAGNOSIS — N184 Chronic kidney disease, stage 4 (severe): Secondary | ICD-10-CM | POA: Diagnosis not present

## 2021-05-01 DIAGNOSIS — I129 Hypertensive chronic kidney disease with stage 1 through stage 4 chronic kidney disease, or unspecified chronic kidney disease: Secondary | ICD-10-CM | POA: Diagnosis not present

## 2021-05-01 DIAGNOSIS — R809 Proteinuria, unspecified: Secondary | ICD-10-CM | POA: Diagnosis not present

## 2021-05-01 DIAGNOSIS — N184 Chronic kidney disease, stage 4 (severe): Secondary | ICD-10-CM | POA: Diagnosis not present

## 2021-05-01 DIAGNOSIS — N2581 Secondary hyperparathyroidism of renal origin: Secondary | ICD-10-CM | POA: Diagnosis not present

## 2021-05-01 DIAGNOSIS — N052 Unspecified nephritic syndrome with diffuse membranous glomerulonephritis: Secondary | ICD-10-CM | POA: Diagnosis not present

## 2021-05-25 ENCOUNTER — Inpatient Hospital Stay: Payer: PPO | Attending: Oncology

## 2021-05-25 ENCOUNTER — Other Ambulatory Visit: Payer: Self-pay

## 2021-05-25 DIAGNOSIS — Z803 Family history of malignant neoplasm of breast: Secondary | ICD-10-CM | POA: Diagnosis not present

## 2021-05-25 DIAGNOSIS — D509 Iron deficiency anemia, unspecified: Secondary | ICD-10-CM | POA: Diagnosis not present

## 2021-05-25 DIAGNOSIS — Z8249 Family history of ischemic heart disease and other diseases of the circulatory system: Secondary | ICD-10-CM | POA: Diagnosis not present

## 2021-05-25 DIAGNOSIS — Z836 Family history of other diseases of the respiratory system: Secondary | ICD-10-CM | POA: Insufficient documentation

## 2021-05-25 DIAGNOSIS — Z87891 Personal history of nicotine dependence: Secondary | ICD-10-CM | POA: Insufficient documentation

## 2021-05-25 DIAGNOSIS — Z79899 Other long term (current) drug therapy: Secondary | ICD-10-CM | POA: Diagnosis not present

## 2021-05-25 DIAGNOSIS — C884 Extranodal marginal zone B-cell lymphoma of mucosa-associated lymphoid tissue [MALT-lymphoma]: Secondary | ICD-10-CM | POA: Diagnosis not present

## 2021-05-25 DIAGNOSIS — Z8349 Family history of other endocrine, nutritional and metabolic diseases: Secondary | ICD-10-CM | POA: Diagnosis not present

## 2021-05-25 LAB — LACTATE DEHYDROGENASE: LDH: 176 U/L (ref 98–192)

## 2021-05-25 LAB — CBC WITH DIFFERENTIAL/PLATELET
Abs Immature Granulocytes: 0.12 10*3/uL — ABNORMAL HIGH (ref 0.00–0.07)
Basophils Absolute: 0 10*3/uL (ref 0.0–0.1)
Basophils Relative: 1 %
Eosinophils Absolute: 0.2 10*3/uL (ref 0.0–0.5)
Eosinophils Relative: 3 %
HCT: 50.6 % (ref 39.0–52.0)
Hemoglobin: 16.9 g/dL (ref 13.0–17.0)
Immature Granulocytes: 2 %
Lymphocytes Relative: 14 %
Lymphs Abs: 0.9 10*3/uL (ref 0.7–4.0)
MCH: 33.5 pg (ref 26.0–34.0)
MCHC: 33.4 g/dL (ref 30.0–36.0)
MCV: 100.2 fL — ABNORMAL HIGH (ref 80.0–100.0)
Monocytes Absolute: 0.9 10*3/uL (ref 0.1–1.0)
Monocytes Relative: 14 %
Neutro Abs: 4.4 10*3/uL (ref 1.7–7.7)
Neutrophils Relative %: 66 %
Platelets: 160 10*3/uL (ref 150–400)
RBC: 5.05 MIL/uL (ref 4.22–5.81)
RDW: 14 % (ref 11.5–15.5)
WBC: 6.6 10*3/uL (ref 4.0–10.5)
nRBC: 0 % (ref 0.0–0.2)

## 2021-05-25 LAB — COMPREHENSIVE METABOLIC PANEL
ALT: 58 U/L — ABNORMAL HIGH (ref 0–44)
AST: 41 U/L (ref 15–41)
Albumin: 3.8 g/dL (ref 3.5–5.0)
Alkaline Phosphatase: 102 U/L (ref 38–126)
Anion gap: 7 (ref 5–15)
BUN: 32 mg/dL — ABNORMAL HIGH (ref 8–23)
CO2: 29 mmol/L (ref 22–32)
Calcium: 9.6 mg/dL (ref 8.9–10.3)
Chloride: 103 mmol/L (ref 98–111)
Creatinine, Ser: 3.09 mg/dL — ABNORMAL HIGH (ref 0.61–1.24)
GFR, Estimated: 20 mL/min — ABNORMAL LOW (ref >60)
Glucose, Bld: 129 mg/dL — ABNORMAL HIGH (ref 70–99)
Potassium: 4.8 mmol/L (ref 3.5–5.1)
Sodium: 139 mmol/L (ref 135–145)
Total Bilirubin: 0.5 mg/dL (ref 0.3–1.2)
Total Protein: 7.1 g/dL (ref 6.5–8.1)

## 2021-05-31 DIAGNOSIS — N184 Chronic kidney disease, stage 4 (severe): Secondary | ICD-10-CM | POA: Diagnosis not present

## 2021-05-31 DIAGNOSIS — G7000 Myasthenia gravis without (acute) exacerbation: Secondary | ICD-10-CM | POA: Diagnosis not present

## 2021-05-31 DIAGNOSIS — E039 Hypothyroidism, unspecified: Secondary | ICD-10-CM | POA: Diagnosis not present

## 2021-05-31 DIAGNOSIS — E118 Type 2 diabetes mellitus with unspecified complications: Secondary | ICD-10-CM | POA: Diagnosis not present

## 2021-05-31 DIAGNOSIS — I129 Hypertensive chronic kidney disease with stage 1 through stage 4 chronic kidney disease, or unspecified chronic kidney disease: Secondary | ICD-10-CM | POA: Diagnosis not present

## 2021-06-07 DIAGNOSIS — I129 Hypertensive chronic kidney disease with stage 1 through stage 4 chronic kidney disease, or unspecified chronic kidney disease: Secondary | ICD-10-CM | POA: Diagnosis not present

## 2021-06-07 DIAGNOSIS — G7000 Myasthenia gravis without (acute) exacerbation: Secondary | ICD-10-CM | POA: Diagnosis not present

## 2021-06-07 DIAGNOSIS — E118 Type 2 diabetes mellitus with unspecified complications: Secondary | ICD-10-CM | POA: Diagnosis not present

## 2021-06-07 DIAGNOSIS — I700 Atherosclerosis of aorta: Secondary | ICD-10-CM | POA: Diagnosis not present

## 2021-06-07 DIAGNOSIS — E039 Hypothyroidism, unspecified: Secondary | ICD-10-CM | POA: Diagnosis not present

## 2021-06-07 DIAGNOSIS — N184 Chronic kidney disease, stage 4 (severe): Secondary | ICD-10-CM | POA: Diagnosis not present

## 2021-06-24 DIAGNOSIS — E119 Type 2 diabetes mellitus without complications: Secondary | ICD-10-CM

## 2021-06-24 HISTORY — DX: Type 2 diabetes mellitus without complications: E11.9

## 2021-06-27 ENCOUNTER — Telehealth: Payer: Self-pay | Admitting: *Deleted

## 2021-06-27 NOTE — Telephone Encounter (Signed)
I called patient to inform him that Dr Janese Banks wants Dr Marius Ditch to see him first and that he should expect a call from Dr Verlin Grills office for an appointment there. He was in agreement with this

## 2021-06-27 NOTE — Telephone Encounter (Signed)
Bill Aguilar Can you please see him first? Possible EGD?

## 2021-06-27 NOTE — Telephone Encounter (Signed)
Patient called with concerns that his stomach cancer may be back. Since before Christmas, he has awakened with nausea and a gnawing feeling in his stomach that makes him have to eat right away or he gets worse.He reports that he is staying nauseated and is having a hard time getting food down due to the nausea. He has lost 6 pounds during this time frame. He denies melena in his stools and he has vomited once with no blood noted in it. His next appointment is not until March and he is asking if he "could at least have labs checked to see if my stomach cancer has come back". Please advise

## 2021-06-27 NOTE — Telephone Encounter (Signed)
Patient made appointment for 06/28/2020 at 1:30pm to see Dr. Marius Ditch

## 2021-06-28 ENCOUNTER — Other Ambulatory Visit: Payer: Self-pay

## 2021-06-28 ENCOUNTER — Ambulatory Visit (INDEPENDENT_AMBULATORY_CARE_PROVIDER_SITE_OTHER): Payer: PPO | Admitting: Gastroenterology

## 2021-06-28 ENCOUNTER — Encounter: Payer: Self-pay | Admitting: Gastroenterology

## 2021-06-28 VITALS — BP 132/75 | HR 80 | Temp 97.8°F | Ht 70.0 in | Wt 228.5 lb

## 2021-06-28 DIAGNOSIS — R1013 Epigastric pain: Secondary | ICD-10-CM | POA: Diagnosis not present

## 2021-06-28 DIAGNOSIS — L308 Other specified dermatitis: Secondary | ICD-10-CM | POA: Insufficient documentation

## 2021-06-28 DIAGNOSIS — R110 Nausea: Secondary | ICD-10-CM | POA: Diagnosis not present

## 2021-06-28 DIAGNOSIS — N042 Nephrotic syndrome with diffuse membranous glomerulonephritis: Secondary | ICD-10-CM | POA: Insufficient documentation

## 2021-06-28 MED ORDER — OMEPRAZOLE 40 MG PO CPDR: 40.0000 mg | DELAYED_RELEASE_CAPSULE | Freq: Every day | ORAL | 0 refills | Status: DC

## 2021-06-28 NOTE — Progress Notes (Addendum)
Bill Darby, MD [address]  [place]  [place],  [address]  Main: [phone]  Fax: [phone]    Gastroenterology Consultation  Referring Provider:     Kirk Ruths, MD Primary Care Physician:  Bill Ruths, MD Primary Gastroenterologist:  Bill Aguilar Reason for Consultation: Nausea        HPI:   Bill Aguilar is a 76 y.o. male referred by Dr. Ouida Aguilar, Bill Cornfield, MD  for consultation & management of chronic nausea.  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  Patient reports that for last 2 weeks, precisely before Christmas, started experiencing early morning nausea and feels like a knot in his upper stomach.  The symptoms make him to eat right away, otherwise gets worse.  He reports that he has constant nausea and even bringing food in front of his mouth makes it worse and does not have desire to eat.  He reports that his weight has been stable, denies any lack of appetite, patient denies heartburn, regurgitation, vomiting, difficulty swallowing or cough.  He denies irregular bowel habits.  He denies abdominal pain or abdominal bloating.  Patient does have history of metabolic syndrome.  No evidence of anemia, does have history of stage 4 CKD  Patient also states that he started taking supplements to lower his blood sugar 2 weeks ago.  He did not take his supplement this morning thinking that if it is causing his symptoms because this is the only change he has made within last 2 weeks  NSAIDs: None  Antiplts/Anticoagulants/Anti thrombotics: None  GI Procedures:  Upper endoscopy 04/27/2020 - Normal duodenal bulb and second portion of the duodenum. - Normal stomach. Biopsied. - Salmon-colored mucosa suspicious for short-segment Barrett's esophagus. Biopsied. - Esophagogastric landmarks identified. - Normal esophagus. DIAGNOSIS:  A.  STOMACH, INCISURA; COLD BIOPSY:  - UNREMARKABLE GASTRIC MUCOSA.  - NEGATIVE FOR H. PYLORI, DYSPLASIA, AND MALIGNANCY.   B.  STOMACH, ANTRUM LESSER CURVATURE; COLD BIOPSY:  - UNREMARKABLE GASTRIC MUCOSA.  - NEGATIVE FOR H. PYLORI, DYSPLASIA, AND MALIGNANCY.   C.  STOMACH, ANTRUM GREATER CURVATURE; COLD BIOPSY:  - UNREMARKABLE GASTRIC MUCOSA.  - NEGATIVE FOR H. PYLORI, DYSPLASIA, AND MALIGNANCY.   D.  STOMACH, BODY GREATER CURVATURE; COLD BIOPSY:  - UNREMARKABLE GASTRIC MUCOSA.  - NEGATIVE FOR H. PYLORI, DYSPLASIA, AND MALIGNANCY.   E.  STOMACH, BODY LESSER CURVATURE; COLD BIOPSY:  - MILD CHRONIC AND FOCAL MINIMAL ACTIVE GASTRITIS.  - IHC FOR H. PYLORI WILL BE REPORTED IN AN ADDENDUM.  - NEGATIVE FOR DYSPLASIA AND MALIGNANCY.   F.  GEJ; COLD BIOPSY:  - SINGLE FRAGMENT OF MILDLY EDEMATOUS GASTRIC CARDIAC TYPE MUCOSA WITH  MILD NON-SPECIFIC CHRONIC GASTRITIS, COMPATIBLE WITH REFLUX IN THE  APPROPRIATE SETTING.  - SQUAMOUS MUCOSA IS NOT PRESENT FOR EVALUATION.  - NEGATIVE FOR INTESTINAL METAPLASIA, H. PYLORI, DYSPLASIA, AND  MALIGNANCY.   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 Residual lymphoma is not identified.   Past Medical History:  Diagnosis Date   Basal cell carcinoma 2020   back of neck   Cancer ([place]) 1991   malignant melanoma   Chronic kidney disease    Essential hypertension    Heart murmur    History of diverticulitis 06/2014   History of kidney stones    Hyperlipidemia  MALT lymphoma ([facility])    Membranous glomerulonephritis    Myasthenia gravis ([place])    Obesity     Past Surgical History:  Procedure Laterality Date   ESOPHAGOGASTRODUODENOSCOPY (EGD) WITH PROPOFOL N/A 04/27/2020   Procedure: ESOPHAGOGASTRODUODENOSCOPY (EGD) WITH PROPOFOL;  Surgeon: Bill Landsman, MD;  Location: [facility] ENDOSCOPY;  Service: Gastroenterology;  Laterality: N/A;   EYE SURGERY     cataract surgery done at [facility]     LITHOTRIPSY     submandibular gland removal Left    Current Outpatient Medications:    albuterol (VENTOLIN HFA) 108 (90 Base) MCG/ACT inhaler, Inhale into the lungs., Disp: , Rfl:    atorvastatin (LIPITOR) 80 MG tablet, TAKE ONE-HALF TABLET BY MOUTH TWO TIMES A DAY, Disp: , Rfl:    Cholecalciferol (VITAMIN D3 ULTRA STRENGTH) 125 MCG (5000 UT) capsule, Take 5,000 Units by mouth daily., Disp: , Rfl:    diltiazem (CARDIZEM SR) 120 MG 12 hr capsule, TAKE 1 CAPSULE BY MOUTH EVERY 24 HOURS, Disp: , Rfl:    dutasteride (AVODART) 0.5 MG capsule, TAKE 1 CAPSULE BY MOUTH EVERY 24 HOURS, Disp: , Rfl:    empagliflozin (JARDIANCE) 25 MG TABS tablet, Take 1 tablet by mouth daily., Disp: , Rfl:    famotidine (PEPCID) 40 MG tablet, TAKE ONE TABLET BY MOUTH  PRN, Disp: , Rfl:    ferrous sulfate 325 (65 FE) MG tablet, Take 1 tablet by mouth daily with breakfast., Disp: , Rfl:    gabapentin (NEURONTIN) 300 MG capsule, Take by mouth., Disp: , Rfl:    levothyroxine (SYNTHROID) 88 MCG tablet, Take 2 tablets by mouth daily., Disp: , Rfl:    losartan (COZAAR) 100 MG tablet, Take by mouth., Disp: , Rfl:    Nutritional Supplements (THERALITH XR PO), Take by mouth 2 (two) times daily. Takes 2 tablets in AM and 2 tablets in PM, Disp: , Rfl:    Omega-3 Fatty Acids (OMEGA 3 PO), Take by mouth 2 (two) times daily., Disp: , Rfl:    omeprazole (PRILOSEC) 40 MG capsule, Take 1 capsule (40 mg total) by mouth daily before breakfast., Disp: 30 capsule, Rfl: 0   ondansetron (ZOFRAN) 8 MG tablet, Take 1 tablet (8 mg total) by mouth every 8 (eight) hours as needed for nausea or vomiting., Disp: 60 tablet, Rfl: 2   promethazine-dextromethorphan (PROMETHAZINE-DM) 6.25-15 MG/5ML syrup, Take 5 mLs by mouth every 4 (four) hours as needed., Disp: , Rfl:    pyridostigmine (MESTINON) 60 MG tablet, Take 1 tablet by mouth 2 (two) times daily., Disp: , Rfl:    Saw Palmetto, Serenoa repens, 450 MG CAPS, Take 1,350 mg by mouth daily., Disp: ,  Rfl:    tacrolimus (PROTOPIC) 0.1 % ointment, Apply topically 2 (two) times daily as needed., Disp: , Rfl:    tamsulosin (FLOMAX) 0.4 MG CAPS capsule, , Disp: , Rfl:    triamcinolone cream (KENALOG) 0.1 %, APPLY SMALL AMOUNT TOPICALLY TWO TIMES A DAY AS NEEDED FOR RED RAISED ITCHY RASH. STOP WHEN SMOOTH. AVOID FACE, ARMPIT, AND GROIN, Disp: , Rfl:     Family History  Problem Relation Age of Onset   Hypertension Mother    Thyroid disease Mother    Breast cancer Mother    Asthma Father      Social History   Tobacco Use   Smoking status: Former    Types: Cigarettes    Quit date: 05/14/1969    Years since quitting: 52.1   Smokeless tobacco: Never  Vaping Use  Vaping Use: Never used  Substance Use Topics   Alcohol use: No   Drug use: No    Allergies as of 06/28/2021   (No Known Allergies)    Review of Systems:    All systems reviewed and negative except where noted in HPI.   Physical Exam:  BP 132/75 (BP Location: Left Arm, Patient Position: Sitting, Cuff Size: Normal)    Pulse 80    Temp 97.8 F (36.6 C) (Oral)    Ht 5\' 10"  (1.778 m)    Wt 228 lb 8 oz (103.6 kg)    BMI 32.79 kg/m  No LMP for male patient.  General:   Alert,  Well-developed, well-nourished, pleasant and cooperative in NAD Head:  Normocephalic and atraumatic. Eyes:  Sclera clear, no icterus.   Conjunctiva pink. Ears:  Normal auditory acuity. Nose:  No deformity, discharge, or lesions. Mouth:  No deformity or lesions,oropharynx pink & moist. Neck:  Supple; no masses or thyromegaly. Lungs:  Respirations even and unlabored.  Clear throughout to auscultation.   No wheezes, crackles, or rhonchi. No acute distress. Heart:  Regular rate and rhythm; no murmurs, clicks, rubs, or gallops. Abdomen:  Normal bowel sounds. Soft, obese, non-tender and non-distended without masses, hepatosplenomegaly or hernias noted.  No guarding or rebound tenderness.   Rectal: Not performed Msk:  Symmetrical without gross  deformities. Good, equal movement & strength bilaterally. Pulses:  Normal pulses noted. Extremities:  No clubbing or edema.  No cyanosis. Neurologic:  Alert and oriented x3;  grossly normal neurologically. Skin:  Intact without significant lesions or rashes. No jaundice. Psych:  Alert and cooperative. Normal mood and affect.  Imaging Studies: Reviewed, whole-body PET scan on 04/12/2020 came back unremarkable except for fatty liver  Assessment and Plan:   Bill Aguilar is a 76 y.o. Caucasian male with metabolic syndrome, stage IV CKD, history of H. pylori negative mild lymphoma s/p radiation with no recurrence of disease as of 04/2020 is seen in consultation for 2 weeks history of early morning nause Patient has diabetes, last hemoglobin A1c 7.2.  Reported history of herbal supplements to lower his blood sugar, started 2 weeks ago.  Likely, his symptoms are related to supplements if these are causing hypoglycemia  Recommend to stop the supplements Recommend H. pylori breath test today Empiric trial of omeprazole 40 mg daily for 2 to 4 weeks if symptoms do not resolve despite stopping the supplements If above work-up is negative and symptoms are still persistent, recommend EGD +/- CT abdomen with contrast Other differential could be gastroparesis   Follow up in 2 to 3 weeks or contact via phone   Bill Darby, MD

## 2021-06-29 LAB — H. PYLORI BREATH TEST: H pylori Breath Test: NEGATIVE

## 2021-07-02 ENCOUNTER — Telehealth: Payer: Self-pay

## 2021-07-02 NOTE — Telephone Encounter (Signed)
Patient verbalized understanding results.  he states he is taking the omeprazole 30 minutes before breakfast. He states the nausea is getting better he is still having some nausea but it is a lot improved then it has been

## 2021-07-02 NOTE — Telephone Encounter (Signed)
-----   Message from Lin Landsman, MD sent at 07/02/2021 12:28 PM EST ----- Please inform patient that his H. pylori test came back negative.  Please check with him if his nausea has improved since stopping the supplements  RV

## 2021-07-23 ENCOUNTER — Encounter: Payer: Self-pay | Admitting: Gastroenterology

## 2021-07-23 ENCOUNTER — Other Ambulatory Visit: Payer: Self-pay

## 2021-07-23 ENCOUNTER — Ambulatory Visit (INDEPENDENT_AMBULATORY_CARE_PROVIDER_SITE_OTHER): Payer: PPO | Admitting: Gastroenterology

## 2021-07-23 VITALS — BP 145/70 | HR 93 | Temp 97.7°F | Ht 70.0 in | Wt 230.4 lb

## 2021-07-23 DIAGNOSIS — R110 Nausea: Secondary | ICD-10-CM | POA: Diagnosis not present

## 2021-07-23 MED ORDER — ONDANSETRON HCL 8 MG PO TABS: 8.0000 mg | ORAL_TABLET | Freq: Three times a day (TID) | ORAL | 1 refills | Status: AC | PRN

## 2021-07-23 NOTE — Progress Notes (Signed)
[name], MD [address]  [place]  [place], [place] [address]  Main: [phone]  Fax: [phone]    Gastroenterology Consultation  Referring Provider:     Kirk Ruths, MD Primary Care Physician:  Kirk Ruths, MD Primary Gastroenterologist:  Dr. [name] Reason for Consultation: Nausea        HPI:   Bill Aguilar is a 76 y.o. male referred by Dr. Ouida Sills, Ocie Cornfield, MD  for consultation & management of chronic nausea.  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  Patient reports that for last 2 weeks, precisely before Christmas, started experiencing early morning nausea and feels like a knot in his upper stomach.  The symptoms make him to eat right away, otherwise gets worse.  He reports that he has constant nausea and even bringing food in front of his mouth makes it worse and does not have desire to eat.  He reports that his weight has been stable, denies any lack of appetite, patient denies heartburn, regurgitation, vomiting, difficulty swallowing or cough.  He denies irregular bowel habits.  He denies abdominal pain or abdominal bloating.  Patient does have history of metabolic syndrome.  No evidence of anemia, does have history of stage 4 CKD  Patient also states that he started taking supplements to lower his blood sugar 2 weeks ago.  He did not take his supplement this morning thinking that if it is causing his symptoms because this is the only change he has made within last 2 weeks  Follow-up visit 07/23/2021 Patient reports that his nausea has resolved.  He stopped taking supplements.  He did have early morning nausea for 2 days and he took Zofran 8 mg prescribed by Dr. Berton Mount which significantly helped.  He is no longer experiencing nausea.  This was right after he saw me.  He is requesting prescription for Zofran in case his  nausea recurs  NSAIDs: None  Antiplts/Anticoagulants/Anti thrombotics: None  GI Procedures:  Upper endoscopy 04/27/2020 - Normal duodenal bulb and second portion of the duodenum. - Normal stomach. Biopsied. - Salmon-colored mucosa suspicious for short-segment Barrett's esophagus. Biopsied. - Esophagogastric landmarks identified. - Normal esophagus. DIAGNOSIS:  A. STOMACH, INCISURA; COLD BIOPSY:  - UNREMARKABLE GASTRIC MUCOSA.  - NEGATIVE FOR H. PYLORI, DYSPLASIA, AND MALIGNANCY.   B.  STOMACH, ANTRUM LESSER CURVATURE; COLD BIOPSY:  - UNREMARKABLE GASTRIC MUCOSA.  - NEGATIVE FOR H. PYLORI, DYSPLASIA, AND MALIGNANCY.   C.  STOMACH, ANTRUM GREATER CURVATURE; COLD BIOPSY:  - UNREMARKABLE GASTRIC MUCOSA.  - NEGATIVE FOR H. PYLORI, DYSPLASIA, AND MALIGNANCY.   D.  STOMACH, BODY GREATER CURVATURE; COLD BIOPSY:  - UNREMARKABLE GASTRIC MUCOSA.  - NEGATIVE FOR H. PYLORI, DYSPLASIA, AND MALIGNANCY.   E.  STOMACH, BODY LESSER CURVATURE; COLD BIOPSY:  - MILD CHRONIC AND FOCAL MINIMAL ACTIVE GASTRITIS.  - IHC FOR H. PYLORI WILL BE REPORTED IN AN ADDENDUM.  - NEGATIVE FOR DYSPLASIA AND MALIGNANCY.   F.  GEJ; COLD BIOPSY:  - SINGLE FRAGMENT OF MILDLY EDEMATOUS GASTRIC CARDIAC TYPE MUCOSA WITH  MILD NON-SPECIFIC CHRONIC GASTRITIS, COMPATIBLE WITH REFLUX IN THE  APPROPRIATE SETTING.  - SQUAMOUS MUCOSA IS NOT PRESENT FOR EVALUATION.  - NEGATIVE FOR INTESTINAL METAPLASIA, H. PYLORI, DYSPLASIA, AND  MALIGNANCY.   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 Residual  lymphoma is not identified.   Past Medical History:  Diagnosis Date   Basal cell carcinoma 2020   back of neck   Cancer ([place]) 1991   malignant melanoma   Chronic kidney disease    Essential hypertension    Heart murmur    History of diverticulitis 06/2014   History of kidney stones    Hyperlipidemia    MALT lymphoma ([facility])    Membranous glomerulonephritis     Myasthenia gravis ([place])    Obesity     Past Surgical History:  Procedure Laterality Date   ESOPHAGOGASTRODUODENOSCOPY (EGD) WITH PROPOFOL N/A 04/27/2020   Procedure: ESOPHAGOGASTRODUODENOSCOPY (EGD) WITH PROPOFOL;  Surgeon: Lin Landsman, MD;  Location: [facility] ENDOSCOPY;  Service: Gastroenterology;  Laterality: N/A;   EYE SURGERY     cataract surgery done at [facility]    LITHOTRIPSY     submandibular gland removal Left    Current Outpatient Medications:    albuterol (VENTOLIN HFA) 108 (90 Base) MCG/ACT inhaler, Inhale into the lungs., Disp: , Rfl:    atorvastatin (LIPITOR) 80 MG tablet, TAKE ONE-HALF TABLET BY MOUTH TWO TIMES A DAY, Disp: , Rfl:    Cholecalciferol (VITAMIN D3 ULTRA STRENGTH) 125 MCG (5000 UT) capsule, Take 5,000 Units by mouth daily., Disp: , Rfl:    diltiazem (CARDIZEM SR) 120 MG 12 hr capsule, TAKE 1 CAPSULE BY MOUTH EVERY 24 HOURS, Disp: , Rfl:    dutasteride (AVODART) 0.5 MG capsule, TAKE 1 CAPSULE BY MOUTH EVERY 24 HOURS, Disp: , Rfl:    empagliflozin (JARDIANCE) 25 MG TABS tablet, Take 1 tablet by mouth daily., Disp: , Rfl:    famotidine (PEPCID) 40 MG tablet, TAKE ONE TABLET BY MOUTH  PRN, Disp: , Rfl:    ferrous sulfate 325 (65 FE) MG tablet, Take 1 tablet by mouth daily with breakfast., Disp: , Rfl:    gabapentin (NEURONTIN) 300 MG capsule, Take by mouth., Disp: , Rfl:    levothyroxine (SYNTHROID) 175 MCG tablet, Take 175 mcg by mouth daily., Disp: , Rfl:    losartan (COZAAR) 100 MG tablet, Take by mouth., Disp: , Rfl:    Nutritional Supplements (THERALITH XR PO), Take by mouth 2 (two) times daily. Takes 2 tablets in AM and 2 tablets in PM, Disp: , Rfl:    Omega-3 Fatty Acids (OMEGA 3 PO), Take by mouth 2 (two) times daily., Disp: , Rfl:    omeprazole (PRILOSEC) 40 MG capsule, Take 1 capsule (40 mg total) by mouth daily before breakfast., Disp: 30 capsule, Rfl: 0   promethazine-dextromethorphan (PROMETHAZINE-DM) 6.25-15 MG/5ML syrup, Take 5 mLs by mouth every 4  (four) hours as needed., Disp: , Rfl:    pyridostigmine (MESTINON) 60 MG tablet, Take 1 tablet by mouth 2 (two) times daily., Disp: , Rfl:    Saw Palmetto, Serenoa repens, 450 MG CAPS, Take 1,350 mg by mouth daily., Disp: , Rfl:    tacrolimus (PROTOPIC) 0.1 % ointment, Apply topically 2 (two) times daily as needed., Disp: , Rfl:    tamsulosin (FLOMAX) 0.4 MG CAPS capsule, , Disp: , Rfl:    triamcinolone cream (KENALOG) 0.1 %, APPLY SMALL AMOUNT TOPICALLY TWO TIMES A DAY AS NEEDED FOR RED RAISED ITCHY RASH. STOP WHEN SMOOTH. AVOID FACE, ARMPIT, AND GROIN, Disp: , Rfl:    ondansetron (ZOFRAN) 8 MG tablet, Take 1 tablet (8 mg total) by mouth every 8 (eight) hours as needed for nausea or vomiting., Disp: 30 tablet, Rfl: 1    Family History  Problem Relation Age  of Onset   Hypertension Mother    Thyroid disease Mother    Breast cancer Mother    Asthma Father      Social History   Tobacco Use   Smoking status: Former    Types: Cigarettes    Quit date: 05/14/1969    Years since quitting: 52.2   Smokeless tobacco: Never  Vaping Use   Vaping Use: Never used  Substance Use Topics   Alcohol use: No   Drug use: No    Allergies as of 07/23/2021   (No Known Allergies)    Review of Systems:    All systems reviewed and negative except where noted in HPI.   Physical Exam:  BP (!) 145/70 (BP Location: Left Arm, Patient Position: Sitting, Cuff Size: Normal)    Pulse 93    Temp 97.7 F (36.5 C) (Oral)    Ht 5\' 10"  (1.778 m)    Wt 230 lb 6 oz (104.5 kg)    BMI 33.06 kg/m  No LMP for male patient.  General:   Alert,  Well-developed, well-nourished, pleasant and cooperative in NAD Head:  Normocephalic and atraumatic. Eyes:  Sclera clear, no icterus.   Conjunctiva pink. Ears:  Normal auditory acuity. Nose:  No deformity, discharge, or lesions. Mouth:  No deformity or lesions,oropharynx pink & moist. Neck:  Supple; no masses or thyromegaly. Lungs:  Respirations even and unlabored.  Clear  throughout to auscultation.   No wheezes, crackles, or rhonchi. No acute distress. Heart:  Regular rate and rhythm; no murmurs, clicks, rubs, or gallops. Abdomen:  Normal bowel sounds. Soft, obese, non-tender and non-distended without masses, hepatosplenomegaly or hernias noted.  No guarding or rebound tenderness.   Rectal: Not performed Msk:  Symmetrical without gross deformities. Good, equal movement & strength bilaterally. Pulses:  Normal pulses noted. Extremities:  No clubbing or edema.  No cyanosis. Neurologic:  Alert and oriented x3;  grossly normal neurologically. Skin:  Intact without significant lesions or rashes. No jaundice. Psych:  Alert and cooperative. Normal mood and affect.  Imaging Studies: Reviewed, whole-body PET scan on 04/12/2020 came back unremarkable except for fatty liver  Assessment and Plan:   Kamoni Gentles is a 76 y.o. Caucasian male with metabolic syndrome, stage IV CKD, history of H. pylori negative mild lymphoma s/p radiation with no recurrence of disease as of 04/2020 is seen in consultation for 2 weeks history of early morning nause Patient has diabetes, last hemoglobin A1c 7.2.  Reported history of herbal supplements to lower his blood sugar, started 2 weeks ago.  Likely, his symptoms are related to supplements if these are causing hypoglycemia.  Nausea has resolved.  H. pylori breath test negative.  Patient received short course of PPI. Refill Zofran 8 mg every 8 hours as needed for nausea  Follow up as needed   [name], MD

## 2021-08-20 ENCOUNTER — Telehealth: Payer: Self-pay | Admitting: Gastroenterology

## 2021-08-20 NOTE — Telephone Encounter (Signed)
Called patient and informed him that needed to come by his PCP because we do not do diabetes

## 2021-08-20 NOTE — Telephone Encounter (Signed)
Pt is needing a letter stating that he has diabetes and the date that he was diagnosed.

## 2021-08-24 ENCOUNTER — Inpatient Hospital Stay: Payer: PPO | Attending: Oncology

## 2021-08-24 ENCOUNTER — Other Ambulatory Visit: Payer: Self-pay

## 2021-08-24 ENCOUNTER — Inpatient Hospital Stay (HOSPITAL_BASED_OUTPATIENT_CLINIC_OR_DEPARTMENT_OTHER): Payer: PPO | Admitting: Oncology

## 2021-08-24 ENCOUNTER — Encounter: Payer: Self-pay | Admitting: Oncology

## 2021-08-24 VITALS — BP 117/60 | HR 76 | Temp 96.2°F | Resp 19 | Wt 229.9 lb

## 2021-08-24 DIAGNOSIS — Z08 Encounter for follow-up examination after completed treatment for malignant neoplasm: Secondary | ICD-10-CM | POA: Diagnosis not present

## 2021-08-24 DIAGNOSIS — Z8349 Family history of other endocrine, nutritional and metabolic diseases: Secondary | ICD-10-CM | POA: Insufficient documentation

## 2021-08-24 DIAGNOSIS — Z87891 Personal history of nicotine dependence: Secondary | ICD-10-CM | POA: Insufficient documentation

## 2021-08-24 DIAGNOSIS — N400 Enlarged prostate without lower urinary tract symptoms: Secondary | ICD-10-CM | POA: Diagnosis not present

## 2021-08-24 DIAGNOSIS — Z79899 Other long term (current) drug therapy: Secondary | ICD-10-CM | POA: Diagnosis not present

## 2021-08-24 DIAGNOSIS — Z8249 Family history of ischemic heart disease and other diseases of the circulatory system: Secondary | ICD-10-CM | POA: Diagnosis not present

## 2021-08-24 DIAGNOSIS — Z79621 Long term (current) use of calcineurin inhibitor: Secondary | ICD-10-CM | POA: Insufficient documentation

## 2021-08-24 DIAGNOSIS — Z85828 Personal history of other malignant neoplasm of skin: Secondary | ICD-10-CM | POA: Insufficient documentation

## 2021-08-24 DIAGNOSIS — Z803 Family history of malignant neoplasm of breast: Secondary | ICD-10-CM | POA: Diagnosis not present

## 2021-08-24 DIAGNOSIS — Z923 Personal history of irradiation: Secondary | ICD-10-CM | POA: Insufficient documentation

## 2021-08-24 DIAGNOSIS — C884 Extranodal marginal zone B-cell lymphoma of mucosa-associated lymphoid tissue [MALT-lymphoma]: Secondary | ICD-10-CM | POA: Insufficient documentation

## 2021-08-24 DIAGNOSIS — Z8572 Personal history of non-Hodgkin lymphomas: Secondary | ICD-10-CM | POA: Diagnosis not present

## 2021-08-24 DIAGNOSIS — Z836 Family history of other diseases of the respiratory system: Secondary | ICD-10-CM | POA: Insufficient documentation

## 2021-08-24 DIAGNOSIS — K449 Diaphragmatic hernia without obstruction or gangrene: Secondary | ICD-10-CM | POA: Diagnosis not present

## 2021-08-24 DIAGNOSIS — N184 Chronic kidney disease, stage 4 (severe): Secondary | ICD-10-CM | POA: Insufficient documentation

## 2021-08-24 DIAGNOSIS — Z87442 Personal history of urinary calculi: Secondary | ICD-10-CM | POA: Insufficient documentation

## 2021-08-24 DIAGNOSIS — E785 Hyperlipidemia, unspecified: Secondary | ICD-10-CM | POA: Diagnosis not present

## 2021-08-24 DIAGNOSIS — D751 Secondary polycythemia: Secondary | ICD-10-CM | POA: Insufficient documentation

## 2021-08-24 DIAGNOSIS — D509 Iron deficiency anemia, unspecified: Secondary | ICD-10-CM

## 2021-08-24 LAB — CBC WITH DIFFERENTIAL/PLATELET
Abs Immature Granulocytes: 0.07 10*3/uL (ref 0.00–0.07)
Basophils Absolute: 0 10*3/uL (ref 0.0–0.1)
Basophils Relative: 1 %
Eosinophils Absolute: 0.2 10*3/uL (ref 0.0–0.5)
Eosinophils Relative: 2 %
HCT: 53.4 % — ABNORMAL HIGH (ref 39.0–52.0)
Hemoglobin: 17.6 g/dL — ABNORMAL HIGH (ref 13.0–17.0)
Immature Granulocytes: 1 %
Lymphocytes Relative: 13 %
Lymphs Abs: 0.8 10*3/uL (ref 0.7–4.0)
MCH: 32.8 pg (ref 26.0–34.0)
MCHC: 33 g/dL (ref 30.0–36.0)
MCV: 99.6 fL (ref 80.0–100.0)
Monocytes Absolute: 0.8 10*3/uL (ref 0.1–1.0)
Monocytes Relative: 12 %
Neutro Abs: 4.7 10*3/uL (ref 1.7–7.7)
Neutrophils Relative %: 71 %
Platelets: 166 10*3/uL (ref 150–400)
RBC: 5.36 MIL/uL (ref 4.22–5.81)
RDW: 13.5 % (ref 11.5–15.5)
WBC: 6.6 10*3/uL (ref 4.0–10.5)
nRBC: 0 % (ref 0.0–0.2)

## 2021-08-24 LAB — COMPREHENSIVE METABOLIC PANEL
ALT: 41 U/L (ref 0–44)
AST: 30 U/L (ref 15–41)
Albumin: 3.9 g/dL (ref 3.5–5.0)
Alkaline Phosphatase: 89 U/L (ref 38–126)
Anion gap: 7 (ref 5–15)
BUN: 29 mg/dL — ABNORMAL HIGH (ref 8–23)
CO2: 28 mmol/L (ref 22–32)
Calcium: 9.2 mg/dL (ref 8.9–10.3)
Chloride: 103 mmol/L (ref 98–111)
Creatinine, Ser: 3.04 mg/dL — ABNORMAL HIGH (ref 0.61–1.24)
GFR, Estimated: 20 mL/min — ABNORMAL LOW (ref >60)
Glucose, Bld: 155 mg/dL — ABNORMAL HIGH (ref 70–99)
Potassium: 4.6 mmol/L (ref 3.5–5.1)
Sodium: 138 mmol/L (ref 135–145)
Total Bilirubin: 0.4 mg/dL (ref 0.3–1.2)
Total Protein: 7.4 g/dL (ref 6.5–8.1)

## 2021-08-24 LAB — LACTATE DEHYDROGENASE: LDH: 164 U/L (ref 98–192)

## 2021-08-26 NOTE — Progress Notes (Signed)
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  2.4 cm hypoattenuating renal lesion consistent with a cyst he underwent EGD which showed widely patent Schatzki's ring in the distal esophagus.  Small hiatal hernia.  Any area of abnormal mucosa with multifocal areas of ulceration with heaped up edges as well as multiple pigmented spots within the ulcers found in the anterior 1/Dasovich of the stomach.  It appears that there is any mass-effect/extrinsic compression of the stomach versus primary gastric malignancy.  No active bleeding as of recent bleeding.  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 Ki67 5%. No rearrangement of MALT 1 was observed H. pylori immunostain negative.    Repeat stool antigen H. pylori testing was also negative.he received EBRT to the involved area.  Repeat endoscopy showed no evidence of lymphoma on EGD    Interval history-patient had seen Dr. Marius Ditch recently for his symptoms of nauseaWith started after he took supplements for his blood sugars.  Subsequently his nausea  has resolved.  Denies any specific complaints at this time other than chronic fatigue  ECOG PS- 1 Pain scale- 0   Review of systems- Review of Systems  Constitutional:  Negative for chills, fever, malaise/fatigue and weight loss.  HENT:  Negative for congestion, ear discharge and nosebleeds.   Eyes:  Negative for blurred vision.  Respiratory:  Negative for cough, hemoptysis, sputum production, shortness of breath and wheezing.   Cardiovascular:  Negative for chest pain, palpitations, orthopnea and claudication.  Gastrointestinal:  Negative for abdominal pain, blood in stool, constipation, diarrhea, heartburn, melena, nausea and vomiting.  Genitourinary:  Negative for dysuria, flank pain, frequency, hematuria and urgency.  Musculoskeletal:  Negative for back pain, joint pain and myalgias.  Skin:  Negative for rash.  Neurological:  Negative for dizziness, tingling, focal weakness, seizures, weakness and headaches.  Endo/Heme/Allergies:  Does not bruise/bleed easily.  Psychiatric/Behavioral:  Negative for depression and suicidal ideas. The patient does not have insomnia.      No Known Allergies   Past Medical History:  Diagnosis Date   Basal cell carcinoma 2020   back of neck   Cancer ([place]) 1991   malignant melanoma   Chronic kidney disease    Essential hypertension    Heart murmur    History of diverticulitis 06/2014   History of kidney stones    Hyperlipidemia    Iron deficiency anemia due to chronic blood loss 12/03/2019   MALT lymphoma ([place])    Membranous glomerulonephritis    Myasthenia gravis ([place])    Obesity      Past Surgical History:  Procedure Laterality Date   ESOPHAGOGASTRODUODENOSCOPY (EGD) WITH  PROPOFOL N/A 04/27/2020   Procedure: ESOPHAGOGASTRODUODENOSCOPY (EGD) WITH PROPOFOL;  Surgeon: Lin Landsman, MD;  Location: [facility] ENDOSCOPY;  Service: Gastroenterology;  Laterality: N/A;   EYE SURGERY     cataract surgery done at [facility]    LITHOTRIPSY      submandibular gland removal Left     Social History   Socioeconomic History   Marital status: Married    Spouse name: Not on file   Number of children: Not on file   Years of education: Not on file   Highest education level: High school graduate  Occupational History   Not on file  Tobacco Use   Smoking status: Former    Types: Cigarettes    Quit date: 05/14/1969    Years since quitting: 52.3   Smokeless tobacco: Never  Vaping Use   Vaping Use: Never used  Substance and Sexual Activity   Alcohol use: No   Drug use: No   Sexual activity: Not Currently  Other Topics Concern   Not on file  Social History Narrative   Not on file   Social Determinants of Health   Financial Resource Strain: Not on file  Food Insecurity: Not on file  Transportation Needs: Not on file  Physical Activity: Not on file  Stress: Not on file  Social Connections: Not on file  Intimate Partner Violence: Not on file    Family History  Problem Relation Age of Onset   Hypertension Mother    Thyroid disease Mother    Breast cancer Mother    Asthma Father      Current Outpatient Medications:    albuterol (VENTOLIN HFA) 108 (90 Base) MCG/ACT inhaler, Inhale into the lungs., Disp: , Rfl:    atorvastatin (LIPITOR) 80 MG tablet, TAKE ONE-HALF TABLET BY MOUTH TWO TIMES A DAY, Disp: , Rfl:    empagliflozin (JARDIANCE) 25 MG TABS tablet, Take 1 tablet by mouth daily., Disp: , Rfl:    famotidine (PEPCID) 40 MG tablet, TAKE ONE TABLET BY MOUTH  PRN, Disp: , Rfl:    gabapentin (NEURONTIN) 300 MG capsule, Take by mouth., Disp: , Rfl:    levothyroxine (SYNTHROID) 175 MCG tablet, Take 175 mcg by mouth daily., Disp: , Rfl:    losartan (COZAAR) 100 MG tablet, Take by mouth., Disp: , Rfl:    Nutritional Supplements (THERALITH XR PO), Take by mouth 2 (two) times daily. Takes 2 tablets in AM and 2 tablets in PM, Disp: , Rfl:    Omega-3 Fatty Acids (OMEGA 3 PO), Take by mouth 2 (two) times daily., Disp: , Rfl:     omeprazole (PRILOSEC) 40 MG capsule, Take 1 capsule (40 mg total) by mouth daily before breakfast., Disp: 30 capsule, Rfl: 0   pyridostigmine (MESTINON) 60 MG tablet, Take 1 tablet by mouth 2 (two) times daily., Disp: , Rfl:    triamcinolone cream (KENALOG) 0.1 %, APPLY SMALL AMOUNT TOPICALLY TWO TIMES A DAY AS NEEDED FOR RED RAISED ITCHY RASH. STOP WHEN SMOOTH. AVOID FACE, ARMPIT, AND GROIN, Disp: , Rfl:    Cholecalciferol (VITAMIN D3 ULTRA STRENGTH) 125 MCG (5000 UT) capsule, Take 5,000 Units by mouth daily. (Patient not taking: Reported on 08/24/2021), Disp: , Rfl:    diltiazem (CARDIZEM SR) 120 MG 12 hr capsule, TAKE 1 CAPSULE BY MOUTH EVERY 24 HOURS (Patient not taking: Reported on 08/24/2021), Disp: , Rfl:    dutasteride (AVODART) 0.5 MG capsule, TAKE 1 CAPSULE BY MOUTH EVERY 24 HOURS (Patient not taking: Reported on 08/24/2021), Disp: ,  Rfl:    ferrous sulfate 325 (65 FE) MG tablet, Take 1 tablet by mouth daily with breakfast. (Patient not taking: Reported on 08/24/2021), Disp: , Rfl:    ondansetron (ZOFRAN) 8 MG tablet, Take 1 tablet (8 mg total) by mouth every 8 (eight) hours as needed for nausea or vomiting. (Patient not taking: Reported on 08/24/2021), Disp: 30 tablet, Rfl: 1   promethazine-dextromethorphan (PROMETHAZINE-DM) 6.25-15 MG/5ML syrup, Take 5 mLs by mouth every 4 (four) hours as needed. (Patient not taking: Reported on 08/24/2021), Disp: , Rfl:    Saw Palmetto, Serenoa repens, 450 MG CAPS, Take 1,350 mg by mouth daily. (Patient not taking: Reported on 08/24/2021), Disp: , Rfl:    tacrolimus (PROTOPIC) 0.1 % ointment, Apply topically 2 (two) times daily as needed. (Patient not taking: Reported on 08/24/2021), Disp: , Rfl:    tamsulosin (FLOMAX) 0.4 MG CAPS capsule, , Disp: , Rfl:   Physical exam:  Vitals:   08/24/21 1104  BP: 117/60  Pulse: 76  Resp: 19  Temp: (!) 96.2 F (35.7 C)  SpO2: 98%  Weight: 229 lb 14.4 oz (104.3 kg)   Physical Exam Constitutional:      General: He is not in  acute distress. Cardiovascular:     Rate and Rhythm: Normal rate and regular rhythm.     Heart sounds: Normal heart sounds.  Pulmonary:     Effort: Pulmonary effort is normal.     Breath sounds: Normal breath sounds.  Abdominal:     Comments: No palpable hepatosplenomegaly  Lymphadenopathy:     Comments: No palpable cervical, supraclavicular, axillary or inguinal adenopathy    Skin:    General: Skin is warm and dry.  Neurological:     Mental Status: He is alert and oriented to person, place, and time.     CMP Latest Ref Rng & Units 08/24/2021  Glucose 70 - 99 mg/dL 155(H)  BUN 8 - 23 mg/dL 29(H)  Creatinine 0.61 - 1.24 mg/dL 3.04(H)  Sodium 135 - 145 mmol/L 138  Potassium 3.5 - 5.1 mmol/L 4.6  Chloride 98 - 111 mmol/L 103  CO2 22 - 32 mmol/L 28  Calcium 8.9 - 10.3 mg/dL 9.2  Total Protein 6.5 - 8.1 g/dL 7.4  Total Bilirubin 0.3 - 1.2 mg/dL 0.4  Alkaline Phos 38 - 126 U/L 89  AST 15 - 41 U/L 30  ALT 0 - 44 U/L 41   CBC Latest Ref Rng & Units 08/24/2021  WBC 4.0 - 10.5 K/uL 6.6  Hemoglobin 13.0 - 17.0 g/dL 17.6(H)  Hematocrit 39.0 - 52.0 % 53.4(H)  Platelets 150 - 400 K/uL 166     Assessment and plan- Patient is a 76 y.o. male with history of stage IV CKD, H. pylori negative MALT lymphoma s/p radiation here for routine follow-up  Clinically patient is doing well with no concerning signs and symptoms of recurrence based on today's exam.Surveillance endoscopy and imaging is not indicated at this time unless there are suspicious signs and symptoms  Patient does have mild polycythemia with a hemoglobin that fluctuates between 16.5-17.5.  I suspect this is secondary toUnderlying obstructive sleep apnea given patient's obesity.  I have asked the patient to get in touch with Dr. Ouida Sills to get a sleep study done.  I will also be checking his JAK2, EPO levels with next set of labs in 6 months   Visit Diagnosis 1. Encounter for follow-up surveillance of lymphoma      Dr.  [name], MD, MPH [facility]  at [facility] [phone] 08/26/2021 6:20 PM

## 2021-08-28 DIAGNOSIS — N401 Enlarged prostate with lower urinary tract symptoms: Secondary | ICD-10-CM | POA: Diagnosis not present

## 2021-08-28 DIAGNOSIS — R972 Elevated prostate specific antigen [PSA]: Secondary | ICD-10-CM | POA: Diagnosis not present

## 2021-08-28 DIAGNOSIS — Z79899 Other long term (current) drug therapy: Secondary | ICD-10-CM | POA: Diagnosis not present

## 2021-10-02 DIAGNOSIS — E039 Hypothyroidism, unspecified: Secondary | ICD-10-CM | POA: Diagnosis not present

## 2021-10-02 DIAGNOSIS — I129 Hypertensive chronic kidney disease with stage 1 through stage 4 chronic kidney disease, or unspecified chronic kidney disease: Secondary | ICD-10-CM | POA: Diagnosis not present

## 2021-10-02 DIAGNOSIS — E118 Type 2 diabetes mellitus with unspecified complications: Secondary | ICD-10-CM | POA: Diagnosis not present

## 2021-10-02 DIAGNOSIS — N184 Chronic kidney disease, stage 4 (severe): Secondary | ICD-10-CM | POA: Diagnosis not present

## 2021-10-09 DIAGNOSIS — N2581 Secondary hyperparathyroidism of renal origin: Secondary | ICD-10-CM | POA: Diagnosis not present

## 2021-10-09 DIAGNOSIS — E118 Type 2 diabetes mellitus with unspecified complications: Secondary | ICD-10-CM | POA: Diagnosis not present

## 2021-10-09 DIAGNOSIS — I129 Hypertensive chronic kidney disease with stage 1 through stage 4 chronic kidney disease, or unspecified chronic kidney disease: Secondary | ICD-10-CM | POA: Diagnosis not present

## 2021-10-09 DIAGNOSIS — Z0000 Encounter for general adult medical examination without abnormal findings: Secondary | ICD-10-CM | POA: Diagnosis not present

## 2021-10-09 DIAGNOSIS — I700 Atherosclerosis of aorta: Secondary | ICD-10-CM | POA: Diagnosis not present

## 2021-10-09 DIAGNOSIS — E7800 Pure hypercholesterolemia, unspecified: Secondary | ICD-10-CM | POA: Diagnosis not present

## 2021-10-09 DIAGNOSIS — N052 Unspecified nephritic syndrome with diffuse membranous glomerulonephritis: Secondary | ICD-10-CM | POA: Diagnosis not present

## 2021-10-09 DIAGNOSIS — E039 Hypothyroidism, unspecified: Secondary | ICD-10-CM | POA: Diagnosis not present

## 2021-10-09 DIAGNOSIS — C884 Extranodal marginal zone B-cell lymphoma of mucosa-associated lymphoid tissue [MALT-lymphoma]: Secondary | ICD-10-CM | POA: Diagnosis not present

## 2021-10-09 DIAGNOSIS — G7000 Myasthenia gravis without (acute) exacerbation: Secondary | ICD-10-CM | POA: Diagnosis not present

## 2021-10-09 DIAGNOSIS — N184 Chronic kidney disease, stage 4 (severe): Secondary | ICD-10-CM | POA: Diagnosis not present

## 2021-10-16 NOTE — Telephone Encounter (Signed)
error 

## 2021-12-04 IMAGING — CT NM PET TUM IMG RESTAG (PS) SKULL BASE T - THIGH
1 of 10 series · 1 of 25 positions shown · non-contrast
Comparison: 12/23/2019

CLINICAL DATA: Subsequent treatment strategy for MALT lymphoma.

EXAM:
NUCLEAR MEDICINE PET SKULL BASE TO THIGH
TECHNIQUE: 12.7 mCi F-18 FDG was injected intravenously. Full-ring PET imaging
was performed from the skull base to thigh after the radiotracer. CT
data was obtained and used for attenuation correction and anatomic
localization.
Fasting blood glucose: 116 mg/dl

[Series 3: ct wb 5.0 b30f · axial · 5.0mm · 0.98mm/px · 1 of 329 slices shown]
[im 329/329  brain]
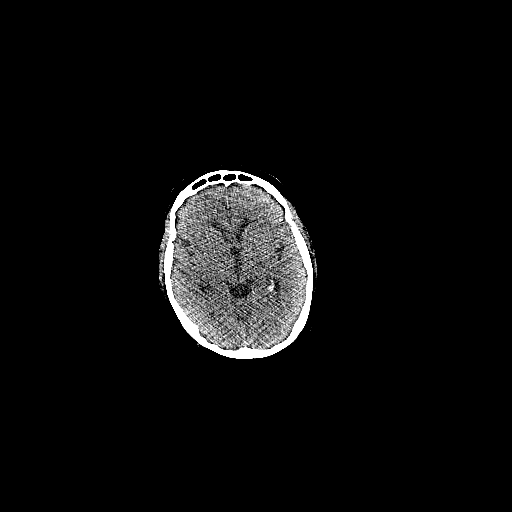

[1 of 25 positions shown; findings below may reference images not displayed]

FINDINGS: Mediastinal blood-pool activity (background): SUV max =

Liver activity (reference): SUV max =

NECK:  No hypermetabolic lymph nodes or masses.

Incidental CT findings:  None.

CHEST: No hypermetabolic masses or lymphadenopathy. No suspicious
pulmonary nodules seen on CT images.

Incidental CT findings:  None.

ABDOMEN/PELVIS: Previously seen focal hypermetabolic activity in the
body the stomach has resolved since prior exam. No abnormal
hypermetabolic activity within the liver, pancreas, adrenal glands,
or spleen. No hypermetabolic lymph nodes in the abdomen or pelvis.

Incidental CT findings: Diffuse hepatic steatosis again seen with
stable low-attenuation lesion in the left lobe. Left nephrolithiasis
again demonstrated, without evidence of hydronephrosis.
Diverticulosis is seen mainly involving the descending and sigmoid
colon, however there is no evidence of diverticulitis. Aortic
atherosclerotic calcification noted.

SKELETON: No focal hypermetabolic bone lesions to suggest skeletal
metastasis.

Incidental CT findings:  None.
IMPRESSION: Resolution of focal hypermetabolic activity in stomach since
previous study. No evidence of metabolically active disease.
([HOSPITAL] score 1)

## 2022-02-05 DIAGNOSIS — I129 Hypertensive chronic kidney disease with stage 1 through stage 4 chronic kidney disease, or unspecified chronic kidney disease: Secondary | ICD-10-CM | POA: Diagnosis not present

## 2022-02-05 DIAGNOSIS — E118 Type 2 diabetes mellitus with unspecified complications: Secondary | ICD-10-CM | POA: Diagnosis not present

## 2022-02-05 DIAGNOSIS — N184 Chronic kidney disease, stage 4 (severe): Secondary | ICD-10-CM | POA: Diagnosis not present

## 2022-02-05 DIAGNOSIS — E039 Hypothyroidism, unspecified: Secondary | ICD-10-CM | POA: Diagnosis not present

## 2022-02-05 DIAGNOSIS — E7800 Pure hypercholesterolemia, unspecified: Secondary | ICD-10-CM | POA: Diagnosis not present

## 2022-02-13 DIAGNOSIS — E118 Type 2 diabetes mellitus with unspecified complications: Secondary | ICD-10-CM | POA: Diagnosis not present

## 2022-02-13 DIAGNOSIS — I129 Hypertensive chronic kidney disease with stage 1 through stage 4 chronic kidney disease, or unspecified chronic kidney disease: Secondary | ICD-10-CM | POA: Diagnosis not present

## 2022-02-13 DIAGNOSIS — N184 Chronic kidney disease, stage 4 (severe): Secondary | ICD-10-CM | POA: Diagnosis not present

## 2022-02-13 DIAGNOSIS — I700 Atherosclerosis of aorta: Secondary | ICD-10-CM | POA: Diagnosis not present

## 2022-02-13 DIAGNOSIS — G7000 Myasthenia gravis without (acute) exacerbation: Secondary | ICD-10-CM | POA: Diagnosis not present

## 2022-02-13 DIAGNOSIS — E039 Hypothyroidism, unspecified: Secondary | ICD-10-CM | POA: Diagnosis not present

## 2022-03-04 ENCOUNTER — Ambulatory Visit: Payer: PPO | Admitting: Oncology

## 2022-03-04 ENCOUNTER — Other Ambulatory Visit: Payer: PPO

## 2022-03-25 ENCOUNTER — Other Ambulatory Visit: Payer: Self-pay

## 2022-03-25 ENCOUNTER — Inpatient Hospital Stay (HOSPITAL_BASED_OUTPATIENT_CLINIC_OR_DEPARTMENT_OTHER): Payer: PPO | Admitting: Oncology

## 2022-03-25 ENCOUNTER — Encounter: Payer: Self-pay | Admitting: Oncology

## 2022-03-25 ENCOUNTER — Inpatient Hospital Stay: Payer: PPO | Attending: Oncology

## 2022-03-25 VITALS — BP 112/79 | HR 74 | Temp 98.9°F | Resp 18 | Wt 223.1 lb

## 2022-03-25 DIAGNOSIS — Z08 Encounter for follow-up examination after completed treatment for malignant neoplasm: Secondary | ICD-10-CM

## 2022-03-25 DIAGNOSIS — Z79899 Other long term (current) drug therapy: Secondary | ICD-10-CM | POA: Insufficient documentation

## 2022-03-25 DIAGNOSIS — C884 Extranodal marginal zone B-cell lymphoma of mucosa-associated lymphoid tissue [MALT-lymphoma]: Secondary | ICD-10-CM

## 2022-03-25 DIAGNOSIS — Z836 Family history of other diseases of the respiratory system: Secondary | ICD-10-CM | POA: Diagnosis not present

## 2022-03-25 DIAGNOSIS — Z8572 Personal history of non-Hodgkin lymphomas: Secondary | ICD-10-CM

## 2022-03-25 DIAGNOSIS — Z8349 Family history of other endocrine, nutritional and metabolic diseases: Secondary | ICD-10-CM | POA: Insufficient documentation

## 2022-03-25 DIAGNOSIS — D751 Secondary polycythemia: Secondary | ICD-10-CM

## 2022-03-25 DIAGNOSIS — R110 Nausea: Secondary | ICD-10-CM | POA: Diagnosis not present

## 2022-03-25 DIAGNOSIS — R5383 Other fatigue: Secondary | ICD-10-CM | POA: Insufficient documentation

## 2022-03-25 DIAGNOSIS — C8590 Non-Hodgkin lymphoma, unspecified, unspecified site: Secondary | ICD-10-CM | POA: Insufficient documentation

## 2022-03-25 DIAGNOSIS — Z87442 Personal history of urinary calculi: Secondary | ICD-10-CM | POA: Insufficient documentation

## 2022-03-25 DIAGNOSIS — Z803 Family history of malignant neoplasm of breast: Secondary | ICD-10-CM | POA: Diagnosis not present

## 2022-03-25 DIAGNOSIS — Z85828 Personal history of other malignant neoplasm of skin: Secondary | ICD-10-CM | POA: Insufficient documentation

## 2022-03-25 DIAGNOSIS — K449 Diaphragmatic hernia without obstruction or gangrene: Secondary | ICD-10-CM | POA: Insufficient documentation

## 2022-03-25 DIAGNOSIS — Z8249 Family history of ischemic heart disease and other diseases of the circulatory system: Secondary | ICD-10-CM | POA: Insufficient documentation

## 2022-03-25 LAB — CBC WITH DIFFERENTIAL/PLATELET
Abs Immature Granulocytes: 0.07 10*3/uL (ref 0.00–0.07)
Basophils Absolute: 0 10*3/uL (ref 0.0–0.1)
Basophils Relative: 1 %
Eosinophils Absolute: 0.1 10*3/uL (ref 0.0–0.5)
Eosinophils Relative: 2 %
HCT: 54.2 % — ABNORMAL HIGH (ref 39.0–52.0)
Hemoglobin: 18.4 g/dL — ABNORMAL HIGH (ref 13.0–17.0)
Immature Granulocytes: 1 %
Lymphocytes Relative: 13 %
Lymphs Abs: 0.9 10*3/uL (ref 0.7–4.0)
MCH: 32.9 pg (ref 26.0–34.0)
MCHC: 33.9 g/dL (ref 30.0–36.0)
MCV: 97 fL (ref 80.0–100.0)
Monocytes Absolute: 0.8 10*3/uL (ref 0.1–1.0)
Monocytes Relative: 12 %
Neutro Abs: 4.7 10*3/uL (ref 1.7–7.7)
Neutrophils Relative %: 71 %
Platelets: 157 10*3/uL (ref 150–400)
RBC: 5.59 MIL/uL (ref 4.22–5.81)
RDW: 13.6 % (ref 11.5–15.5)
WBC: 6.6 10*3/uL (ref 4.0–10.5)
nRBC: 0 % (ref 0.0–0.2)

## 2022-03-25 LAB — COMPREHENSIVE METABOLIC PANEL
ALT: 30 U/L (ref 0–44)
AST: 27 U/L (ref 15–41)
Albumin: 4 g/dL (ref 3.5–5.0)
Alkaline Phosphatase: 83 U/L (ref 38–126)
Anion gap: 5 (ref 5–15)
BUN: 29 mg/dL — ABNORMAL HIGH (ref 8–23)
CO2: 26 mmol/L (ref 22–32)
Calcium: 9.4 mg/dL (ref 8.9–10.3)
Chloride: 107 mmol/L (ref 98–111)
Creatinine, Ser: 3.09 mg/dL — ABNORMAL HIGH (ref 0.61–1.24)
GFR, Estimated: 20 mL/min — ABNORMAL LOW (ref >60)
Glucose, Bld: 159 mg/dL — ABNORMAL HIGH (ref 70–99)
Potassium: 4.2 mmol/L (ref 3.5–5.1)
Sodium: 138 mmol/L (ref 135–145)
Total Bilirubin: 1.1 mg/dL (ref 0.3–1.2)
Total Protein: 7.3 g/dL (ref 6.5–8.1)

## 2022-03-25 LAB — LACTATE DEHYDROGENASE: LDH: 153 U/L (ref 98–192)

## 2022-03-25 NOTE — Progress Notes (Signed)
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  2.4 cm hypoattenuating renal lesion consistent with a cyst he underwent EGD which showed widely patent Schatzki's ring in the distal esophagus.  Small hiatal hernia.  Any area of abnormal mucosa with multifocal areas of ulceration with heaped up edges as well as multiple pigmented spots within the ulcers found in the anterior 1/Dasovich of the stomach.  It appears that there is any mass-effect/extrinsic compression of the stomach versus primary gastric malignancy.  No active bleeding as of recent bleeding.  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 Ki67 5%. No rearrangement of MALT 1 was observed H. pylori immunostain negative.    Repeat stool antigen H. pylori testing was also negative.he received EBRT to the involved area in August 2021.  Repeat endoscopy showed no evidence of lymphoma on EGD      Interval history-patient reports on and off nausea which has been ongoing for a while it tends to get better but then symptoms come back  again.  His lastEndoscopy was in November 2021 after his lymphoma was treated and was essentially normal at that point.  ECOG PS- 1 Pain scale- 0   Review of systems- Review of Systems  Constitutional:  Positive for malaise/fatigue. Negative for chills, fever and weight loss.  HENT:  Negative for congestion, ear discharge and nosebleeds.   Eyes:  Negative for blurred vision.  Respiratory:  Negative for cough, hemoptysis, sputum production, shortness of breath and wheezing.   Cardiovascular:  Negative for chest pain, palpitations, orthopnea and claudication.  Gastrointestinal:  Positive for nausea. Negative for abdominal pain, blood in stool, constipation, diarrhea, heartburn, melena and vomiting.  Genitourinary:  Negative for dysuria, flank pain, frequency, hematuria and urgency.  Musculoskeletal:  Negative for back pain, joint pain and myalgias.  Skin:  Negative for rash.  Neurological:  Negative for dizziness, tingling, focal weakness, seizures, weakness and headaches.  Endo/Heme/Allergies:  Does not bruise/bleed easily.  Psychiatric/Behavioral:  Negative for depression and suicidal ideas. The patient does not have insomnia.       No Known Allergies   Past Medical History:  Diagnosis Date   Basal cell carcinoma 2020   back of neck   Cancer ([place]) 1991   malignant melanoma   Chronic kidney disease    Essential hypertension    Heart murmur    History of diverticulitis 06/2014   History of kidney stones    Hyperlipidemia    Iron deficiency anemia due to chronic blood loss 12/03/2019   MALT lymphoma ([place])    Membranous glomerulonephritis    Myasthenia gravis ([place])    Obesity  Past Surgical History:  Procedure Laterality Date   ESOPHAGOGASTRODUODENOSCOPY (EGD) WITH PROPOFOL N/A 04/27/2020   Procedure: ESOPHAGOGASTRODUODENOSCOPY (EGD) WITH PROPOFOL;  Surgeon: Lin Landsman, MD;  Location: [facility] ENDOSCOPY;  Service: Gastroenterology;  Laterality: N/A;   EYE SURGERY      cataract surgery done at [facility]    LITHOTRIPSY     submandibular gland removal Left     Social History   Socioeconomic History   Marital status: Married    Spouse name: Not on file   Number of children: Not on file   Years of education: Not on file   Highest education level: High school graduate  Occupational History   Not on file  Tobacco Use   Smoking status: Former    Types: Cigarettes    Quit date: 05/14/1969    Years since quitting: 52.8   Smokeless tobacco: Never  Vaping Use   Vaping Use: Never used  Substance and Sexual Activity   Alcohol use: No   Drug use: No   Sexual activity: Not Currently  Other Topics Concern   Not on file  Social History Narrative   Not on file   Social Determinants of Health   Financial Resource Strain: Low Risk  (12/18/2017)   Overall Financial Resource Strain (CARDIA)    Difficulty of Paying Living Expenses: Not hard at all  Food Insecurity: No Food Insecurity (12/18/2017)   Hunger Vital Sign    Worried About Running Out of Food in the Last Year: Never true    [place] in the Last Year: Never true  Transportation Needs: No Transportation Needs (12/18/2017)   PRAPARE - Hydrologist (Medical): No    Lack of Transportation (Non-Medical): No  Physical Activity: Insufficiently Active (12/21/2018)   Exercise Vital Sign    Days of Exercise per Week: 3 days    Minutes of Exercise per Session: 30 min  Stress: No Stress Concern Present (12/18/2017)   [place]    Feeling of Stress : Not at all  Social Connections: Moderately Integrated (12/18/2017)   Social Connection and Isolation Panel [NHANES]    Frequency of Communication with Friends and Family: More than three times a week    Frequency of Social Gatherings with Friends and Family: More than three times a week    Attends Religious Services: More than 4 times per year    Active Member of  [employer] or Organizations: No    Attends Archivist Meetings: Never    Marital Status: Married  Human resources officer Violence: Not At Risk (12/18/2017)   Humiliation, Afraid, Rape, and Kick questionnaire    Fear of Current or Ex-Partner: No    Emotionally Abused: No    Physically Abused: No    Sexually Abused: No    Family History  Problem Relation Age of Onset   Hypertension Mother    Thyroid disease Mother    Breast cancer Mother    Asthma Father      Current Outpatient Medications:    albuterol (VENTOLIN HFA) 108 (90 Base) MCG/ACT inhaler, Inhale into the lungs., Disp: , Rfl:    atorvastatin (LIPITOR) 80 MG tablet, Take 80 mg by mouth daily., Disp: , Rfl:    empagliflozin (JARDIANCE) 25 MG TABS tablet, Take 1 tablet by mouth daily., Disp: , Rfl:    famotidine (PEPCID) 10 MG tablet, Take 10 mg by mouth daily., Disp: , Rfl:  gabapentin (NEURONTIN) 300 MG capsule, Take by mouth., Disp: , Rfl:    levothyroxine (SYNTHROID) 175 MCG tablet, Take 175 mcg by mouth daily., Disp: , Rfl:    losartan (COZAAR) 100 MG tablet, Take by mouth., Disp: , Rfl:    Nutritional Supplements (THERALITH XR PO), Take by mouth 2 (two) times daily. Takes 2 tablets in AM and 2 tablets in PM, Disp: , Rfl:    ondansetron (ZOFRAN) 8 MG tablet, Take 1 tablet (8 mg total) by mouth every 8 (eight) hours as needed for nausea or vomiting., Disp: 30 tablet, Rfl: 1   pyridostigmine (MESTINON) 60 MG tablet, Take 1 tablet by mouth 2 (two) times daily., Disp: , Rfl:    triamcinolone cream (KENALOG) 0.1 %, APPLY SMALL AMOUNT TOPICALLY TWO TIMES A DAY AS NEEDED FOR RED RAISED ITCHY RASH. STOP WHEN SMOOTH. AVOID FACE, ARMPIT, AND GROIN, Disp: , Rfl:    Cholecalciferol (VITAMIN D3 ULTRA STRENGTH) 125 MCG (5000 UT) capsule, Take 5,000 Units by mouth daily. (Patient not taking: Reported on 08/24/2021), Disp: , Rfl:    diltiazem (CARDIZEM SR) 120 MG 12 hr capsule, TAKE 1 CAPSULE BY MOUTH EVERY 24 HOURS (Patient not taking:  Reported on 08/24/2021), Disp: , Rfl:    dutasteride (AVODART) 0.5 MG capsule, TAKE 1 CAPSULE BY MOUTH EVERY 24 HOURS (Patient not taking: Reported on 08/24/2021), Disp: , Rfl:    ferrous sulfate 325 (65 FE) MG tablet, Take 1 tablet by mouth daily with breakfast. (Patient not taking: Reported on 08/24/2021), Disp: , Rfl:    Omega-3 Fatty Acids (OMEGA 3 PO), Take by mouth 2 (two) times daily. (Patient not taking: Reported on 03/25/2022), Disp: , Rfl:    omeprazole (PRILOSEC) 40 MG capsule, Take 1 capsule (40 mg total) by mouth daily before breakfast. (Patient not taking: Reported on 03/25/2022), Disp: 30 capsule, Rfl: 0   promethazine-dextromethorphan (PROMETHAZINE-DM) 6.25-15 MG/5ML syrup, Take 5 mLs by mouth every 4 (four) hours as needed. (Patient not taking: Reported on 08/24/2021), Disp: , Rfl:    Saw Palmetto, Serenoa repens, 450 MG CAPS, Take 1,350 mg by mouth daily. (Patient not taking: Reported on 08/24/2021), Disp: , Rfl:    tacrolimus (PROTOPIC) 0.1 % ointment, Apply topically 2 (two) times daily as needed. (Patient not taking: Reported on 08/24/2021), Disp: , Rfl:    tamsulosin (FLOMAX) 0.4 MG CAPS capsule, , Disp: , Rfl:   Physical exam:  Vitals:   03/25/22 1118  BP: 112/79  Pulse: 74  Resp: 18  Temp: 98.9 F (37.2 C)  SpO2: 97%  Weight: 223 lb 1.6 oz (101.2 kg)   Physical Exam Constitutional:      General: He is not in acute distress. Cardiovascular:     Rate and Rhythm: Normal rate and regular rhythm.     Heart sounds: Normal heart sounds.  Pulmonary:     Effort: Pulmonary effort is normal.     Breath sounds: Normal breath sounds.  Abdominal:     General: Bowel sounds are normal.     Palpations: Abdomen is soft.     Comments: No palpable hepatosplenomegaly  Lymphadenopathy:     Comments: No palpable cervical, supraclavicular, axillary or inguinal adenopathy    Skin:    General: Skin is warm and dry.  Neurological:     Mental Status: He is alert and oriented to person, place,  and time.         Latest Ref Rng & Units 03/25/2022   10:45 AM  CMP  Glucose 70 -  99 mg/dL 159   BUN 8 - 23 mg/dL 29   Creatinine 0.61 - 1.24 mg/dL 3.09   Sodium 135 - 145 mmol/L 138   Potassium 3.5 - 5.1 mmol/L 4.2   Chloride 98 - 111 mmol/L 107   CO2 22 - 32 mmol/L 26   Calcium 8.9 - 10.3 mg/dL 9.4   Total Protein 6.5 - 8.1 g/dL 7.3   Total Bilirubin 0.3 - 1.2 mg/dL 1.1   Alkaline Phos 38 - 126 U/L 83   AST 15 - 41 U/L 27   ALT 0 - 44 U/L 30       Latest Ref Rng & Units 03/25/2022   10:45 AM  CBC  WBC 4.0 - 10.5 K/uL 6.6   Hemoglobin 13.0 - 17.0 g/dL 18.4   Hematocrit 39.0 - 52.0 % 54.2   Platelets 150 - 400 K/uL 157     No images are attached to the encounter.  No results found.   Assessment and plan- Patient is a 76 y.o. male who is here for follow-up of following issues:  H. pylori negative MALT lymphoma: S/p IFRT in August 2021.  Subsequent EGD did not show any evidence of lymphoma in November 2021.  Patient still has on and off episodes of nausea and I would like him to see Dr. Tye Savoy.  He last saw her in January 2023.  Polycythemia: Discussed differences between P vera and secondary polycythemia.  We have done work-up for P vera today including JAK2 mutation testing and EPO levels.  If labs are not suggestive of polycythemia vera I would recommend that he should get sleep study through Dr. Ouida Sills to rule out sleep apnea as a cause of his polycythemia.  BC with differential in 3 months in 6 months and I will see him back in 6 months   Visit Diagnosis 1. Encounter for follow-up surveillance of lymphoma   2. Polycythemia      Dr. [name], MD, MPH [facility] at [facility] [phone] 03/25/2022 4:10 PM

## 2022-03-26 DIAGNOSIS — G7000 Myasthenia gravis without (acute) exacerbation: Secondary | ICD-10-CM | POA: Diagnosis not present

## 2022-03-26 DIAGNOSIS — E7800 Pure hypercholesterolemia, unspecified: Secondary | ICD-10-CM | POA: Diagnosis not present

## 2022-03-26 DIAGNOSIS — I700 Atherosclerosis of aorta: Secondary | ICD-10-CM | POA: Diagnosis not present

## 2022-03-26 DIAGNOSIS — N184 Chronic kidney disease, stage 4 (severe): Secondary | ICD-10-CM | POA: Diagnosis not present

## 2022-03-26 DIAGNOSIS — C884 Extranodal marginal zone B-cell lymphoma of mucosa-associated lymphoid tissue [MALT-lymphoma]: Secondary | ICD-10-CM | POA: Diagnosis not present

## 2022-03-26 DIAGNOSIS — E118 Type 2 diabetes mellitus with unspecified complications: Secondary | ICD-10-CM | POA: Diagnosis not present

## 2022-03-26 DIAGNOSIS — I129 Hypertensive chronic kidney disease with stage 1 through stage 4 chronic kidney disease, or unspecified chronic kidney disease: Secondary | ICD-10-CM | POA: Diagnosis not present

## 2022-03-26 DIAGNOSIS — E039 Hypothyroidism, unspecified: Secondary | ICD-10-CM | POA: Diagnosis not present

## 2022-03-26 LAB — ERYTHROPOIETIN: Erythropoietin: 14.1 m[IU]/mL (ref 2.6–18.5)

## 2022-04-01 LAB — CALR +MPL + E12-E15  (REFLEX)

## 2022-04-01 LAB — JAK2 V617F RFX CALR/MPL/E12-15

## 2022-06-06 DIAGNOSIS — E039 Hypothyroidism, unspecified: Secondary | ICD-10-CM | POA: Diagnosis not present

## 2022-06-06 DIAGNOSIS — I129 Hypertensive chronic kidney disease with stage 1 through stage 4 chronic kidney disease, or unspecified chronic kidney disease: Secondary | ICD-10-CM | POA: Diagnosis not present

## 2022-06-06 DIAGNOSIS — N184 Chronic kidney disease, stage 4 (severe): Secondary | ICD-10-CM | POA: Diagnosis not present

## 2022-06-06 DIAGNOSIS — E118 Type 2 diabetes mellitus with unspecified complications: Secondary | ICD-10-CM | POA: Diagnosis not present

## 2022-06-13 DIAGNOSIS — N184 Chronic kidney disease, stage 4 (severe): Secondary | ICD-10-CM | POA: Diagnosis not present

## 2022-06-13 DIAGNOSIS — E039 Hypothyroidism, unspecified: Secondary | ICD-10-CM | POA: Diagnosis not present

## 2022-06-13 DIAGNOSIS — G7000 Myasthenia gravis without (acute) exacerbation: Secondary | ICD-10-CM | POA: Diagnosis not present

## 2022-06-13 DIAGNOSIS — E7800 Pure hypercholesterolemia, unspecified: Secondary | ICD-10-CM | POA: Diagnosis not present

## 2022-06-13 DIAGNOSIS — I129 Hypertensive chronic kidney disease with stage 1 through stage 4 chronic kidney disease, or unspecified chronic kidney disease: Secondary | ICD-10-CM | POA: Diagnosis not present

## 2022-06-13 DIAGNOSIS — E118 Type 2 diabetes mellitus with unspecified complications: Secondary | ICD-10-CM | POA: Diagnosis not present

## 2022-06-25 ENCOUNTER — Inpatient Hospital Stay: Payer: PPO | Attending: Oncology

## 2022-06-25 DIAGNOSIS — Z79899 Other long term (current) drug therapy: Secondary | ICD-10-CM | POA: Insufficient documentation

## 2022-06-25 DIAGNOSIS — D751 Secondary polycythemia: Secondary | ICD-10-CM | POA: Insufficient documentation

## 2022-06-25 DIAGNOSIS — C8590 Non-Hodgkin lymphoma, unspecified, unspecified site: Secondary | ICD-10-CM | POA: Insufficient documentation

## 2022-06-25 LAB — CBC WITH DIFFERENTIAL/PLATELET
Abs Immature Granulocytes: 0.12 10*3/uL — ABNORMAL HIGH (ref 0.00–0.07)
Basophils Absolute: 0.1 10*3/uL (ref 0.0–0.1)
Basophils Relative: 1 %
Eosinophils Absolute: 0.2 10*3/uL (ref 0.0–0.5)
Eosinophils Relative: 2 %
HCT: 51.7 % (ref 39.0–52.0)
Hemoglobin: 17.7 g/dL — ABNORMAL HIGH (ref 13.0–17.0)
Immature Granulocytes: 2 %
Lymphocytes Relative: 12 %
Lymphs Abs: 0.9 10*3/uL (ref 0.7–4.0)
MCH: 33.3 pg (ref 26.0–34.0)
MCHC: 34.2 g/dL (ref 30.0–36.0)
MCV: 97.4 fL (ref 80.0–100.0)
Monocytes Absolute: 0.8 10*3/uL (ref 0.1–1.0)
Monocytes Relative: 10 %
Neutro Abs: 5.6 10*3/uL (ref 1.7–7.7)
Neutrophils Relative %: 73 %
Platelets: 166 10*3/uL (ref 150–400)
RBC: 5.31 MIL/uL (ref 4.22–5.81)
RDW: 13.5 % (ref 11.5–15.5)
WBC: 7.6 10*3/uL (ref 4.0–10.5)
nRBC: 0 % (ref 0.0–0.2)

## 2022-09-24 ENCOUNTER — Encounter: Payer: Self-pay | Admitting: Nurse Practitioner

## 2022-09-24 ENCOUNTER — Inpatient Hospital Stay (HOSPITAL_BASED_OUTPATIENT_CLINIC_OR_DEPARTMENT_OTHER): Payer: PPO | Admitting: Nurse Practitioner

## 2022-09-24 ENCOUNTER — Inpatient Hospital Stay: Payer: PPO | Attending: Oncology

## 2022-09-24 VITALS — BP 116/68 | HR 77 | Temp 97.9°F | Wt 224.0 lb

## 2022-09-24 DIAGNOSIS — Z8572 Personal history of non-Hodgkin lymphomas: Secondary | ICD-10-CM | POA: Diagnosis not present

## 2022-09-24 DIAGNOSIS — Z87442 Personal history of urinary calculi: Secondary | ICD-10-CM | POA: Diagnosis not present

## 2022-09-24 DIAGNOSIS — D751 Secondary polycythemia: Secondary | ICD-10-CM | POA: Diagnosis not present

## 2022-09-24 DIAGNOSIS — R739 Hyperglycemia, unspecified: Secondary | ICD-10-CM | POA: Diagnosis not present

## 2022-09-24 DIAGNOSIS — C884 Extranodal marginal zone B-cell lymphoma of mucosa-associated lymphoid tissue [MALT-lymphoma]: Secondary | ICD-10-CM | POA: Insufficient documentation

## 2022-09-24 DIAGNOSIS — Z825 Family history of asthma and other chronic lower respiratory diseases: Secondary | ICD-10-CM | POA: Diagnosis not present

## 2022-09-24 DIAGNOSIS — Z79899 Other long term (current) drug therapy: Secondary | ICD-10-CM | POA: Insufficient documentation

## 2022-09-24 DIAGNOSIS — Z85828 Personal history of other malignant neoplasm of skin: Secondary | ICD-10-CM | POA: Diagnosis not present

## 2022-09-24 DIAGNOSIS — Z8249 Family history of ischemic heart disease and other diseases of the circulatory system: Secondary | ICD-10-CM | POA: Insufficient documentation

## 2022-09-24 DIAGNOSIS — K449 Diaphragmatic hernia without obstruction or gangrene: Secondary | ICD-10-CM | POA: Diagnosis not present

## 2022-09-24 DIAGNOSIS — Z803 Family history of malignant neoplasm of breast: Secondary | ICD-10-CM | POA: Insufficient documentation

## 2022-09-24 DIAGNOSIS — Z8349 Family history of other endocrine, nutritional and metabolic diseases: Secondary | ICD-10-CM | POA: Insufficient documentation

## 2022-09-24 DIAGNOSIS — Z08 Encounter for follow-up examination after completed treatment for malignant neoplasm: Secondary | ICD-10-CM | POA: Diagnosis not present

## 2022-09-24 DIAGNOSIS — I129 Hypertensive chronic kidney disease with stage 1 through stage 4 chronic kidney disease, or unspecified chronic kidney disease: Secondary | ICD-10-CM | POA: Insufficient documentation

## 2022-09-24 DIAGNOSIS — N184 Chronic kidney disease, stage 4 (severe): Secondary | ICD-10-CM | POA: Diagnosis not present

## 2022-09-24 DIAGNOSIS — Z87891 Personal history of nicotine dependence: Secondary | ICD-10-CM | POA: Diagnosis not present

## 2022-09-24 DIAGNOSIS — N400 Enlarged prostate without lower urinary tract symptoms: Secondary | ICD-10-CM | POA: Insufficient documentation

## 2022-09-24 DIAGNOSIS — C8590 Non-Hodgkin lymphoma, unspecified, unspecified site: Secondary | ICD-10-CM | POA: Diagnosis present

## 2022-09-24 LAB — CBC WITH DIFFERENTIAL/PLATELET
Abs Immature Granulocytes: 0.08 10*3/uL — ABNORMAL HIGH (ref 0.00–0.07)
Basophils Absolute: 0 10*3/uL (ref 0.0–0.1)
Basophils Relative: 1 %
Eosinophils Absolute: 0.2 10*3/uL (ref 0.0–0.5)
Eosinophils Relative: 4 %
HCT: 52.8 % — ABNORMAL HIGH (ref 39.0–52.0)
Hemoglobin: 17.6 g/dL — ABNORMAL HIGH (ref 13.0–17.0)
Immature Granulocytes: 1 %
Lymphocytes Relative: 16 %
Lymphs Abs: 0.9 10*3/uL (ref 0.7–4.0)
MCH: 32.4 pg (ref 26.0–34.0)
MCHC: 33.3 g/dL (ref 30.0–36.0)
MCV: 97.2 fL (ref 80.0–100.0)
Monocytes Absolute: 0.8 10*3/uL (ref 0.1–1.0)
Monocytes Relative: 13 %
Neutro Abs: 3.7 10*3/uL (ref 1.7–7.7)
Neutrophils Relative %: 65 %
Platelets: 161 10*3/uL (ref 150–400)
RBC: 5.43 MIL/uL (ref 4.22–5.81)
RDW: 13.1 % (ref 11.5–15.5)
WBC: 5.7 10*3/uL (ref 4.0–10.5)
nRBC: 0 % (ref 0.0–0.2)

## 2022-09-24 LAB — COMPREHENSIVE METABOLIC PANEL
ALT: 35 U/L (ref 0–44)
AST: 30 U/L (ref 15–41)
Albumin: 4 g/dL (ref 3.5–5.0)
Alkaline Phosphatase: 88 U/L (ref 38–126)
Anion gap: 7 (ref 5–15)
BUN: 34 mg/dL — ABNORMAL HIGH (ref 8–23)
CO2: 25 mmol/L (ref 22–32)
Calcium: 9.2 mg/dL (ref 8.9–10.3)
Chloride: 103 mmol/L (ref 98–111)
Creatinine, Ser: 2.88 mg/dL — ABNORMAL HIGH (ref 0.61–1.24)
GFR, Estimated: 22 mL/min — ABNORMAL LOW (ref >60)
Glucose, Bld: 199 mg/dL — ABNORMAL HIGH (ref 70–99)
Potassium: 3.9 mmol/L (ref 3.5–5.1)
Sodium: 135 mmol/L (ref 135–145)
Total Bilirubin: 0.7 mg/dL (ref 0.3–1.2)
Total Protein: 7.2 g/dL (ref 6.5–8.1)

## 2022-09-24 LAB — LACTATE DEHYDROGENASE: LDH: 151 U/L (ref 98–192)

## 2022-09-24 NOTE — Progress Notes (Signed)
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  2.4 cm hypoattenuating renal lesion consistent with a cyst he underwent EGD which showed widely patent Schatzki's ring in the distal esophagus.  Small hiatal hernia.  Any area of abnormal mucosa with multifocal areas of ulceration with heaped up edges as well as multiple pigmented spots within the ulcers found in the anterior 1/Dasovich of the stomach.  It appears that there is any mass-effect/extrinsic compression of the stomach versus primary gastric malignancy.  No active bleeding as of recent bleeding.  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 Ki67 5%. No rearrangement of MALT 1 was observed H. pylori immunostain negative.    Repeat stool antigen H. pylori testing was also negative.he received EBRT to the involved area in August 2021.  Repeat endoscopy showed no evidence of lymphoma on EGD  Interval history- Patient is 31 yea rold male diagnosed with MALT lymphoma and polycythemia who returns to clinic for follow up.   ECOG PS-  1 Pain scale- 0   Review of systems- Review of Systems  Constitutional:  Positive for malaise/fatigue. Negative for chills, fever and weight loss.  HENT:  Negative for congestion, ear discharge and nosebleeds.   Eyes:  Negative for blurred vision.  Respiratory:  Negative for cough, hemoptysis, sputum production, shortness of breath and wheezing.   Cardiovascular:  Negative for chest pain, palpitations, orthopnea and claudication.  Gastrointestinal:  Positive for nausea. Negative for abdominal pain, blood in stool, constipation, diarrhea, heartburn, melena and vomiting.  Genitourinary:  Negative for dysuria, flank pain, frequency, hematuria and urgency.  Musculoskeletal:  Negative for back pain, joint pain and myalgias.  Skin:  Negative for rash.  Neurological:  Negative for dizziness, tingling, focal weakness, seizures, weakness and headaches.  Endo/Heme/Allergies:  Does not bruise/bleed easily.  Psychiatric/Behavioral:  Negative for depression and suicidal ideas. The patient does not have insomnia.       No Known Allergies   Past Medical History:  Diagnosis Date  . Basal cell carcinoma 2020   back of neck  . Cancer 1991   malignant melanoma  . Chronic kidney disease   . Essential hypertension   . Heart murmur   . History of diverticulitis 06/2014  . History of kidney stones   . Hyperlipidemia   . Iron deficiency anemia due to chronic blood loss 12/03/2019  . MALT lymphoma   . Membranous glomerulonephritis   . Myasthenia gravis   . Obesity      Past Surgical History:  Procedure Laterality Date  . ESOPHAGOGASTRODUODENOSCOPY (EGD) WITH PROPOFOL N/A 04/27/2020   Procedure: ESOPHAGOGASTRODUODENOSCOPY (EGD) WITH PROPOFOL;  Surgeon: Lin Landsman, MD;  Location: [facility] ENDOSCOPY;  Service: Gastroenterology;  Laterality: N/A;  . EYE SURGERY     cataract surgery done at [place]   . LITHOTRIPSY    . submandibular gland removal Left     Social History   Socioeconomic History   . Marital status: Married    Spouse name: Not on file  . Number of children: Not on file  . Years of education: Not on file  . Highest education level: High school graduate  Occupational History  . Not on file  Tobacco Use  . Smoking status: Former    Types: Cigarettes    Quit date: 05/14/1969    Years since quitting: 53.4  . Smokeless tobacco: Never  Vaping Use  . Vaping Use: Never used  Substance and Sexual Activity  . Alcohol use: No  . Drug use: No  . Sexual activity: Not Currently  Other Topics Concern  . Not on file  Social History Narrative  . Not on file   Social Determinants of Health   Financial Resource Strain: Low Risk  (12/18/2017)   Overall Financial Resource Strain (CARDIA)   . Difficulty of Paying Living Expenses: Not hard at all  Food Insecurity: No Food Insecurity (12/18/2017)   Hunger Vital Sign   . Worried About Charity fundraiser in the Last Year: Never true   . Ran Out of Food in the Last Year: Never true  Transportation Needs: No Transportation Needs (12/18/2017)   PRAPARE - Transportation   . Lack of Transportation (Medical): No   . Lack of Transportation (Non-Medical): No  Physical Activity: Insufficiently Active (12/21/2018)   Exercise Vital Sign   . Days of Exercise per Week: 3 days   . Minutes of Exercise per Session: 30 min  Stress: No Stress Concern Present (12/18/2017)   [place]   . Feeling of Stress : Not at all  Social Connections: Moderately Integrated (12/18/2017)   Social Connection and Isolation Panel [NHANES]   . Frequency of Communication with Friends and Family: More than three times a week   . Frequency of Social Gatherings with Friends and Family: More than three times a week   . Attends Religious Services: More than 4 times per year   . Active Member of Clubs or Organizations: No   . Attends Archivist Meetings: Never   . Marital Status: Married   Human resources officer Violence: Not At Risk (12/18/2017)   Humiliation, Afraid, Rape, and Kick questionnaire   . Fear of Current or Ex-Partner: No   . Emotionally Abused: No   . Physically Abused: No   . Sexually Abused: No    Family History  Problem Relation Age of Onset  . Hypertension Mother   . Thyroid disease Mother   . Breast cancer Mother   . Asthma Father      Current Outpatient Medications:  .  atorvastatin (LIPITOR) 80 MG tablet, Take 80 mg by mouth daily., Disp: , Rfl:  .  dutasteride (AVODART) 0.5 MG capsule, , Disp: , Rfl:  .  empagliflozin (JARDIANCE) 25 MG TABS tablet, Take 1 tablet by mouth daily., Disp: , Rfl:  .  famotidine (PEPCID) 10 MG tablet, Take 10 mg by mouth daily., Disp: , Rfl:  .  gabapentin (NEURONTIN) 300 MG capsule, Take by mouth., Disp: , Rfl:  .  levothyroxine (SYNTHROID) 175 MCG tablet, Take 175 mcg by mouth daily., Disp: , Rfl:  .  Nutritional Supplements (THERALITH XR  PO), Take by mouth 2 (two) times daily. Takes 2 tablets in AM and 2 tablets in PM, Disp: , Rfl:  .  ondansetron (ZOFRAN) 8 MG tablet, Take 1 tablet (8 mg total) by mouth every 8 (eight) hours as needed for nausea or vomiting., Disp: 30 tablet, Rfl: 1 .  pyridostigmine (MESTINON) 60 MG tablet, Take 1 tablet by mouth 2 (two) times daily., Disp: , Rfl:  .  triamcinolone cream (KENALOG) 0.1 %, APPLY SMALL AMOUNT TOPICALLY TWO TIMES A DAY AS NEEDED FOR RED RAISED ITCHY RASH. STOP WHEN SMOOTH. AVOID FACE, ARMPIT, AND GROIN, Disp: , Rfl:  .  albuterol (VENTOLIN HFA) 108 (90 Base) MCG/ACT inhaler, Inhale into the lungs., Disp: , Rfl:  .  Cholecalciferol (VITAMIN D3 ULTRA STRENGTH) 125 MCG (5000 UT) capsule, Take 5,000 Units by mouth daily. (Patient not taking: Reported on 08/24/2021), Disp: , Rfl:  .  diltiazem (CARDIZEM SR) 120 MG 12 hr capsule, TAKE 1 CAPSULE BY MOUTH EVERY 24 HOURS (Patient not taking: Reported on 08/24/2021), Disp: , Rfl:  .  ferrous sulfate 325 (65 FE) MG tablet, Take 1 tablet by  mouth daily with breakfast. (Patient not taking: Reported on 08/24/2021), Disp: , Rfl:  .  Omega-3 Fatty Acids (OMEGA 3 PO), Take by mouth 2 (two) times daily. (Patient not taking: Reported on 03/25/2022), Disp: , Rfl:  .  omeprazole (PRILOSEC) 40 MG capsule, Take 1 capsule (40 mg total) by mouth daily before breakfast. (Patient not taking: Reported on 03/25/2022), Disp: 30 capsule, Rfl: 0 .  promethazine-dextromethorphan (PROMETHAZINE-DM) 6.25-15 MG/5ML syrup, Take 5 mLs by mouth every 4 (four) hours as needed. (Patient not taking: Reported on 08/24/2021), Disp: , Rfl:  .  Saw [place], Serenoa repens, 450 MG CAPS, Take 1,350 mg by mouth daily. (Patient not taking: Reported on 08/24/2021), Disp: , Rfl:  .  tacrolimus (PROTOPIC) 0.1 % ointment, Apply topically 2 (two) times daily as needed. (Patient not taking: Reported on 08/24/2021), Disp: , Rfl:  .  tamsulosin (FLOMAX) 0.4 MG CAPS capsule, , Disp: , Rfl:   Physical exam:  Vitals:   09/24/22 1016  BP: 116/68  Pulse: 77  Temp: 97.9 F (36.6 C)  TempSrc: Oral  SpO2: 95%  Weight: 224 lb (101.6 kg)   Physical Exam Constitutional:      General: He is not in acute distress. Cardiovascular:     Rate and Rhythm: Normal rate and regular rhythm.     Heart sounds: Normal heart sounds.  Pulmonary:     Effort: Pulmonary effort is normal.     Breath sounds: Normal breath sounds.  Abdominal:     General: Bowel sounds are normal.     Palpations: Abdomen is soft.     Comments: No palpable hepatosplenomegaly  Lymphadenopathy:     Comments: No palpable cervical, supraclavicular, axillary or inguinal adenopathy    Skin:    General: Skin is warm and dry.  Neurological:     Mental Status: He is alert and oriented to person, place, and time.        Latest Ref Rng & Units 09/24/2022    9:54 AM  CMP  Glucose 70 - 99 mg/dL 199   BUN 8 - 23 mg/dL 34   Creatinine 0.61 - 1.24 mg/dL 2.88   Sodium 135 - 145 mmol/L 135   Potassium 3.5 - 5.1 mmol/L 3.9    Chloride 98 - 111 mmol/L 103   CO2 22 - 32 mmol/L 25   Calcium 8.9 - 10.3  mg/dL 9.2   Total Protein 6.5 - 8.1 g/dL 7.2   Total Bilirubin 0.3 - 1.2 mg/dL 0.7   Alkaline Phos 38 - 126 U/L 88   AST 15 - 41 U/L 30   ALT 0 - 44 U/L 35       Latest Ref Rng & Units 09/24/2022    9:54 AM  CBC  WBC 4.0 - 10.5 K/uL 5.7   Hemoglobin 13.0 - 17.0 g/dL 17.6   Hematocrit 39.0 - 52.0 % 52.8   Platelets 150 - 400 K/uL 161     No images are attached to the encounter.  No results found.   Assessment and plan- Patient is a 77 y.o. male who is here for follow-up of following issues:  H. pylori negative MALT lymphoma: S/p IFRT in August 2021.  Subsequent EGD did not show any evidence of lymphoma in November 2021.  Patient still has on and off episodes of nausea and I would like him to see Dr. Marius Ditch again.  He last saw her in January 2023. Polycythemia: Discussed differences between P vera and secondary polycythemia.  We have done work-up for P vera today including JAK2 mutation testing and EPO levels.  If labs are not suggestive of polycythemia vera I would recommend that he should get sleep study through Dr. Ouida Sills to rule out sleep apnea as a cause of his polycythemia. CKD- GFR 20. Stage IV. No role for iron or epo at this time given elevated hemoglobin levels.  Hyperglycemia- Glucose 199.      Visit Diagnosis No diagnosis found.  Beckey Rutter, DNP, AGNP-C, [place] at [facility] [phone] (clinic) 09/24/2022

## 2022-09-25 ENCOUNTER — Telehealth: Payer: Self-pay

## 2022-09-25 DIAGNOSIS — D751 Secondary polycythemia: Secondary | ICD-10-CM | POA: Insufficient documentation

## 2022-09-25 MED ORDER — ASPIRIN 81 MG PO TBEC
81.0000 mg | DELAYED_RELEASE_TABLET | Freq: Every day | ORAL | 3 refills | Status: DC
Start: 2022-09-25 — End: 2022-10-22

## 2022-09-25 NOTE — Telephone Encounter (Signed)
We receive a referral for Encounter for follow-up surveillance of lymphoma  needs a EGD. You did EGD on patient on 04/27/2020 and saw patient in the office on 07/23/2021. Do you want to see patient or schedule a EGD

## 2022-09-30 ENCOUNTER — Other Ambulatory Visit: Payer: Self-pay

## 2022-09-30 DIAGNOSIS — Z8572 Personal history of non-Hodgkin lymphomas: Secondary | ICD-10-CM

## 2022-09-30 NOTE — Telephone Encounter (Signed)
Okay to schedule EGD directly  RV

## 2022-09-30 NOTE — Telephone Encounter (Signed)
Called patient and got patient schedule for a EGD on 10/22/2022. Went over instructions, mailed them and sent to [facility] account

## 2022-10-22 ENCOUNTER — Encounter
Admission: RE | Disposition: A | Discharge status: Home / Self Care | Payer: Self-pay | Source: Home / Self Care | Attending: Gastroenterology

## 2022-10-22 ENCOUNTER — Ambulatory Visit: Payer: PPO | Admitting: Anesthesiology

## 2022-10-22 ENCOUNTER — Other Ambulatory Visit: Payer: Self-pay

## 2022-10-22 ENCOUNTER — Encounter: Payer: Self-pay | Admitting: Gastroenterology

## 2022-10-22 ENCOUNTER — Ambulatory Visit
Admission: RE | Admit: 2022-10-22 | Discharge: 2022-10-22 | Disposition: A | Discharge status: Home / Self Care | Payer: PPO | Attending: Gastroenterology | Admitting: Gastroenterology

## 2022-10-22 DIAGNOSIS — Z7984 Long term (current) use of oral hypoglycemic drugs: Secondary | ICD-10-CM | POA: Insufficient documentation

## 2022-10-22 DIAGNOSIS — K3189 Other diseases of stomach and duodenum: Secondary | ICD-10-CM | POA: Diagnosis not present

## 2022-10-22 DIAGNOSIS — K259 Gastric ulcer, unspecified as acute or chronic, without hemorrhage or perforation: Secondary | ICD-10-CM

## 2022-10-22 DIAGNOSIS — K254 Chronic or unspecified gastric ulcer with hemorrhage: Secondary | ICD-10-CM | POA: Insufficient documentation

## 2022-10-22 DIAGNOSIS — K2970 Gastritis, unspecified, without bleeding: Secondary | ICD-10-CM | POA: Diagnosis not present

## 2022-10-22 DIAGNOSIS — Z08 Encounter for follow-up examination after completed treatment for malignant neoplasm: Secondary | ICD-10-CM | POA: Diagnosis not present

## 2022-10-22 DIAGNOSIS — K449 Diaphragmatic hernia without obstruction or gangrene: Secondary | ICD-10-CM | POA: Diagnosis not present

## 2022-10-22 DIAGNOSIS — E1122 Type 2 diabetes mellitus with diabetic chronic kidney disease: Secondary | ICD-10-CM | POA: Insufficient documentation

## 2022-10-22 DIAGNOSIS — E785 Hyperlipidemia, unspecified: Secondary | ICD-10-CM | POA: Insufficient documentation

## 2022-10-22 DIAGNOSIS — Z8572 Personal history of non-Hodgkin lymphomas: Secondary | ICD-10-CM

## 2022-10-22 DIAGNOSIS — K219 Gastro-esophageal reflux disease without esophagitis: Secondary | ICD-10-CM | POA: Diagnosis not present

## 2022-10-22 DIAGNOSIS — R110 Nausea: Secondary | ICD-10-CM | POA: Diagnosis not present

## 2022-10-22 DIAGNOSIS — N184 Chronic kidney disease, stage 4 (severe): Secondary | ICD-10-CM | POA: Diagnosis not present

## 2022-10-22 DIAGNOSIS — Z87891 Personal history of nicotine dependence: Secondary | ICD-10-CM | POA: Insufficient documentation

## 2022-10-22 DIAGNOSIS — K2270 Barrett's esophagus without dysplasia: Secondary | ICD-10-CM | POA: Insufficient documentation

## 2022-10-22 DIAGNOSIS — K2289 Other specified disease of esophagus: Secondary | ICD-10-CM | POA: Diagnosis not present

## 2022-10-22 DIAGNOSIS — G7000 Myasthenia gravis without (acute) exacerbation: Secondary | ICD-10-CM | POA: Insufficient documentation

## 2022-10-22 DIAGNOSIS — N189 Chronic kidney disease, unspecified: Secondary | ICD-10-CM | POA: Insufficient documentation

## 2022-10-22 DIAGNOSIS — Z09 Encounter for follow-up examination after completed treatment for conditions other than malignant neoplasm: Secondary | ICD-10-CM | POA: Insufficient documentation

## 2022-10-22 DIAGNOSIS — I129 Hypertensive chronic kidney disease with stage 1 through stage 4 chronic kidney disease, or unspecified chronic kidney disease: Secondary | ICD-10-CM | POA: Insufficient documentation

## 2022-10-22 DIAGNOSIS — Z85828 Personal history of other malignant neoplasm of skin: Secondary | ICD-10-CM | POA: Diagnosis not present

## 2022-10-22 DIAGNOSIS — D631 Anemia in chronic kidney disease: Secondary | ICD-10-CM | POA: Diagnosis not present

## 2022-10-22 HISTORY — PX: ESOPHAGOGASTRODUODENOSCOPY (EGD) WITH PROPOFOL: SHX5813

## 2022-10-22 LAB — GLUCOSE, CAPILLARY: Glucose-Capillary: 148 mg/dL — ABNORMAL HIGH (ref 70–99)

## 2022-10-22 SURGERY — ESOPHAGOGASTRODUODENOSCOPY (EGD) WITH PROPOFOL
Anesthesia: General

## 2022-10-22 MED ORDER — PROPOFOL 10 MG/ML IV BOLUS
INTRAVENOUS | Status: DC | PRN
  Administered 2022-10-22: 60 mg via INTRAVENOUS
  Administered 2022-10-22: 20 mg via INTRAVENOUS

## 2022-10-22 MED ORDER — SODIUM CHLORIDE 0.9 % IV SOLN: INTRAVENOUS | Status: DC

## 2022-10-22 MED ORDER — PROPOFOL 1000 MG/100ML IV EMUL
INTRAVENOUS | Status: AC
  Filled 2022-10-22: qty 100

## 2022-10-22 MED ORDER — LIDOCAINE HCL (CARDIAC) PF 100 MG/5ML IV SOSY
PREFILLED_SYRINGE | INTRAVENOUS | Status: DC | PRN
  Administered 2022-10-22: 50 mg via INTRAVENOUS

## 2022-10-22 MED ORDER — PROPOFOL 500 MG/50ML IV EMUL
INTRAVENOUS | Status: DC | PRN
  Administered 2022-10-22: 150 ug/kg/min via INTRAVENOUS

## 2022-10-22 NOTE — Transfer of Care (Signed)
Immediate Anesthesia Transfer of Care Note  Patient: Bill Aguilar  Procedure(s) Performed: ESOPHAGOGASTRODUODENOSCOPY (EGD) WITH PROPOFOL  Patient Location: Endoscopy Unit  Anesthesia Type:General  Level of Consciousness: sedated, patient cooperative, and responds to stimulation  Airway & Oxygen Therapy: Patient Spontanous Breathing and Patient connected to nasal cannula oxygen  Post-op Assessment: Report given to RN and Post -op Vital signs reviewed and stable  Post vital signs: Reviewed and stable  Last Vitals:  Vitals Value Taken Time  BP 103/70 10/22/22 0806  Temp 35.6 C 10/22/22 0806  Pulse 85 10/22/22 0807  Resp 21 10/22/22 0807  SpO2 95 % 10/22/22 0807  Vitals shown include unvalidated device data.  Last Pain:  Vitals:   10/22/22 0806  TempSrc: Temporal  PainSc: Asleep         Complications: No notable events documented.

## 2022-10-22 NOTE — Op Note (Signed)
[facility] Patient Name: Bill Aguilar Procedure Date: 10/22/2022 7:04 AM MRN: [id number] Account #: [id number] Date of Birth: Jan 14, 1946 Admit Type: Outpatient Age: 77 Room: [facility] ENDO ROOM 3 Gender: Male Note Status: Finalized Instrument Name: Upper Endoscope [id number] Procedure:             Upper GI endoscopy Indications:           Nausea Providers:             Toney Reil MD, MD Referring MD:          Steele Sizer, MD (Referring MD) Medicines:             General Anesthesia Complications:         No immediate complications. Estimated blood loss: None. Procedure:             Pre-Anesthesia Assessment:                        - Prior to the procedure, a History and Physical was                         performed, and patient medications and allergies were                         reviewed. The patient is competent. The risks and                         benefits of the procedure and the sedation options and                         risks were discussed with the patient. All questions                         were answered and informed consent was obtained.                         Patient identification and proposed procedure were                         verified by the physician, the nurse, the                         anesthesiologist, the anesthetist and the technician                         in the pre-procedure area in the procedure room in the                         endoscopy suite. Mental Status Examination: alert and                         oriented. Airway Examination: normal oropharyngeal                         airway and neck mobility. Respiratory Examination:                         clear to auscultation. CV Examination: normal.  Prophylactic Antibiotics: The patient does not require                         prophylactic antibiotics. Prior Anticoagulants: The                         patient has taken no  anticoagulant or antiplatelet                         agents. ASA Grade Assessment: III - A patient with                         severe systemic disease. After reviewing the risks and                         benefits, the patient was deemed in satisfactory                         condition to undergo the procedure. The anesthesia                         plan was to use general anesthesia. Immediately prior                         to administration of medications, the patient was                         re-assessed for adequacy to receive sedatives. The                         heart rate, respiratory rate, oxygen saturations,                         blood pressure, adequacy of pulmonary ventilation, and                         response to care were monitored throughout the                         procedure. The physical status of the patient was                         re-assessed after the procedure.                        After obtaining informed consent, the endoscope was                         passed under direct vision. Throughout the procedure,                         the patient's blood pressure, pulse, and oxygen                         saturations were monitored continuously. The Endoscope                         was introduced through the mouth, and advanced to the  second part of duodenum. The upper GI endoscopy was                         accomplished without difficulty. The patient tolerated                         the procedure well. Findings:      The duodenal bulb and second portion of the duodenum were normal.      Multiple dispersed diminutive erosions with stigmata of recent bleeding       were found in the entire examined stomach. Biopsies were taken with a       cold forceps for histology.      The cardia and gastric fundus were normal on retroflexion.      A small hiatal hernia was present.      Esophagogastric landmarks were identified: the  gastroesophageal junction       was found at 40 cm from the incisors.      Circumferential salmon-colored mucosa was present from 39 to 40 cm. No       other visible abnormalities were present. The maximum longitudinal       extent of these esophageal mucosal changes was 1 cm in length. Biopsies       were taken with a cold forceps for histology. Impression:            - Normal duodenal bulb and second portion of the                         duodenum.                        - Erosive gastropathy with stigmata of recent                         bleeding. Biopsied.                        - Small hiatal hernia.                        - Esophagogastric landmarks identified.                        - Salmon-colored mucosa suspicious for short-segment                         Barrett's esophagus. Biopsied. Recommendation:        - Await pathology results.                        - Discharge patient to home (with escort).                        - Cardiac diet and diabetic (ADA) diet.                        - Continue present medications.                        - Follow an antireflux regimen. Procedure Code(s):     --- Professional ---  16109, Esophagogastroduodenoscopy, flexible,                         transoral; with biopsy, single or multiple Diagnosis Code(s):     --- Professional ---                        K22.89, Other specified disease of esophagus                        K92.2, Gastrointestinal hemorrhage, unspecified                        R11.0, Nausea                        K44.9, Diaphragmatic hernia without obstruction or                         gangrene CPT copyright 2022 American Medical Association. All rights reserved. The codes documented in this report are preliminary and upon coder review may  be revised to meet current compliance requirements. Dr. Libby Maw Toney Reil MD, MD 10/22/2022 8:05:25 AM This report has been signed  electronically. Number of Addenda: 0 Note Initiated On: 10/22/2022 7:04 AM Estimated Blood Loss:  Estimated blood loss: none.      [facility]

## 2022-10-22 NOTE — Anesthesia Procedure Notes (Signed)
Date/Time: 10/22/2022 7:51 AM  Performed by: Ginger Carne, CRNAPre-anesthesia Checklist: Patient identified, Emergency Drugs available, Suction available, Patient being monitored and Timeout performed Patient Re-evaluated:Patient Re-evaluated prior to induction Oxygen Delivery Method: Nasal cannula Preoxygenation: Pre-oxygenation with 100% oxygen Induction Type: IV induction

## 2022-10-22 NOTE — Anesthesia Postprocedure Evaluation (Signed)
Anesthesia Post Note  Patient: Bill Aguilar  Procedure(s) Performed: ESOPHAGOGASTRODUODENOSCOPY (EGD) WITH PROPOFOL  Patient location during evaluation: Endoscopy Anesthesia Type: General Level of consciousness: awake and alert Pain management: pain level controlled Vital Signs Assessment: post-procedure vital signs reviewed and stable Respiratory status: spontaneous breathing, nonlabored ventilation, respiratory function stable and patient connected to nasal cannula oxygen Cardiovascular status: blood pressure returned to baseline and stable Postop Assessment: no apparent nausea or vomiting Anesthetic complications: no   No notable events documented.   Last Vitals:  Vitals:   10/22/22 0806 10/22/22 0826  BP: 103/70 111/71  Pulse: 86   Resp: 14   Temp: (!) 35.6 C   SpO2: 94%     Last Pain:  Vitals:   10/22/22 0826  TempSrc:   PainSc: 0-No pain                 Corinda Gubler

## 2022-10-22 NOTE — Anesthesia Preprocedure Evaluation (Signed)
Anesthesia Evaluation  Patient identified by MRN, date of birth, ID band Patient awake    Reviewed: Allergy & Precautions, NPO status , Patient's Chart, lab work & pertinent test results  History of Anesthesia Complications Negative for: history of anesthetic complications  Airway Mallampati: II  TM Distance: >3 FB Neck ROM: Full    Dental no notable dental hx. (+) Teeth Intact   Pulmonary neg pulmonary ROS, neg sleep apnea, neg COPD, Patient abstained from smoking.Not current smoker, former smoker   Pulmonary exam normal breath sounds clear to auscultation       Cardiovascular Exercise Tolerance: Poor METShypertension, Pt. on medications + DOE  (-) CAD and (-) Past MI (-) dysrhythmias  Rhythm:Regular Rate:Normal - Systolic murmurs TTE 2022 unremarkable   Neuro/Psych  Neuromuscular disease  negative psych ROS   GI/Hepatic ,GERD  Medicated,,(+)     (-) substance abuse    Endo/Other  diabetes    Renal/GU CRFRenal disease     Musculoskeletal   Abdominal   Peds  Hematology   Anesthesia Other Findings Past Medical History: 2020: Basal cell carcinoma     Comment:  back of neck 1991: Cancer (HCC)     Comment:  malignant melanoma No date: Chronic kidney disease 2023: Diabetes (HCC) No date: Essential hypertension No date: Heart murmur 06/2014: History of diverticulitis No date: History of kidney stones No date: Hyperlipidemia 12/03/2019: Iron deficiency anemia due to chronic blood loss 2021: MALT lymphoma (HCC) No date: Membranous glomerulonephritis No date: Myasthenia gravis (HCC) No date: Obesity  Reproductive/Obstetrics                             Anesthesia Physical Anesthesia Plan  ASA: 3  Anesthesia Plan: General   Post-op Pain Management: Minimal or no pain anticipated   Induction: Intravenous  PONV Risk Score and Plan: 2 and Propofol infusion, TIVA and  Ondansetron  Airway Management Planned: Nasal Cannula  Additional Equipment: None  Intra-op Plan:   Post-operative Plan:   Informed Consent: I have reviewed the patients History and Physical, chart, labs and discussed the procedure including the risks, benefits and alternatives for the proposed anesthesia with the patient or authorized representative who has indicated his/her understanding and acceptance.     Dental advisory given  Plan Discussed with: CRNA and Surgeon  Anesthesia Plan Comments: (Discussed risks of anesthesia with patient, including possibility of difficulty with spontaneous ventilation under anesthesia necessitating airway intervention, PONV, and rare risks such as cardiac or respiratory or neurological events, and allergic reactions. Discussed the role of CRNA in patient's perioperative care. Patient understands.)       Anesthesia Quick Evaluation

## 2022-10-22 NOTE — H&P (Signed)
Arlyss Repress, MD [address]  [address]  [place], [place] [address]  Main: [phone]  Fax: [phone] Pager: [phone]  Primary Care Physician:  Lauro Regulus, MD Primary Gastroenterologist:  Dr. Arlyss Repress  Pre-Procedure History & Physical: HPI:  Bill Aguilar is a 77 y.o. male is here for an endoscopy.   Past Medical History:  Diagnosis Date   Basal cell carcinoma 2020   back of neck   Cancer (HCC) 1991   malignant melanoma   Chronic kidney disease    Diabetes (HCC) 2023   Essential hypertension    Heart murmur    History of diverticulitis 06/2014   History of kidney stones    Hyperlipidemia    Iron deficiency anemia due to chronic blood loss 12/03/2019   MALT lymphoma (HCC) 2021   Membranous glomerulonephritis    Myasthenia gravis (HCC)    Obesity     Past Surgical History:  Procedure Laterality Date   ESOPHAGOGASTRODUODENOSCOPY (EGD) WITH PROPOFOL N/A 04/27/2020   Procedure: ESOPHAGOGASTRODUODENOSCOPY (EGD) WITH PROPOFOL;  Surgeon: Toney Reil, MD;  Location: [facility] ENDOSCOPY;  Service: Gastroenterology;  Laterality: N/A;   EYE SURGERY     cataract surgery done at [facility]    LITHOTRIPSY     submandibular gland removal Left     Prior to Admission medications   Medication Sig Start Date End Date Taking? Authorizing Provider  atorvastatin (LIPITOR) 80 MG tablet Take 80 mg by mouth daily.   Yes [provider]  gabapentin (NEURONTIN) 300 MG capsule Take by mouth.   Yes [provider]  levothyroxine (SYNTHROID) 175 MCG tablet Take 175 mcg by mouth daily. 07/17/21  Yes [provider]  pyridostigmine (MESTINON) 60 MG tablet Take 1 tablet by mouth 2 (two) times daily. 09/11/20  Yes [provider]  albuterol (VENTOLIN HFA) 108 (90 Base) MCG/ACT inhaler Inhale into the lungs. 11/16/20 03/25/22  [provider]  aspirin EC 81 MG tablet Take 1 tablet (81 mg total) by mouth daily. Swallow  whole. Patient not taking: Reported on 10/22/2022 09/25/22   Alinda Dooms, NP  Cholecalciferol (VITAMIN D3 ULTRA STRENGTH) 125 MCG (5000 UT) capsule Take 5,000 Units by mouth daily. Patient not taking: Reported on 08/24/2021    [provider]  diltiazem (CARDIZEM SR) 120 MG 12 hr capsule TAKE 1 CAPSULE BY MOUTH EVERY 24 HOURS Patient not taking: Reported on 08/24/2021 09/11/20   [provider]  dutasteride (AVODART) 0.5 MG capsule  09/11/20   [provider]  empagliflozin (JARDIANCE) 25 MG TABS tablet Take 1 tablet by mouth daily. 05/22/21   [provider]  famotidine (PEPCID) 10 MG tablet Take 10 mg by mouth daily.    [provider]  ferrous sulfate 325 (65 FE) MG tablet Take 1 tablet by mouth daily with breakfast. Patient not taking: Reported on 08/24/2021    [provider]  Nutritional Supplements (THERALITH XR PO) Take by mouth 2 (two) times daily. Takes 2 tablets in AM and 2 tablets in PM    [provider]  Omega-3 Fatty Acids (OMEGA 3 PO) Take by mouth 2 (two) times daily. Patient not taking: Reported on 03/25/2022    [provider]  omeprazole (PRILOSEC) 40 MG capsule Take 1 capsule (40 mg total) by mouth daily before breakfast. Patient not taking: Reported on 03/25/2022 06/28/21 08/24/21  Toney Reil, MD  ondansetron (ZOFRAN) 8 MG tablet Take 1 tablet (8 mg total) by mouth every  8 (eight) hours as needed for nausea or vomiting. 07/23/21   Katelinn Justice, Loel Dubonnet, MD  promethazine-dextromethorphan (PROMETHAZINE-DM) 6.25-15 MG/5ML syrup Take 5 mLs by mouth every 4 (four) hours as needed. Patient not taking: Reported on 08/24/2021 11/16/20   [provider]  Saw Palmetto, Serenoa repens, 450 MG CAPS Take 1,350 mg by mouth daily. Patient not taking: Reported on 08/24/2021    [provider]  tacrolimus (PROTOPIC) 0.1 % ointment Apply topically 2 (two) times daily as needed. Patient not taking: Reported on  08/24/2021 02/14/21   [provider]  tamsulosin (FLOMAX) 0.4 MG CAPS capsule  02/18/20   [provider]  triamcinolone cream (KENALOG) 0.1 % APPLY SMALL AMOUNT TOPICALLY TWO TIMES A DAY AS NEEDED FOR RED RAISED ITCHY RASH. STOP WHEN SMOOTH. AVOID FACE, ARMPIT, AND GROIN 05/03/21   [provider]    Allergies as of 09/30/2022   (No Known Allergies)    Family History  Problem Relation Age of Onset   Hypertension Mother    Thyroid disease Mother    Breast cancer Mother    Asthma Father     Social History   Socioeconomic History   Marital status: Married    Spouse name: Not on file   Number of children: Not on file   Years of education: Not on file   Highest education level: High school graduate  Occupational History   Not on file  Tobacco Use   Smoking status: Former    Types: Cigarettes    Quit date: 05/14/1969    Years since quitting: 53.4   Smokeless tobacco: Never  Vaping Use   Vaping Use: Never used  Substance and Sexual Activity   Alcohol use: No   Drug use: No   Sexual activity: Not Currently  Other Topics Concern   Not on file  Social History Narrative   Not on file   Social Determinants of Health   Financial Resource Strain: Low Risk  (12/18/2017)   Overall Financial Resource Strain (CARDIA)    Difficulty of Paying Living Expenses: Not hard at all  Food Insecurity: No Food Insecurity (12/18/2017)   Hunger Vital Sign    Worried About Running Out of Food in the Last Year: Never true    Ran Out of Food in the Last Year: Never true  Transportation Needs: No Transportation Needs (12/18/2017)   PRAPARE - Administrator, Civil Service (Medical): No    Lack of Transportation (Non-Medical): No  Physical Activity: Insufficiently Active (12/21/2018)   Exercise Vital Sign    Days of Exercise per Week: 3 days    Minutes of Exercise per Session: 30 min  Stress: No Stress Concern Present (12/18/2017)   [employer] of  Occupational Health - Occupational Stress Questionnaire    Feeling of Stress : Not at all  Social Connections: Moderately Integrated (12/18/2017)   Social Connection and Isolation Panel [NHANES]    Frequency of Communication with Friends and Family: More than three times a week    Frequency of Social Gatherings with Friends and Family: More than three times a week    Attends Religious Services: More than 4 times per year    Active Member of [employer] or Organizations: No    Attends Banker Meetings: Never    Marital Status: Married  Catering manager Violence: Not At Risk (12/18/2017)   Humiliation, Afraid, Rape, and Kick questionnaire    Fear of Current or Ex-Partner: No    Emotionally  Abused: No    Physically Abused: No    Sexually Abused: No    Review of Systems: See HPI, otherwise negative ROS  Physical Exam: BP 136/74   Pulse 71   Temp (!) 96.5 F (35.8 C) (Temporal)   Resp 16   Ht 5\' 7"  (1.702 m)   Wt 98.9 kg   SpO2 98%   BMI 34.14 kg/m  General:   Alert,  pleasant and cooperative in NAD Head:  Normocephalic and atraumatic. Neck:  Supple; no masses or thyromegaly. Lungs:  Clear throughout to auscultation.    Heart:  Regular rate and rhythm. Abdomen:  Soft, nontender and nondistended. Normal bowel sounds, without guarding, and without rebound.   Neurologic:  Alert and  oriented x4;  grossly normal neurologically.  Impression/Plan: Bill Aguilar is here for an endoscopy to be performed for chronic nausea  Risks, benefits, limitations, and alternatives regarding upper endoscopy have been reviewed with the patient.  Questions have been answered.  All parties agreeable.   Lannette Donath, MD  10/22/2022, 7:48 AM

## 2022-10-23 ENCOUNTER — Encounter: Payer: Self-pay | Admitting: Gastroenterology

## 2022-10-23 LAB — SURGICAL PATHOLOGY

## 2022-10-25 DIAGNOSIS — N184 Chronic kidney disease, stage 4 (severe): Secondary | ICD-10-CM | POA: Diagnosis not present

## 2022-10-25 DIAGNOSIS — E118 Type 2 diabetes mellitus with unspecified complications: Secondary | ICD-10-CM | POA: Diagnosis not present

## 2022-10-25 DIAGNOSIS — I129 Hypertensive chronic kidney disease with stage 1 through stage 4 chronic kidney disease, or unspecified chronic kidney disease: Secondary | ICD-10-CM | POA: Diagnosis not present

## 2022-10-25 DIAGNOSIS — E7800 Pure hypercholesterolemia, unspecified: Secondary | ICD-10-CM | POA: Diagnosis not present

## 2022-10-29 ENCOUNTER — Encounter: Payer: Self-pay | Admitting: Gastroenterology

## 2022-10-29 ENCOUNTER — Telehealth: Payer: Self-pay

## 2022-10-29 MED ORDER — OMEPRAZOLE 40 MG PO CPDR: 40.0000 mg | DELAYED_RELEASE_CAPSULE | Freq: Every day | ORAL | 0 refills | Status: AC

## 2022-10-29 NOTE — Telephone Encounter (Signed)
-----   Message from Toney Reil, MD sent at 10/28/2022 10:18 PM EDT ----- Bill Aguilar  Please inform patient that the pathology results confirm he has reflux esophagitis.  He also had erosions in his stomach during upper endoscopy.  These findings could explain his chronic intermittent nausea.  Recommend trial of omeprazole 40 mg once a day before breakfast for 1 month.  Discontinue famotidine.  He should let us know if omeprazole relieves his nausea  Rohini Vanga

## 2022-10-29 NOTE — Telephone Encounter (Signed)
Patient verbalized understanding of results. He will stop the Famotidine and start the omeprazole. Sent the omeprazole to the pharmacy

## 2022-10-30 DIAGNOSIS — I129 Hypertensive chronic kidney disease with stage 1 through stage 4 chronic kidney disease, or unspecified chronic kidney disease: Secondary | ICD-10-CM | POA: Diagnosis not present

## 2022-10-30 DIAGNOSIS — N052 Unspecified nephritic syndrome with diffuse membranous glomerulonephritis: Secondary | ICD-10-CM | POA: Diagnosis not present

## 2022-10-30 DIAGNOSIS — Z0000 Encounter for general adult medical examination without abnormal findings: Secondary | ICD-10-CM | POA: Diagnosis not present

## 2022-10-30 DIAGNOSIS — E118 Type 2 diabetes mellitus with unspecified complications: Secondary | ICD-10-CM | POA: Diagnosis not present

## 2022-10-30 DIAGNOSIS — G7000 Myasthenia gravis without (acute) exacerbation: Secondary | ICD-10-CM | POA: Diagnosis not present

## 2022-10-30 DIAGNOSIS — E039 Hypothyroidism, unspecified: Secondary | ICD-10-CM | POA: Diagnosis not present

## 2022-10-30 DIAGNOSIS — C884 Extranodal marginal zone B-cell lymphoma of mucosa-associated lymphoid tissue [MALT-lymphoma]: Secondary | ICD-10-CM | POA: Diagnosis not present

## 2022-10-30 DIAGNOSIS — N184 Chronic kidney disease, stage 4 (severe): Secondary | ICD-10-CM | POA: Diagnosis not present

## 2022-10-30 DIAGNOSIS — N2581 Secondary hyperparathyroidism of renal origin: Secondary | ICD-10-CM | POA: Diagnosis not present

## 2022-10-30 DIAGNOSIS — I700 Atherosclerosis of aorta: Secondary | ICD-10-CM | POA: Diagnosis not present

## 2022-12-02 ENCOUNTER — Telehealth: Payer: Self-pay | Admitting: *Deleted

## 2022-12-02 NOTE — Telephone Encounter (Signed)
Patient called stating that his PCP at the [place] wants to put him on Ozempic for his diabetes and wanted to check with Dr Smith Robert to see if she is ok with that. Please advise

## 2022-12-02 NOTE — Telephone Encounter (Signed)
No contrandications from my side

## 2022-12-02 NOTE — Telephone Encounter (Signed)
CAll returned to patient and informed per Dr Smith Robert that she has no contraindications for him to start Ozempic. He thanked me for calling

## 2023-01-01 ENCOUNTER — Ambulatory Visit: Payer: PPO | Admitting: Oncology

## 2023-01-01 ENCOUNTER — Other Ambulatory Visit: Payer: PPO

## 2023-01-02 ENCOUNTER — Encounter: Payer: Self-pay | Admitting: Oncology

## 2023-01-02 ENCOUNTER — Inpatient Hospital Stay: Payer: PPO | Attending: Oncology

## 2023-01-02 ENCOUNTER — Inpatient Hospital Stay (HOSPITAL_BASED_OUTPATIENT_CLINIC_OR_DEPARTMENT_OTHER): Payer: PPO | Admitting: Oncology

## 2023-01-02 VITALS — BP 119/74 | HR 76 | Temp 97.9°F | Resp 17 | Wt 218.0 lb

## 2023-01-02 DIAGNOSIS — Z79899 Other long term (current) drug therapy: Secondary | ICD-10-CM | POA: Insufficient documentation

## 2023-01-02 DIAGNOSIS — R5383 Other fatigue: Secondary | ICD-10-CM | POA: Diagnosis not present

## 2023-01-02 DIAGNOSIS — N184 Chronic kidney disease, stage 4 (severe): Secondary | ICD-10-CM | POA: Insufficient documentation

## 2023-01-02 DIAGNOSIS — D751 Secondary polycythemia: Secondary | ICD-10-CM | POA: Diagnosis not present

## 2023-01-02 DIAGNOSIS — Z87442 Personal history of urinary calculi: Secondary | ICD-10-CM | POA: Diagnosis not present

## 2023-01-02 DIAGNOSIS — K449 Diaphragmatic hernia without obstruction or gangrene: Secondary | ICD-10-CM | POA: Insufficient documentation

## 2023-01-02 DIAGNOSIS — Z8249 Family history of ischemic heart disease and other diseases of the circulatory system: Secondary | ICD-10-CM | POA: Diagnosis not present

## 2023-01-02 DIAGNOSIS — G4730 Sleep apnea, unspecified: Secondary | ICD-10-CM | POA: Diagnosis not present

## 2023-01-02 DIAGNOSIS — Z85828 Personal history of other malignant neoplasm of skin: Secondary | ICD-10-CM | POA: Insufficient documentation

## 2023-01-02 DIAGNOSIS — Z08 Encounter for follow-up examination after completed treatment for malignant neoplasm: Secondary | ICD-10-CM

## 2023-01-02 DIAGNOSIS — Z803 Family history of malignant neoplasm of breast: Secondary | ICD-10-CM | POA: Diagnosis not present

## 2023-01-02 DIAGNOSIS — N400 Enlarged prostate without lower urinary tract symptoms: Secondary | ICD-10-CM | POA: Diagnosis not present

## 2023-01-02 DIAGNOSIS — Z825 Family history of asthma and other chronic lower respiratory diseases: Secondary | ICD-10-CM | POA: Diagnosis not present

## 2023-01-02 DIAGNOSIS — Z8572 Personal history of non-Hodgkin lymphomas: Secondary | ICD-10-CM

## 2023-01-02 DIAGNOSIS — Z87891 Personal history of nicotine dependence: Secondary | ICD-10-CM | POA: Diagnosis not present

## 2023-01-02 DIAGNOSIS — Z8349 Family history of other endocrine, nutritional and metabolic diseases: Secondary | ICD-10-CM | POA: Diagnosis not present

## 2023-01-02 DIAGNOSIS — C8590 Non-Hodgkin lymphoma, unspecified, unspecified site: Secondary | ICD-10-CM | POA: Diagnosis not present

## 2023-01-02 LAB — COMPREHENSIVE METABOLIC PANEL
ALT: 24 U/L (ref 0–44)
AST: 23 U/L (ref 15–41)
Albumin: 3.7 g/dL (ref 3.5–5.0)
Alkaline Phosphatase: 94 U/L (ref 38–126)
Anion gap: 10 (ref 5–15)
BUN: 28 mg/dL — ABNORMAL HIGH (ref 8–23)
CO2: 23 mmol/L (ref 22–32)
Calcium: 8.8 mg/dL — ABNORMAL LOW (ref 8.9–10.3)
Chloride: 103 mmol/L (ref 98–111)
Creatinine, Ser: 2.88 mg/dL — ABNORMAL HIGH (ref 0.61–1.24)
GFR, Estimated: 22 mL/min — ABNORMAL LOW (ref >60)
Glucose, Bld: 151 mg/dL — ABNORMAL HIGH (ref 70–99)
Potassium: 4.2 mmol/L (ref 3.5–5.1)
Sodium: 136 mmol/L (ref 135–145)
Total Bilirubin: 0.4 mg/dL (ref 0.3–1.2)
Total Protein: 7 g/dL (ref 6.5–8.1)

## 2023-01-02 LAB — CBC WITH DIFFERENTIAL/PLATELET
Abs Immature Granulocytes: 0.2 10*3/uL — ABNORMAL HIGH (ref 0.00–0.07)
Basophils Absolute: 0.1 10*3/uL (ref 0.0–0.1)
Basophils Relative: 1 %
Eosinophils Absolute: 0.2 10*3/uL (ref 0.0–0.5)
Eosinophils Relative: 3 %
HCT: 53.2 % — ABNORMAL HIGH (ref 39.0–52.0)
Hemoglobin: 17.6 g/dL — ABNORMAL HIGH (ref 13.0–17.0)
Immature Granulocytes: 3 %
Lymphocytes Relative: 12 %
Lymphs Abs: 0.9 10*3/uL (ref 0.7–4.0)
MCH: 31.7 pg (ref 26.0–34.0)
MCHC: 33.1 g/dL (ref 30.0–36.0)
MCV: 95.9 fL (ref 80.0–100.0)
Monocytes Absolute: 0.7 10*3/uL (ref 0.1–1.0)
Monocytes Relative: 9 %
Neutro Abs: 5.3 10*3/uL (ref 1.7–7.7)
Neutrophils Relative %: 72 %
Platelets: 163 10*3/uL (ref 150–400)
RBC: 5.55 MIL/uL (ref 4.22–5.81)
RDW: 13.4 % (ref 11.5–15.5)
WBC: 7.4 10*3/uL (ref 4.0–10.5)
nRBC: 0 % (ref 0.0–0.2)

## 2023-01-02 NOTE — Progress Notes (Signed)
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  2.4 cm hypoattenuating renal lesion consistent with a cyst he underwent EGD which showed widely patent Schatzki's ring in the distal esophagus.  Small hiatal hernia.  Any area of abnormal mucosa with multifocal areas of ulceration with heaped up edges as well as multiple pigmented spots within the ulcers found in the anterior 1/Dasovich of the stomach.  It appears that there is any mass-effect/extrinsic compression of the stomach versus primary gastric malignancy.  No active bleeding as of recent bleeding.  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 Ki67 5%. No rearrangement of MALT 1 was observed H. pylori immunostain negative.    Repeat stool antigen H. pylori testing was also negative.he received EBRT to the involved area in August 2021.  Repeat endoscopy showed no evidence of lymphoma on EGD   Patient also has history of polycythemia with a hemoglobin that fluctuates between 17-18.  JAK2, CALR, MPL and exon 12 mutation negative.  Workup  undergoing for sleep apnea  Interval history-patient reports feeling at his baseline state of health.  He recently underwent overnight sleep study for evaluation of possible sleep apnea and the results are currently pending.  ECOG PS- 1 Pain scale- 0   Review of systems- Review of Systems  Constitutional:  Positive for malaise/fatigue. Negative for chills, fever and weight loss.  HENT:  Negative for congestion, ear discharge and nosebleeds.   Eyes:  Negative for blurred vision.  Respiratory:  Negative for cough, hemoptysis, sputum production, shortness of breath and wheezing.   Cardiovascular:  Negative for chest pain, palpitations, orthopnea and claudication.  Gastrointestinal:  Negative for abdominal pain, blood in stool, constipation, diarrhea, heartburn, melena, nausea and vomiting.  Genitourinary:  Negative for dysuria, flank pain, frequency, hematuria and urgency.  Musculoskeletal:  Negative for back pain, joint pain and myalgias.  Skin:  Negative for rash.  Neurological:  Negative for dizziness, tingling, focal weakness, seizures, weakness and headaches.  Endo/Heme/Allergies:  Does not bruise/bleed easily.  Psychiatric/Behavioral:  Negative for depression and suicidal ideas. The patient does not have insomnia.       No Known Allergies   Past Medical History:  Diagnosis Date   Basal cell carcinoma 2020   back of neck   Cancer (HCC) 1991   malignant melanoma   Chronic kidney disease    Diabetes (HCC) 2023   Essential hypertension    Heart murmur    History of diverticulitis 06/2014   History of kidney stones    Hyperlipidemia    Iron deficiency anemia due to chronic blood loss 12/03/2019   MALT lymphoma (HCC) 2021   Membranous  glomerulonephritis    Myasthenia gravis (HCC)    Obesity      Past Surgical History:  Procedure Laterality Date   ESOPHAGOGASTRODUODENOSCOPY (EGD) WITH PROPOFOL N/A 04/27/2020   Procedure: ESOPHAGOGASTRODUODENOSCOPY (EGD) WITH PROPOFOL;   Surgeon: Toney Reil, MD;  Location: [facility] ENDOSCOPY;  Service: Gastroenterology;  Laterality: N/A;   ESOPHAGOGASTRODUODENOSCOPY (EGD) WITH PROPOFOL N/A 10/22/2022   Procedure: ESOPHAGOGASTRODUODENOSCOPY (EGD) WITH PROPOFOL;  Surgeon: Toney Reil, MD;  Location: [facility] ENDOSCOPY;  Service: Gastroenterology;  Laterality: N/A;   EYE SURGERY     cataract surgery done at [facility]    LITHOTRIPSY     submandibular gland removal Left     Social History   Socioeconomic History   Marital status: Married    Spouse name: Not on file   Number of children: Not on file   Years of education: Not on file   Highest education level: High school graduate  Occupational History   Not on file  Tobacco Use   Smoking status: Former    Current packs/day: 0.00    Types: Cigarettes    Quit date: 05/14/1969    Years since quitting: 53.6   Smokeless tobacco: Never  Vaping Use   Vaping status: Never Used  Substance and Sexual Activity   Alcohol use: No   Drug use: No   Sexual activity: Not Currently  Other Topics Concern   Not on file  Social History Narrative   Not on file   Social Determinants of Health   Financial Resource Strain: Low Risk  (12/18/2017)   Overall Financial Resource Strain (CARDIA)    Difficulty of Paying Living Expenses: Not hard at all  Food Insecurity: No Food Insecurity (12/03/2019)   Received from [facility]   Hunger Vital Sign    Worried About Running Out of Food in the Last Year: Never true    Ran Out of Food in the Last Year: Never true  Transportation Needs: No Transportation Needs (12/03/2019)   Received from [facility]   PRAPARE - Transportation    Lack of Transportation (Medical): No    Lack of Transportation (Non-Medical): No  Physical Activity: Insufficiently Active (12/21/2018)   Exercise Vital Sign    Days of Exercise per Week: 3 days    Minutes of Exercise per Session: 30 min  Stress: No Stress Concern Present (12/18/2017)   [employer]  of Occupational Health - Occupational Stress Questionnaire    Feeling of Stress : Not at all  Social Connections: Moderately Integrated (12/18/2017)   Social Connection and Isolation Panel [NHANES]    Frequency of Communication with Friends and Family: More than three times a week    Frequency of Social Gatherings with Friends and Family: More than three times a week    Attends Religious Services: More than 4 times per year    Active Member of [employer] or Organizations: No    Attends Banker Meetings: Never    Marital Status: Married  Catering manager Violence: Not At Risk (12/18/2017)   Humiliation, Afraid, Rape, and Kick questionnaire    Fear of Current or Ex-Partner: No    Emotionally Abused: No    Physically Abused: No    Sexually Abused: No    Family History  Problem Relation Age of Onset   Hypertension Mother    Thyroid disease Mother    Breast cancer Mother    Asthma Father      Current Outpatient Medications:    albuterol (VENTOLIN HFA)  108 (90 Base) MCG/ACT inhaler, Inhale into the lungs., Disp: , Rfl:    atorvastatin (LIPITOR) 80 MG tablet, Take 80 mg by mouth daily., Disp: , Rfl:    dutasteride (AVODART) 0.5 MG capsule, , Disp: , Rfl:    empagliflozin (JARDIANCE) 25 MG TABS tablet, Take 1 tablet by mouth daily., Disp: , Rfl:    famotidine (PEPCID) 10 MG tablet, Take 10 mg by mouth daily., Disp: , Rfl:    gabapentin (NEURONTIN) 300 MG capsule, Take by mouth., Disp: , Rfl:    levothyroxine (SYNTHROID) 175 MCG tablet, Take 175 mcg by mouth daily., Disp: , Rfl:    Nutritional Supplements (THERALITH XR PO), Take by mouth 2 (two) times daily. Takes 2 tablets in AM and 2 tablets in PM, Disp: , Rfl:    omeprazole (PRILOSEC) 40 MG capsule, Take 1 capsule (40 mg total) by mouth daily before breakfast., Disp: 30 capsule, Rfl: 0   ondansetron (ZOFRAN) 8 MG tablet, Take 1 tablet (8 mg total) by mouth every 8 (eight) hours as needed for nausea or vomiting., Disp: 30 tablet,  Rfl: 1   pyridostigmine (MESTINON) 60 MG tablet, Take 1 tablet by mouth 2 (two) times daily., Disp: , Rfl:    Semaglutide,0.25 or 0.5MG /DOS, 2 MG/3ML SOPN, Inject into the skin., Disp: , Rfl:    triamcinolone cream (KENALOG) 0.1 %, APPLY SMALL AMOUNT TOPICALLY TWO TIMES A DAY AS NEEDED FOR RED RAISED ITCHY RASH. STOP WHEN SMOOTH. AVOID FACE, ARMPIT, AND GROIN, Disp: , Rfl:    promethazine-dextromethorphan (PROMETHAZINE-DM) 6.25-15 MG/5ML syrup, Take 5 mLs by mouth every 4 (four) hours as needed. (Patient not taking: Reported on 08/24/2021), Disp: , Rfl:   Physical exam:  Vitals:   01/02/23 1018  BP: 119/74  Pulse: 76  Resp: 17  Temp: 97.9 F (36.6 C)  SpO2: 96%  Weight: 218 lb (98.9 kg)   Physical Exam Cardiovascular:     Rate and Rhythm: Normal rate and regular rhythm.     Heart sounds: Normal heart sounds.  Pulmonary:     Effort: Pulmonary effort is normal.     Breath sounds: Normal breath sounds.  Abdominal:     General: Bowel sounds are normal.     Palpations: Abdomen is soft.     Comments: No palpable hepatosplenomegaly  Lymphadenopathy:     Comments: No palpable cervical, supraclavicular, axillary or inguinal adenopathy    Skin:    General: Skin is warm and dry.  Neurological:     Mental Status: He is alert and oriented to person, place, and time.         Latest Ref Rng & Units 01/02/2023   10:00 AM  CMP  Glucose 70 - 99 mg/dL 161   BUN 8 - 23 mg/dL 28   Creatinine 0.96 - 1.24 mg/dL 0.45   Sodium 409 - 811 mmol/L 136   Potassium 3.5 - 5.1 mmol/L 4.2   Chloride 98 - 111 mmol/L 103   CO2 22 - 32 mmol/L 23   Calcium 8.9 - 10.3 mg/dL 8.8   Total Protein 6.5 - 8.1 g/dL 7.0   Total Bilirubin 0.3 - 1.2 mg/dL 0.4   Alkaline Phos 38 - 126 U/L 94   AST 15 - 41 U/L 23   ALT 0 - 44 U/L 24       Latest Ref Rng & Units 01/02/2023   10:00 AM  CBC  WBC 4.0 - 10.5 K/uL 7.4   Hemoglobin 13.0 - 17.0 g/dL 17.6  Hematocrit 39.0 - 52.0 % 53.2   Platelets 150 - 400 K/uL  163       Assessment and plan- Patient is a 77 y.o. 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  Patient had a repeat endoscopy in April 2024 as well due to symptoms of nausea which showed diffuse gastric erosions but biopsies were negative for malignancy.  Clinically he is doing well with no concerning signs and symptoms of recurrence based on today's exam.  Polycythemia: Workup for polycythemia vera negative including JAK2, CALR, MPL and exon 12 mutation.  This is likely secondary polycythemia and he is currently undergoing workup for sleep apnea.  Phlebotomy would be indicated if hematocrit is greater than 55.  I am holding off on phlebotomy today.  Repeat CBC in 3 months in 6 months and I will see him back in 6 months   Visit Diagnosis 1. Polycythemia, secondary   2. Encounter for follow-up surveillance of lymphoma      Dr. Owens Shark, MD, MPH [facility] [phone] 01/02/2023 1:08 PM

## 2023-02-20 DIAGNOSIS — E113393 Type 2 diabetes mellitus with moderate nonproliferative diabetic retinopathy without macular edema, bilateral: Secondary | ICD-10-CM | POA: Diagnosis not present

## 2023-04-04 ENCOUNTER — Inpatient Hospital Stay: Payer: PPO | Attending: Oncology

## 2023-04-04 DIAGNOSIS — Z8349 Family history of other endocrine, nutritional and metabolic diseases: Secondary | ICD-10-CM | POA: Insufficient documentation

## 2023-04-04 DIAGNOSIS — N184 Chronic kidney disease, stage 4 (severe): Secondary | ICD-10-CM | POA: Diagnosis not present

## 2023-04-04 DIAGNOSIS — Z8249 Family history of ischemic heart disease and other diseases of the circulatory system: Secondary | ICD-10-CM | POA: Diagnosis not present

## 2023-04-04 DIAGNOSIS — C8590 Non-Hodgkin lymphoma, unspecified, unspecified site: Secondary | ICD-10-CM | POA: Insufficient documentation

## 2023-04-04 DIAGNOSIS — Z08 Encounter for follow-up examination after completed treatment for malignant neoplasm: Secondary | ICD-10-CM

## 2023-04-04 DIAGNOSIS — Z79899 Other long term (current) drug therapy: Secondary | ICD-10-CM | POA: Diagnosis not present

## 2023-04-04 DIAGNOSIS — D751 Secondary polycythemia: Secondary | ICD-10-CM | POA: Diagnosis not present

## 2023-04-04 DIAGNOSIS — Z87891 Personal history of nicotine dependence: Secondary | ICD-10-CM | POA: Diagnosis not present

## 2023-04-04 DIAGNOSIS — N400 Enlarged prostate without lower urinary tract symptoms: Secondary | ICD-10-CM | POA: Diagnosis not present

## 2023-04-04 DIAGNOSIS — R5383 Other fatigue: Secondary | ICD-10-CM | POA: Insufficient documentation

## 2023-04-04 LAB — CBC
HCT: 53.2 % — ABNORMAL HIGH (ref 39.0–52.0)
Hemoglobin: 17.6 g/dL — ABNORMAL HIGH (ref 13.0–17.0)
MCH: 32.2 pg (ref 26.0–34.0)
MCHC: 33.1 g/dL (ref 30.0–36.0)
MCV: 97.4 fL (ref 80.0–100.0)
Platelets: 158 10*3/uL (ref 150–400)
RBC: 5.46 MIL/uL (ref 4.22–5.81)
RDW: 14.2 % (ref 11.5–15.5)
WBC: 6.4 10*3/uL (ref 4.0–10.5)
nRBC: 0 % (ref 0.0–0.2)

## 2023-04-28 DIAGNOSIS — N184 Chronic kidney disease, stage 4 (severe): Secondary | ICD-10-CM | POA: Diagnosis not present

## 2023-04-28 DIAGNOSIS — I129 Hypertensive chronic kidney disease with stage 1 through stage 4 chronic kidney disease, or unspecified chronic kidney disease: Secondary | ICD-10-CM | POA: Diagnosis not present

## 2023-04-28 DIAGNOSIS — E118 Type 2 diabetes mellitus with unspecified complications: Secondary | ICD-10-CM | POA: Diagnosis not present

## 2023-04-28 DIAGNOSIS — E039 Hypothyroidism, unspecified: Secondary | ICD-10-CM | POA: Diagnosis not present

## 2023-05-05 DIAGNOSIS — E039 Hypothyroidism, unspecified: Secondary | ICD-10-CM | POA: Diagnosis not present

## 2023-05-05 DIAGNOSIS — I129 Hypertensive chronic kidney disease with stage 1 through stage 4 chronic kidney disease, or unspecified chronic kidney disease: Secondary | ICD-10-CM | POA: Diagnosis not present

## 2023-05-05 DIAGNOSIS — E118 Type 2 diabetes mellitus with unspecified complications: Secondary | ICD-10-CM | POA: Diagnosis not present

## 2023-05-05 DIAGNOSIS — N184 Chronic kidney disease, stage 4 (severe): Secondary | ICD-10-CM | POA: Diagnosis not present

## 2023-05-05 DIAGNOSIS — I700 Atherosclerosis of aorta: Secondary | ICD-10-CM | POA: Diagnosis not present

## 2023-05-05 DIAGNOSIS — N052 Unspecified nephritic syndrome with diffuse membranous glomerulonephritis: Secondary | ICD-10-CM | POA: Diagnosis not present

## 2023-07-02 ENCOUNTER — Ambulatory Visit: Payer: PPO

## 2023-07-02 ENCOUNTER — Ambulatory Visit
Admission: EM | Admit: 2023-07-02 | Discharge: 2023-07-02 | Disposition: A | Discharge status: Home / Self Care | Payer: PPO | Attending: Family Medicine | Admitting: Family Medicine

## 2023-07-02 ENCOUNTER — Ambulatory Visit (INDEPENDENT_AMBULATORY_CARE_PROVIDER_SITE_OTHER): Payer: PPO

## 2023-07-02 DIAGNOSIS — R053 Chronic cough: Secondary | ICD-10-CM

## 2023-07-02 DIAGNOSIS — R0789 Other chest pain: Secondary | ICD-10-CM | POA: Diagnosis not present

## 2023-07-02 DIAGNOSIS — N644 Mastodynia: Secondary | ICD-10-CM | POA: Diagnosis not present

## 2023-07-02 MED ORDER — METHOCARBAMOL 500 MG PO TABS: 500.0000 mg | ORAL_TABLET | Freq: Two times a day (BID) | ORAL | 0 refills | Status: AC

## 2023-07-02 MED ORDER — PREDNISONE 10 MG (21) PO TBPK: ORAL_TABLET | Freq: Every day | ORAL | 0 refills | Status: AC

## 2023-07-02 MED ORDER — OXYCODONE HCL 5 MG PO TABS: 5.0000 mg | ORAL_TABLET | Freq: Three times a day (TID) | ORAL | 0 refills | Status: AC | PRN

## 2023-07-02 NOTE — ED Provider Notes (Signed)
 [facility]    CSN: [id number] Arrival date & time: 07/02/23  0803      History   Chief Complaint Chief Complaint  Patient presents with   Breast Pain    HPI  HPI Bill Aguilar is a 78 y.o. male.   [name] presents for right breast pain that started on Saturday.  Pain is getting worse.  Pain hurts worse with coughing and sneezing but now pain is constant.  Pain 2 at rest but 10 with coughing. Driving down the road makes the pain worse but can tell the pain is there when he sitting still.  I know its there.  Went to the [place] ED this weekend but left after the long wait.  He has been applying topical pain cream with Tylenol without relief.    He and his wife sold their old dinning room set and he helped the people who brought it move it out of his house.  The pain started after that.  Doesn't think he moved wrong.    Denies new shortness of breath, new cough and uses a breathing machine for the wheezing at night.  This has been going on for months now and he and his PCP are working together to figure this out.   No fever, nausea, vomiting, neck pain, back pain, numbness or tingling in extremities.     Past Medical History:  Diagnosis Date   Basal cell carcinoma 2020   back of neck   Cancer (HCC) 1991   malignant melanoma   Chronic kidney disease    Diabetes (HCC) 2023   Essential hypertension    Heart murmur    History of diverticulitis 06/2014   History of kidney stones    Hyperlipidemia    Iron deficiency anemia due to chronic blood loss 12/03/2019   MALT lymphoma 2021   Membranous glomerulonephritis    Myasthenia gravis (HCC)    Obesity     Patient Active Problem List   Diagnosis Date Noted   Chronic nausea 10/22/2022   Gastric erosion 10/22/2022   Personal history of malignant lymphoma 10/22/2022   Polycythemia, secondary 09/25/2022   Nephrotic syndrome with diffuse membranous glomerulonephritis 06/28/2021   Other specified dermatitis  06/28/2021   Diabetes mellitus type 2 with complications (HCC) 02/05/2021   Renal failure 08/01/2020   Sprain of back 08/01/2020   Anemia in chronic kidney disease 05/03/2020   History of lymphoma    Goals of care, counseling/discussion 12/19/2019   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 12/19/2019   Gastric mass 12/03/2019   Aortic atherosclerosis (HCC) 08/02/2019   Nephrolithiasis 08/02/2019   Benign hypertensive kidney disease with chronic kidney disease 04/19/2019   Gastroesophageal reflux disease 04/19/2019   Secondary hyperparathyroidism of renal origin (HCC) 04/19/2019   Advanced care planning/counseling discussion 06/09/2017   Class 1 drug-induced obesity with body mass index (BMI) of 32.0 to 32.9 in adult 06/09/2017   BPH (benign prostatic hyperplasia) 06/06/2016   Membranous glomerulonephritis 05/15/2015   Hypothyroidism 05/15/2015   Myasthenia gravis (HCC) 05/15/2015   Stage 4 chronic kidney disease (HCC) 05/15/2015   Membranous glomerulonephritis, stage 4 05/15/2015   Unspecified nephritic syndrome with diffuse membranous glomerulonephritis 05/15/2015   Hyperlipidemia    Essential hypertension     Past Surgical History:  Procedure Laterality Date   ESOPHAGOGASTRODUODENOSCOPY (EGD) WITH PROPOFOL  N/A 04/27/2020   Procedure: ESOPHAGOGASTRODUODENOSCOPY (EGD) WITH PROPOFOL ;  Surgeon: Unk Corinn Skiff, MD;  Location: [facility];  Service: Gastroenterology;  Laterality: N/A;   ESOPHAGOGASTRODUODENOSCOPY (EGD) WITH PROPOFOL  N/A 10/22/2022   Procedure: ESOPHAGOGASTRODUODENOSCOPY (EGD) WITH PROPOFOL ;  Surgeon: Unk Corinn Skiff, MD;  Location: [facility];  Service: Gastroenterology;  Laterality: N/A;   EYE SURGERY     cataract surgery done at [facility]    LITHOTRIPSY     submandibular gland removal Left        Home Medications    Prior to Admission medications   Medication Sig Start Date End Date Taking? Authorizing  Provider  albuterol (VENTOLIN HFA) 108 (90 Base) MCG/ACT inhaler Inhale into the lungs. 11/16/20 07/02/23 Yes [provider]  atorvastatin  (LIPITOR) 80 MG tablet Take 80 mg by mouth daily.   Yes [provider]  dutasteride (AVODART) 0.5 MG capsule  09/11/20  Yes [provider]  empagliflozin (JARDIANCE) 25 MG TABS tablet Take 1 tablet by mouth daily. 05/22/21  Yes [provider]  famotidine (PEPCID) 10 MG tablet Take 10 mg by mouth daily.   Yes [provider]  gabapentin (NEURONTIN) 300 MG capsule Take by mouth.   Yes [provider]  levothyroxine  (SYNTHROID ) 175 MCG tablet Take 175 mcg by mouth daily. 07/17/21  Yes [provider]  losartan (COZAAR) 100 MG tablet Take by mouth. 06/01/23  Yes [provider]  methocarbamol  (ROBAXIN ) 500 MG tablet Take 1 tablet (500 mg total) by mouth 2 (two) times daily. 07/02/23  Yes Harshaan Whang, DO  Nutritional Supplements (THERALITH XR PO) Take by mouth 2 (two) times daily. Takes 2 tablets in AM and 2 tablets in PM   Yes [provider]  omeprazole  (PRILOSEC ) 40 MG capsule Take 1 capsule (40 mg total) by mouth daily before breakfast. 10/29/22  Yes Vanga, Rohini Reddy, MD  ondansetron  (ZOFRAN ) 8 MG tablet Take 1 tablet (8 mg total) by mouth every 8 (eight) hours as needed for nausea or vomiting. 07/23/21  Yes Vanga, Corinn Skiff, MD  oxyCODONE  (ROXICODONE ) 5 MG immediate release tablet Take 1 tablet (5 mg total) by mouth every 8 (eight) hours as needed for severe pain (pain score 7-10) or moderate pain (pain score 4-6). 07/02/23  Yes Arsal Tappan, DO  predniSONE  (STERAPRED UNI-PAK 21 TAB) 10 MG (21) TBPK tablet Take by mouth daily. Take 6 tabs by mouth daily for 1, then 5 tabs for 1 day, then 4 tabs for 1 day, then 3 tabs for 1 day, then 2 tabs for 1 day, then 1 tab for 1 day. 07/02/23  Yes Armiyah Capron, DO  pyridostigmine (MESTINON) 60 MG tablet Take 1 tablet by mouth 2 (two) times  daily. 09/11/20  Yes [provider]  Semaglutide,0.25 or 0.5MG /DOS, 2 MG/3ML SOPN Inject into the skin. 12/06/22  Yes [provider]  triamcinolone cream (KENALOG) 0.1 % APPLY SMALL AMOUNT TOPICALLY TWO TIMES A DAY AS NEEDED FOR RED RAISED ITCHY RASH. STOP WHEN SMOOTH. AVOID FACE, ARMPIT, AND GROIN 05/03/21  Yes [provider]    Family History Family History  Problem Relation Age of Onset   Hypertension Mother    Thyroid  disease Mother    Breast cancer Mother    Asthma Father     Social History Social History   Tobacco Use   Smoking status: Former    Current packs/day: 0.00    Types: Cigarettes    Quit date: 05/14/1969    Years since quitting: 54.1   Smokeless tobacco: Never  Vaping Use   Vaping status: Never Used  Substance Use Topics   Alcohol use: No  Drug use: No     Allergies   Patient has no known allergies.   Review of Systems Review of Systems: :negative unless otherwise stated in HPI.      Physical Exam Triage Vital Signs ED Triage Vitals  Encounter Vitals Group     BP 07/02/23 0823 (!) 92/58     Systolic BP Percentile --      Diastolic BP Percentile --      Pulse Rate 07/02/23 0823 80     Resp 07/02/23 0823 16     Temp 07/02/23 0823 97.6 F (36.4 C)     Temp Source 07/02/23 0823 Oral     SpO2 07/02/23 0823 96 %     Weight 07/02/23 0822 207 lb (93.9 kg)     Height 07/02/23 0822 5' 7 (1.702 m)     Head Circumference --      Peak Flow --      Pain Score 07/02/23 0826 2     Pain Loc --      Pain Education --      Exclude from Growth Chart --    No data found.  Updated Vital Signs BP (!) 92/58 (BP Location: Left Arm)   Pulse 80   Temp 97.6 F (36.4 C) (Oral)   Resp 16   Ht 5' 7 (1.702 m)   Wt 93.9 kg   SpO2 96%   BMI 32.42 kg/m   Visual Acuity Right Eye Distance:   Left Eye Distance:   Bilateral Distance:    Right Eye Near:   Left Eye Near:    Bilateral Near:     Physical Exam GEN: pleasant  well appearing elderly male CV: regular rate and rhythm, no rubs or gallops, no JVP Chest Wall: No erythema, ecchymosis or step-offs.  Cherry hemangiomas present, tenderness to palpation right mid to lateral rib 5-6 RESP: no increased work of breathing, faint expiratory wheezing bilaterally SKIN: warm, dry, no rash on visible skin NEURO: alert, moves all extremities appropriately    UC Treatments / Results  Labs (all labs ordered are listed, but only abnormal results are displayed) Labs Reviewed - No data to display  EKG   Radiology DG Ribs Unilateral Right Result Date: 07/02/2023 CLINICAL DATA:  78 year old male with pain under the right breast for 5 days after heavy lifting. EXAM: RIGHT RIBS - 2 VIEW COMPARISON:  Chest radiographs today.  Chest CT 11/01/2013. FINDINGS: Four views of the right ribs. Right anterior 6 rib level marker in place. Bone mineralization is within normal limits for age. No right rib fracture or rib lesion is identified. Other visible osseous structures appear intact. Negative right upper quadrant bowel gas, right lung base. IMPRESSION: No right rib fracture or acute osseous abnormality identified. Electronically Signed   By: VEAR Hurst M.D.   On: 07/02/2023 09:25   DG Chest 2 View Result Date: 07/02/2023 CLINICAL DATA:  78 year old male with pain under the right breast for 5 days after heavy lifting. EXAM: CHEST - 2 VIEW COMPARISON:  Chest CT 11/01/2013 and earlier. FINDINGS: PA and lateral views 0856 hours. Lung volumes and mediastinal contours are stable and normal. Visualized tracheal air column is within normal limits. No pneumothorax, pulmonary edema, pleural effusion or confluent lung opacity. No acute osseous abnormality identified. Maintained thoracic vertebral body height. Negative visible bowel gas. IMPRESSION: No acute cardiopulmonary abnormality. Electronically Signed   By: VEAR Hurst M.D.   On: 07/02/2023 09:22     Procedures Procedures (including critical  care time)  Medications Ordered in UC Medications - No data to display  Initial Impression / Assessment and Plan / UC Course  I have reviewed the triage vital signs and the nursing notes.  Pertinent labs & imaging results that were available during my care of the patient were reviewed by me and considered in my medical decision making (see chart for details).      Pt is a 78 y.o.  male with anterior right chest pain that started after moving a dinning room table over the weekend.  Vital signs stable.  Satting well on room air.  He is afebrile.  On exam, pt has tenderness at surrounding rib 5/6.  He had some physical discomfort while sitting in the exam room therefore EKG obtained, personally interpreted by me and showed sinus rhythm with incomplete right bundle branch block.  EKG on 08/16/2009 review for comparison and the incomplete RBBB is new.   I cannot rule out a rib fracture without a rib series.  Rib series obtained with chest x-ray as he does have some faint expiratory wheezing bilaterally and a chronic cough. Chest xray personally reviewed by me without focal pneumonia, pleural effusion, cardiomegaly or pneumothorax.  Unilateral right rib series did not show any rib fractures.  Radiologist impression reviewed.  I highly suspect a musculoskeletal cause giving relationship to moving, coughing and sneezing therefore cardiac workup truncated and her labs were obtained.   Patient to gradually return to normal activities, as tolerated and continue ordinary activities within the limits permitted by pain. Prescribed oxycodone  5 mg, prednisone  taper and muscle relaxer  for pain relief.  Tylenol 3 times daily PRN. Advised patient to avoid OTC NSAIDs due to history of CKD stage IV. Counseled patient on red flag symptoms and when to seek immediate care.    Patient to follow up with primary care provider as he may need to see a cardiologist if his pain does not improve. Strict ED precautions given  and understanding voiced. Understanding voiced. Discussed MDM, treatment plan and plan for follow-up with patient who agrees with plan.   Final Clinical Impressions(s) / UC Diagnoses   Final diagnoses:  Atypical chest pain  Chronic cough     Discharge Instructions      Your chest x-ray did not show any rib fractures, fluid in your lungs or pneumonia. Stop by the pharmacy to pick up your prescriptions.  Monitor your blood sugar while taking prednisone .  Call your doctor, if it goes above 375.  This will be a short-term course of steroids so we will return back to your normal values in the next 1 to 2 weeks.  The muscle relaxer methocarbamol /Robaxin  can make you sleepy.  You may want to try using this medication only at bedtime, if it makes you too drowsy during the day.  For pain I prescribed oxycodone .  Do not drive or operate heavy machinery with taking this medication.  Follow-up with your primary care doctor in the next 2 weeks.       ED Prescriptions     Medication Sig Dispense Auth. Provider   oxyCODONE  (ROXICODONE ) 5 MG immediate release tablet Take 1 tablet (5 mg total) by mouth every 8 (eight) hours as needed for severe pain (pain score 7-10) or moderate pain (pain score 4-6). 12 tablet Anddy Wingert, DO   predniSONE  (STERAPRED UNI-PAK 21 TAB) 10 MG (21) TBPK tablet Take by mouth daily. Take 6 tabs by mouth daily for 1, then 5 tabs for 1  day, then 4 tabs for 1 day, then 3 tabs for 1 day, then 2 tabs for 1 day, then 1 tab for 1 day. 21 tablet Millissa Deese, DO   methocarbamol  (ROBAXIN ) 500 MG tablet Take 1 tablet (500 mg total) by mouth 2 (two) times daily. 20 tablet Liberta Gimpel, DO      I have reviewed the PDMP during this encounter.   Maren Wiesen, DO 07/02/23 1122

## 2023-07-02 NOTE — Discharge Instructions (Addendum)
 Your chest x-ray did not show any rib fractures, fluid in your lungs or pneumonia. Stop by the pharmacy to pick up your prescriptions.  Monitor your blood sugar while taking prednisone .  Call your doctor, if it goes above 375.  This will be a short-term course of steroids so we will return back to your normal values in the next 1 to 2 weeks.  The muscle relaxer methocarbamol /Robaxin  can make you sleepy.  You may want to try using this medication only at bedtime, if it makes you too drowsy during the day.  For pain I prescribed oxycodone .  Do not drive or operate heavy machinery with taking this medication.  Follow-up with your primary care doctor in the next 2 weeks.

## 2023-07-02 NOTE — ED Triage Notes (Signed)
 Pt c/o pain under R breast x5 days. States was doing some heavy lifting on Saturday & pain started after.

## 2023-07-04 ENCOUNTER — Inpatient Hospital Stay: Payer: PPO | Attending: Oncology

## 2023-07-04 ENCOUNTER — Inpatient Hospital Stay (HOSPITAL_BASED_OUTPATIENT_CLINIC_OR_DEPARTMENT_OTHER): Payer: PPO | Admitting: Oncology

## 2023-07-04 ENCOUNTER — Encounter: Payer: Self-pay | Admitting: Oncology

## 2023-07-04 VITALS — BP 112/59 | HR 77 | Temp 98.6°F | Resp 18 | Wt 218.0 lb

## 2023-07-04 DIAGNOSIS — Z8572 Personal history of non-Hodgkin lymphomas: Secondary | ICD-10-CM

## 2023-07-04 DIAGNOSIS — Z8249 Family history of ischemic heart disease and other diseases of the circulatory system: Secondary | ICD-10-CM | POA: Diagnosis not present

## 2023-07-04 DIAGNOSIS — K449 Diaphragmatic hernia without obstruction or gangrene: Secondary | ICD-10-CM | POA: Diagnosis not present

## 2023-07-04 DIAGNOSIS — Z87442 Personal history of urinary calculi: Secondary | ICD-10-CM | POA: Insufficient documentation

## 2023-07-04 DIAGNOSIS — N400 Enlarged prostate without lower urinary tract symptoms: Secondary | ICD-10-CM | POA: Diagnosis not present

## 2023-07-04 DIAGNOSIS — Z85828 Personal history of other malignant neoplasm of skin: Secondary | ICD-10-CM | POA: Insufficient documentation

## 2023-07-04 DIAGNOSIS — D751 Secondary polycythemia: Secondary | ICD-10-CM

## 2023-07-04 DIAGNOSIS — R5383 Other fatigue: Secondary | ICD-10-CM | POA: Insufficient documentation

## 2023-07-04 DIAGNOSIS — N184 Chronic kidney disease, stage 4 (severe): Secondary | ICD-10-CM | POA: Insufficient documentation

## 2023-07-04 DIAGNOSIS — I129 Hypertensive chronic kidney disease with stage 1 through stage 4 chronic kidney disease, or unspecified chronic kidney disease: Secondary | ICD-10-CM | POA: Insufficient documentation

## 2023-07-04 DIAGNOSIS — Z79899 Other long term (current) drug therapy: Secondary | ICD-10-CM | POA: Diagnosis not present

## 2023-07-04 DIAGNOSIS — Z803 Family history of malignant neoplasm of breast: Secondary | ICD-10-CM | POA: Insufficient documentation

## 2023-07-04 DIAGNOSIS — Z08 Encounter for follow-up examination after completed treatment for malignant neoplasm: Secondary | ICD-10-CM

## 2023-07-04 DIAGNOSIS — E1122 Type 2 diabetes mellitus with diabetic chronic kidney disease: Secondary | ICD-10-CM | POA: Insufficient documentation

## 2023-07-04 DIAGNOSIS — G4730 Sleep apnea, unspecified: Secondary | ICD-10-CM | POA: Insufficient documentation

## 2023-07-04 DIAGNOSIS — Z87891 Personal history of nicotine dependence: Secondary | ICD-10-CM | POA: Insufficient documentation

## 2023-07-04 DIAGNOSIS — N644 Mastodynia: Secondary | ICD-10-CM | POA: Insufficient documentation

## 2023-07-04 DIAGNOSIS — Z8349 Family history of other endocrine, nutritional and metabolic diseases: Secondary | ICD-10-CM | POA: Diagnosis not present

## 2023-07-04 DIAGNOSIS — Z825 Family history of asthma and other chronic lower respiratory diseases: Secondary | ICD-10-CM | POA: Insufficient documentation

## 2023-07-04 LAB — CBC
HCT: 53 % — ABNORMAL HIGH (ref 39.0–52.0)
Hemoglobin: 17.7 g/dL — ABNORMAL HIGH (ref 13.0–17.0)
MCH: 32.2 pg (ref 26.0–34.0)
MCHC: 33.4 g/dL (ref 30.0–36.0)
MCV: 96.4 fL (ref 80.0–100.0)
Platelets: 166 10*3/uL (ref 150–400)
RBC: 5.5 MIL/uL (ref 4.22–5.81)
RDW: 13.4 % (ref 11.5–15.5)
WBC: 11.3 10*3/uL — ABNORMAL HIGH (ref 4.0–10.5)
nRBC: 0 % (ref 0.0–0.2)

## 2023-07-05 NOTE — Progress Notes (Signed)
 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  2.4 cm hypoattenuating renal lesion consistent with a cyst he underwent EGD which showed widely patent Schatzki's ring in the distal esophagus.  Small hiatal hernia.  Any area of abnormal mucosa with multifocal areas of ulceration with heaped up edges as well as multiple pigmented spots within the ulcers found in the anterior 1/Dasovich of the stomach.  It appears that there is any mass-effect/extrinsic compression of the stomach versus primary gastric malignancy.  No active bleeding as of recent bleeding.  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 Ki67 5%. No rearrangement of MALT 1 was observed H. pylori immunostain negative.    Repeat stool antigen H. pylori testing was also negative.he received EBRT to the involved area in August 2021.  Repeat endoscopy showed no evidence of lymphoma on EGD    Patient also has history of polycythemia with a hemoglobin that fluctuates between  17-18.  JAK2, CALR, MPL and exon 12 mutation negative.  Workup undergoing for sleep apnea.  He will be getting his CPAP machine in April 2025  Interval history-overall he is doing well.  Has baseline fatigue.  He will be getting his sleep apnea machine in April.  ECOG PS- 1 Pain scale- 0   Review of systems- Review of Systems  Constitutional:  Positive for malaise/fatigue. Negative for chills, fever and weight loss.  HENT:  Negative for congestion, ear discharge and nosebleeds.   Eyes:  Negative for blurred vision.  Respiratory:  Negative for cough, hemoptysis, sputum production, shortness of breath and wheezing.   Cardiovascular:  Negative for chest pain, palpitations, orthopnea and claudication.  Gastrointestinal:  Negative for abdominal pain, blood in stool, constipation, diarrhea, heartburn, melena, nausea and vomiting.  Genitourinary:  Negative for dysuria, flank pain, frequency, hematuria and urgency.  Musculoskeletal:  Negative for back pain, joint pain and myalgias.  Skin:  Negative for rash.  Neurological:  Negative for dizziness, tingling, focal weakness, seizures, weakness and headaches.  Endo/Heme/Allergies:  Does not bruise/bleed easily.  Psychiatric/Behavioral:  Negative for depression and suicidal ideas. The patient does not have insomnia.       No Known Allergies   Past Medical History:  Diagnosis Date   Basal cell carcinoma 2020   back of neck   Cancer (HCC) 1991   malignant melanoma   Chronic kidney disease    Diabetes (HCC) 2023   Essential hypertension    Heart murmur    History of diverticulitis 06/2014   History of kidney stones    Hyperlipidemia    Iron deficiency anemia due to  chronic blood loss 12/03/2019   MALT lymphoma 2021   Membranous glomerulonephritis    Myasthenia gravis (HCC)    Obesity      Past Surgical History:  Procedure Laterality Date   ESOPHAGOGASTRODUODENOSCOPY (EGD) WITH PROPOFOL  N/A 04/27/2020   Procedure:  ESOPHAGOGASTRODUODENOSCOPY (EGD) WITH PROPOFOL ;  Surgeon: Unk Corinn Skiff, MD;  Location: [facility] ENDOSCOPY;  Service: Gastroenterology;  Laterality: N/A;   ESOPHAGOGASTRODUODENOSCOPY (EGD) WITH PROPOFOL  N/A 10/22/2022   Procedure: ESOPHAGOGASTRODUODENOSCOPY (EGD) WITH PROPOFOL ;  Surgeon: Unk Corinn Skiff, MD;  Location: [facility] ENDOSCOPY;  Service: Gastroenterology;  Laterality: N/A;   EYE SURGERY     cataract surgery done at [facility]    LITHOTRIPSY     submandibular gland removal Left     Social History   Socioeconomic History   Marital status: Married    Spouse name: Not on file   Number of children: Not on file   Years of education: Not on file   Highest education level: High school graduate  Occupational History   Not on file  Tobacco Use   Smoking status: Former    Current packs/day: 0.00    Types: Cigarettes    Quit date: 05/14/1969    Years since quitting: 54.1   Smokeless tobacco: Never  Vaping Use   Vaping status: Never Used  Substance and Sexual Activity   Alcohol use: No   Drug use: No   Sexual activity: Not Currently  Other Topics Concern   Not on file  Social History Narrative   Not on file   Social Drivers of Health   Financial Resource Strain: Low Risk  (05/05/2023)   Received from [facility]   Overall Financial Resource Strain (CARDIA)    Difficulty of Paying Living Expenses: Not hard at all  Food Insecurity: No Food Insecurity (05/05/2023)   Received [facility]   Hunger Vital Sign    Worried About Running Out of Food in the Last Year: Never true    Ran Out of Food in the Last Year: Never true  Transportation Needs: No Transportation Needs (05/05/2023)   Received from [facility] - Transportation    In the past 12 months, has lack of transportation kept you from medical appointments or from getting medications?: No    Lack of Transportation (Non-Medical): No  Physical Activity:  Insufficiently Active (12/21/2018)   Exercise Vital Sign    Days of Exercise per Week: 3 days    Minutes of Exercise per Session: 30 min  Stress: No Stress Concern Present (12/18/2017)   [employer] of Occupational Health - Occupational Stress Questionnaire    Feeling of Stress : Not at all  Social Connections: Moderately Integrated (12/18/2017)   Social Connection and Isolation Panel [NHANES]    Frequency of Communication with Friends and Family: More than three times a week    Frequency of Social Gatherings with Friends and Family: More than three times a week    Attends Religious Services: More than 4 times per year    Active Member of [employer] or Organizations: No    Attends Banker Meetings: Never    Marital Status: Married  Catering Manager Violence: Not At Risk (12/18/2017)   Humiliation, Afraid, Rape, and Kick questionnaire    Fear of Current or Ex-Partner: No    Emotionally Abused: No    Physically Abused: No    Sexually Abused: No    Family History  Problem Relation Age of  Onset   Hypertension Mother    Thyroid  disease Mother    Breast cancer Mother    Asthma Father      Current Outpatient Medications:    finasteride (PROSCAR) 5 MG tablet, Take by mouth., Disp: , Rfl:    ipratropium-albuterol (DUONEB) 0.5-2.5 (3) MG/3ML SOLN, Inhale into the lungs., Disp: , Rfl:    scopolamine  (TRANSDERM-SCOP) 1 MG/3DAYS, Place onto the skin., Disp: , Rfl:    tamsulosin (FLOMAX) 0.4 MG CAPS capsule, Take by mouth., Disp: , Rfl:    albuterol (VENTOLIN HFA) 108 (90 Base) MCG/ACT inhaler, Inhale into the lungs., Disp: , Rfl:    atorvastatin  (LIPITOR) 80 MG tablet, Take 80 mg by mouth daily., Disp: , Rfl:    dutasteride (AVODART) 0.5 MG capsule, , Disp: , Rfl:    empagliflozin (JARDIANCE) 25 MG TABS tablet, Take 1 tablet by mouth daily., Disp: , Rfl:    famotidine (PEPCID) 10 MG tablet, Take 10 mg by mouth daily., Disp: , Rfl:    gabapentin (NEURONTIN) 300 MG capsule, Take  by mouth., Disp: , Rfl:    levothyroxine  (SYNTHROID ) 175 MCG tablet, Take 175 mcg by mouth daily., Disp: , Rfl:    losartan (COZAAR) 100 MG tablet, Take by mouth., Disp: , Rfl:    methocarbamol  (ROBAXIN ) 500 MG tablet, Take 1 tablet (500 mg total) by mouth 2 (two) times daily., Disp: 20 tablet, Rfl: 0   Nutritional Supplements (THERALITH XR PO), Take by mouth 2 (two) times daily. Takes 2 tablets in AM and 2 tablets in PM, Disp: , Rfl:    omeprazole  (PRILOSEC ) 40 MG capsule, Take 1 capsule (40 mg total) by mouth daily before breakfast., Disp: 30 capsule, Rfl: 0   ondansetron  (ZOFRAN ) 8 MG tablet, Take 1 tablet (8 mg total) by mouth every 8 (eight) hours as needed for nausea or vomiting., Disp: 30 tablet, Rfl: 1   oxyCODONE  (ROXICODONE ) 5 MG immediate release tablet, Take 1 tablet (5 mg total) by mouth every 8 (eight) hours as needed for severe pain (pain score 7-10) or moderate pain (pain score 4-6)., Disp: 12 tablet, Rfl: 0   predniSONE  (STERAPRED UNI-PAK 21 TAB) 10 MG (21) TBPK tablet, Take by mouth daily. Take 6 tabs by mouth daily for 1, then 5 tabs for 1 day, then 4 tabs for 1 day, then 3 tabs for 1 day, then 2 tabs for 1 day, then 1 tab for 1 day., Disp: 21 tablet, Rfl: 0   pyridostigmine (MESTINON) 60 MG tablet, Take 1 tablet by mouth 2 (two) times daily., Disp: , Rfl:    Semaglutide,0.25 or 0.5MG /DOS, 2 MG/3ML SOPN, Inject into the skin., Disp: , Rfl:    triamcinolone cream (KENALOG) 0.1 %, APPLY SMALL AMOUNT TOPICALLY TWO TIMES A DAY AS NEEDED FOR RED RAISED ITCHY RASH. STOP WHEN SMOOTH. AVOID FACE, ARMPIT, AND GROIN, Disp: , Rfl:   Physical exam:  Vitals:   07/04/23 1058  BP: (!) 112/59  Pulse: 77  Resp: 18  Temp: 98.6 F (37 C)  TempSrc: Tympanic  SpO2: 97%  Weight: 218 lb (98.9 kg)   Physical Exam Cardiovascular:     Rate and Rhythm: Normal rate and regular rhythm.     Heart sounds: Normal heart sounds.  Pulmonary:     Effort: Pulmonary effort is normal.     Breath sounds:  Normal breath sounds.  Abdominal:     General: Bowel sounds are normal.     Palpations: Abdomen is soft.  Lymphadenopathy:  Comments: No palpable cervical, supraclavicular, axillary or inguinal adenopathy    Skin:    General: Skin is warm and dry.  Neurological:     Mental Status: He is alert and oriented to person, place, and time.         Latest Ref Rng & Units 01/02/2023   10:00 AM  CMP  Glucose 70 - 99 mg/dL 848   BUN 8 - 23 mg/dL 28   Creatinine 9.38 - 1.24 mg/dL 7.11   Sodium 864 - 854 mmol/L 136   Potassium 3.5 - 5.1 mmol/L 4.2   Chloride 98 - 111 mmol/L 103   CO2 22 - 32 mmol/L 23   Calcium  8.9 - 10.3 mg/dL 8.8   Total Protein 6.5 - 8.1 g/dL 7.0   Total Bilirubin 0.3 - 1.2 mg/dL 0.4   Alkaline Phos 38 - 126 U/L 94   AST 15 - 41 U/L 23   ALT 0 - 44 U/L 24       Latest Ref Rng & Units 07/04/2023    9:56 AM  CBC  WBC 4.0 - 10.5 K/uL 11.3   Hemoglobin 13.0 - 17.0 g/dL 82.2   Hematocrit 60.9 - 52.0 % 53.0   Platelets 150 - 400 K/uL 166     No images are attached to the encounter.  DG Ribs Unilateral Right Result Date: 07/02/2023 CLINICAL DATA:  78 year old male with pain under the right breast for 5 days after heavy lifting. EXAM: RIGHT RIBS - 2 VIEW COMPARISON:  Chest radiographs today.  Chest CT 11/01/2013. FINDINGS: Four views of the right ribs. Right anterior 6 rib level marker in place. Bone mineralization is within normal limits for age. No right rib fracture or rib lesion is identified. Other visible osseous structures appear intact. Negative right upper quadrant bowel gas, right lung base. IMPRESSION: No right rib fracture or acute osseous abnormality identified. Electronically Signed   By: VEAR Hurst M.D.   On: 07/02/2023 09:25   DG Chest 2 View Result Date: 07/02/2023 CLINICAL DATA:  78 year old male with pain under the right breast for 5 days after heavy lifting. EXAM: CHEST - 2 VIEW COMPARISON:  Chest CT 11/01/2013 and earlier. FINDINGS: PA and lateral  views 0856 hours. Lung volumes and mediastinal contours are stable and normal. Visualized tracheal air column is within normal limits. No pneumothorax, pulmonary edema, pleural effusion or confluent lung opacity. No acute osseous abnormality identified. Maintained thoracic vertebral body height. Negative visible bowel gas. IMPRESSION: No acute cardiopulmonary abnormality. Electronically Signed   By: VEAR Hurst M.D.   On: 07/02/2023 09:22     Assessment and plan- Patient is a 78 y.o. male who is here for follow-up of following issues  Secondary polycythemia: Hemoglobin has remained stable around 17 for the last 3 years.  Hematocrit remains less than 55 and he does not require phlebotomy at this time.  Workup for polycythemia vera has been negative in the past.  Etiology of secondary polycythemia likely due to sleep apnea.  Patient will be receiving his sleep apnea machine in 2 months and hopefully his hemoglobin could improve after that.  I will repeat labs in 6 months and see him thereafter.  History of MALT lymphoma s/p EBRT in August 2021.  Clinically patient is doing well with no signs and symptoms of recurrence based on today's exam.  I will continue to follow him every 6 months for up to 5 years from his diagnosis   Visit Diagnosis 1. Polycythemia, secondary  2. Encounter for follow-up surveillance of lymphoma      Dr. Annah Skene, MD, MPH [facility] [phone] 07/05/2023 10:49 AM

## 2023-08-16 DIAGNOSIS — R531 Weakness: Secondary | ICD-10-CM | POA: Diagnosis not present

## 2023-08-16 DIAGNOSIS — E1142 Type 2 diabetes mellitus with diabetic polyneuropathy: Secondary | ICD-10-CM | POA: Diagnosis not present

## 2023-08-16 DIAGNOSIS — D751 Secondary polycythemia: Secondary | ICD-10-CM | POA: Diagnosis not present

## 2023-08-16 DIAGNOSIS — Z043 Encounter for examination and observation following other accident: Secondary | ICD-10-CM | POA: Diagnosis not present

## 2023-08-16 DIAGNOSIS — Z8572 Personal history of non-Hodgkin lymphomas: Secondary | ICD-10-CM | POA: Diagnosis not present

## 2023-08-16 DIAGNOSIS — I129 Hypertensive chronic kidney disease with stage 1 through stage 4 chronic kidney disease, or unspecified chronic kidney disease: Secondary | ICD-10-CM | POA: Diagnosis not present

## 2023-08-16 DIAGNOSIS — G5790 Unspecified mononeuropathy of unspecified lower limb: Secondary | ICD-10-CM | POA: Diagnosis not present

## 2023-08-16 DIAGNOSIS — T443X5A Adverse effect of other parasympatholytics [anticholinergics and antimuscarinics] and spasmolytics, initial encounter: Secondary | ICD-10-CM | POA: Diagnosis not present

## 2023-08-16 DIAGNOSIS — S199XXA Unspecified injury of neck, initial encounter: Secondary | ICD-10-CM | POA: Diagnosis not present

## 2023-08-16 DIAGNOSIS — K219 Gastro-esophageal reflux disease without esophagitis: Secondary | ICD-10-CM | POA: Diagnosis not present

## 2023-08-16 DIAGNOSIS — D696 Thrombocytopenia, unspecified: Secondary | ICD-10-CM | POA: Diagnosis not present

## 2023-08-16 DIAGNOSIS — C884 Extranodal marginal zone b-cell lymphoma of mucosa-associated lymphoid tissue (malt-lymphoma) not having achieved remission: Secondary | ICD-10-CM | POA: Diagnosis not present

## 2023-08-16 DIAGNOSIS — N184 Chronic kidney disease, stage 4 (severe): Secondary | ICD-10-CM | POA: Diagnosis not present

## 2023-08-16 DIAGNOSIS — D571 Sickle-cell disease without crisis: Secondary | ICD-10-CM | POA: Diagnosis not present

## 2023-08-16 DIAGNOSIS — L209 Atopic dermatitis, unspecified: Secondary | ICD-10-CM | POA: Diagnosis not present

## 2023-08-16 DIAGNOSIS — Z85828 Personal history of other malignant neoplasm of skin: Secondary | ICD-10-CM | POA: Diagnosis not present

## 2023-08-16 DIAGNOSIS — Z87891 Personal history of nicotine dependence: Secondary | ICD-10-CM | POA: Diagnosis not present

## 2023-08-16 DIAGNOSIS — N062 Isolated proteinuria with diffuse membranous glomerulonephritis, unspecified: Secondary | ICD-10-CM | POA: Diagnosis not present

## 2023-08-16 DIAGNOSIS — E039 Hypothyroidism, unspecified: Secondary | ICD-10-CM | POA: Diagnosis not present

## 2023-08-16 DIAGNOSIS — E7800 Pure hypercholesterolemia, unspecified: Secondary | ICD-10-CM | POA: Diagnosis not present

## 2023-08-16 DIAGNOSIS — Z923 Personal history of irradiation: Secondary | ICD-10-CM | POA: Diagnosis not present

## 2023-08-16 DIAGNOSIS — N052 Unspecified nephritic syndrome with diffuse membranous glomerulonephritis: Secondary | ICD-10-CM | POA: Diagnosis not present

## 2023-08-16 DIAGNOSIS — N400 Enlarged prostate without lower urinary tract symptoms: Secondary | ICD-10-CM | POA: Diagnosis not present

## 2023-08-16 DIAGNOSIS — Z79899 Other long term (current) drug therapy: Secondary | ICD-10-CM | POA: Diagnosis not present

## 2023-08-16 DIAGNOSIS — S0003XA Contusion of scalp, initial encounter: Secondary | ICD-10-CM | POA: Diagnosis not present

## 2023-08-16 DIAGNOSIS — R410 Disorientation, unspecified: Secondary | ICD-10-CM | POA: Diagnosis not present

## 2023-08-16 DIAGNOSIS — M19011 Primary osteoarthritis, right shoulder: Secondary | ICD-10-CM | POA: Diagnosis not present

## 2023-08-16 DIAGNOSIS — E1122 Type 2 diabetes mellitus with diabetic chronic kidney disease: Secondary | ICD-10-CM | POA: Diagnosis not present

## 2023-08-16 DIAGNOSIS — G928 Other toxic encephalopathy: Secondary | ICD-10-CM | POA: Diagnosis not present

## 2023-08-16 DIAGNOSIS — N401 Enlarged prostate with lower urinary tract symptoms: Secondary | ICD-10-CM | POA: Diagnosis not present

## 2023-08-16 DIAGNOSIS — R4182 Altered mental status, unspecified: Secondary | ICD-10-CM | POA: Diagnosis not present

## 2023-08-16 DIAGNOSIS — R200 Anesthesia of skin: Secondary | ICD-10-CM | POA: Diagnosis not present

## 2023-08-16 DIAGNOSIS — I2510 Atherosclerotic heart disease of native coronary artery without angina pectoris: Secondary | ICD-10-CM | POA: Diagnosis not present

## 2023-08-16 DIAGNOSIS — R296 Repeated falls: Secondary | ICD-10-CM | POA: Diagnosis not present

## 2023-08-16 DIAGNOSIS — G5792 Unspecified mononeuropathy of left lower limb: Secondary | ICD-10-CM | POA: Diagnosis not present

## 2023-08-16 DIAGNOSIS — R918 Other nonspecific abnormal finding of lung field: Secondary | ICD-10-CM | POA: Diagnosis not present

## 2023-08-16 DIAGNOSIS — T50905D Adverse effect of unspecified drugs, medicaments and biological substances, subsequent encounter: Secondary | ICD-10-CM | POA: Diagnosis not present

## 2023-08-16 DIAGNOSIS — G7000 Myasthenia gravis without (acute) exacerbation: Secondary | ICD-10-CM | POA: Diagnosis not present

## 2023-08-16 DIAGNOSIS — Z7984 Long term (current) use of oral hypoglycemic drugs: Secondary | ICD-10-CM | POA: Diagnosis not present

## 2023-08-16 DIAGNOSIS — M1712 Unilateral primary osteoarthritis, left knee: Secondary | ICD-10-CM | POA: Diagnosis not present

## 2023-08-16 DIAGNOSIS — R339 Retention of urine, unspecified: Secondary | ICD-10-CM | POA: Diagnosis not present

## 2023-08-21 DIAGNOSIS — E0822 Diabetes mellitus due to underlying condition with diabetic chronic kidney disease: Secondary | ICD-10-CM | POA: Diagnosis not present

## 2023-08-21 DIAGNOSIS — G9340 Encephalopathy, unspecified: Secondary | ICD-10-CM | POA: Diagnosis not present

## 2023-08-21 DIAGNOSIS — N184 Chronic kidney disease, stage 4 (severe): Secondary | ICD-10-CM | POA: Diagnosis not present

## 2023-11-12 DIAGNOSIS — I129 Hypertensive chronic kidney disease with stage 1 through stage 4 chronic kidney disease, or unspecified chronic kidney disease: Secondary | ICD-10-CM | POA: Diagnosis not present

## 2023-11-12 DIAGNOSIS — N184 Chronic kidney disease, stage 4 (severe): Secondary | ICD-10-CM | POA: Diagnosis not present

## 2023-11-12 DIAGNOSIS — E118 Type 2 diabetes mellitus with unspecified complications: Secondary | ICD-10-CM | POA: Diagnosis not present

## 2023-11-12 DIAGNOSIS — E039 Hypothyroidism, unspecified: Secondary | ICD-10-CM | POA: Diagnosis not present

## 2023-11-19 DIAGNOSIS — Z1331 Encounter for screening for depression: Secondary | ICD-10-CM | POA: Diagnosis not present

## 2023-11-19 DIAGNOSIS — N184 Chronic kidney disease, stage 4 (severe): Secondary | ICD-10-CM | POA: Diagnosis not present

## 2023-11-19 DIAGNOSIS — E7800 Pure hypercholesterolemia, unspecified: Secondary | ICD-10-CM | POA: Diagnosis not present

## 2023-11-19 DIAGNOSIS — E118 Type 2 diabetes mellitus with unspecified complications: Secondary | ICD-10-CM | POA: Diagnosis not present

## 2023-11-19 DIAGNOSIS — I129 Hypertensive chronic kidney disease with stage 1 through stage 4 chronic kidney disease, or unspecified chronic kidney disease: Secondary | ICD-10-CM | POA: Diagnosis not present

## 2023-11-19 DIAGNOSIS — Z0000 Encounter for general adult medical examination without abnormal findings: Secondary | ICD-10-CM | POA: Diagnosis not present

## 2023-11-19 DIAGNOSIS — E039 Hypothyroidism, unspecified: Secondary | ICD-10-CM | POA: Diagnosis not present

## 2023-11-19 DIAGNOSIS — G7000 Myasthenia gravis without (acute) exacerbation: Secondary | ICD-10-CM | POA: Diagnosis not present

## 2023-12-29 ENCOUNTER — Encounter: Payer: Self-pay | Admitting: Oncology

## 2023-12-29 ENCOUNTER — Inpatient Hospital Stay: Admitting: Oncology

## 2023-12-29 ENCOUNTER — Inpatient Hospital Stay

## 2023-12-29 ENCOUNTER — Inpatient Hospital Stay: Attending: Oncology

## 2023-12-29 VITALS — BP 115/68 | HR 70 | Temp 97.7°F | Resp 18 | Wt 210.0 lb

## 2023-12-29 DIAGNOSIS — Z8249 Family history of ischemic heart disease and other diseases of the circulatory system: Secondary | ICD-10-CM | POA: Diagnosis not present

## 2023-12-29 DIAGNOSIS — R5383 Other fatigue: Secondary | ICD-10-CM | POA: Diagnosis not present

## 2023-12-29 DIAGNOSIS — Z85828 Personal history of other malignant neoplasm of skin: Secondary | ICD-10-CM | POA: Diagnosis not present

## 2023-12-29 DIAGNOSIS — Z08 Encounter for follow-up examination after completed treatment for malignant neoplasm: Secondary | ICD-10-CM

## 2023-12-29 DIAGNOSIS — K449 Diaphragmatic hernia without obstruction or gangrene: Secondary | ICD-10-CM | POA: Diagnosis not present

## 2023-12-29 DIAGNOSIS — Z803 Family history of malignant neoplasm of breast: Secondary | ICD-10-CM | POA: Diagnosis not present

## 2023-12-29 DIAGNOSIS — N184 Chronic kidney disease, stage 4 (severe): Secondary | ICD-10-CM | POA: Insufficient documentation

## 2023-12-29 DIAGNOSIS — G4730 Sleep apnea, unspecified: Secondary | ICD-10-CM | POA: Insufficient documentation

## 2023-12-29 DIAGNOSIS — Z79899 Other long term (current) drug therapy: Secondary | ICD-10-CM | POA: Diagnosis not present

## 2023-12-29 DIAGNOSIS — Z87442 Personal history of urinary calculi: Secondary | ICD-10-CM | POA: Insufficient documentation

## 2023-12-29 DIAGNOSIS — D751 Secondary polycythemia: Secondary | ICD-10-CM | POA: Diagnosis present

## 2023-12-29 DIAGNOSIS — Z825 Family history of asthma and other chronic lower respiratory diseases: Secondary | ICD-10-CM | POA: Diagnosis not present

## 2023-12-29 DIAGNOSIS — E1122 Type 2 diabetes mellitus with diabetic chronic kidney disease: Secondary | ICD-10-CM | POA: Diagnosis not present

## 2023-12-29 DIAGNOSIS — Z8572 Personal history of non-Hodgkin lymphomas: Secondary | ICD-10-CM

## 2023-12-29 DIAGNOSIS — Z8349 Family history of other endocrine, nutritional and metabolic diseases: Secondary | ICD-10-CM | POA: Insufficient documentation

## 2023-12-29 DIAGNOSIS — Z87891 Personal history of nicotine dependence: Secondary | ICD-10-CM | POA: Diagnosis not present

## 2023-12-29 DIAGNOSIS — N400 Enlarged prostate without lower urinary tract symptoms: Secondary | ICD-10-CM | POA: Insufficient documentation

## 2023-12-29 DIAGNOSIS — I129 Hypertensive chronic kidney disease with stage 1 through stage 4 chronic kidney disease, or unspecified chronic kidney disease: Secondary | ICD-10-CM | POA: Insufficient documentation

## 2023-12-29 LAB — COMPREHENSIVE METABOLIC PANEL WITH GFR
ALT: 27 U/L (ref 0–44)
AST: 24 U/L (ref 15–41)
Albumin: 3.7 g/dL (ref 3.5–5.0)
Alkaline Phosphatase: 79 U/L (ref 38–126)
Anion gap: 8 (ref 5–15)
BUN: 29 mg/dL — ABNORMAL HIGH (ref 8–23)
CO2: 28 mmol/L (ref 22–32)
Calcium: 9.5 mg/dL (ref 8.9–10.3)
Chloride: 101 mmol/L (ref 98–111)
Creatinine, Ser: 2.98 mg/dL — ABNORMAL HIGH (ref 0.61–1.24)
GFR, Estimated: 21 mL/min — ABNORMAL LOW (ref >60)
Glucose, Bld: 169 mg/dL — ABNORMAL HIGH (ref 70–99)
Potassium: 4 mmol/L (ref 3.5–5.1)
Sodium: 137 mmol/L (ref 135–145)
Total Bilirubin: 1 mg/dL (ref 0.0–1.2)
Total Protein: 6.8 g/dL (ref 6.5–8.1)

## 2023-12-29 LAB — CBC WITH DIFFERENTIAL/PLATELET
Abs Immature Granulocytes: 0.06 K/uL (ref 0.00–0.07)
Basophils Absolute: 0 K/uL (ref 0.0–0.1)
Basophils Relative: 1 %
Eosinophils Absolute: 0.1 K/uL (ref 0.0–0.5)
Eosinophils Relative: 2 %
HCT: 55.8 % — ABNORMAL HIGH (ref 39.0–52.0)
Hemoglobin: 18.4 g/dL — ABNORMAL HIGH (ref 13.0–17.0)
Immature Granulocytes: 1 %
Lymphocytes Relative: 13 %
Lymphs Abs: 0.9 K/uL (ref 0.7–4.0)
MCH: 31.5 pg (ref 26.0–34.0)
MCHC: 33 g/dL (ref 30.0–36.0)
MCV: 95.5 fL (ref 80.0–100.0)
Monocytes Absolute: 0.6 K/uL (ref 0.1–1.0)
Monocytes Relative: 9 %
Neutro Abs: 5.2 K/uL (ref 1.7–7.7)
Neutrophils Relative %: 74 %
Platelets: 148 K/uL — ABNORMAL LOW (ref 150–400)
RBC: 5.84 MIL/uL — ABNORMAL HIGH (ref 4.22–5.81)
RDW: 13.2 % (ref 11.5–15.5)
WBC: 7 K/uL (ref 4.0–10.5)
nRBC: 0 % (ref 0.0–0.2)

## 2023-12-29 NOTE — Progress Notes (Signed)
 Patient has been feeling a little better, no new questions for today's visit.

## 2023-12-29 NOTE — Progress Notes (Signed)
 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  2.4 cm hypoattenuating renal lesion consistent with a cyst he underwent EGD which showed widely patent Schatzki's ring in the distal esophagus.  Small hiatal hernia.  Any area of abnormal mucosa with multifocal areas of ulceration with heaped up edges as well as multiple pigmented spots within the ulcers found in the anterior 1/Dasovich of the stomach.  It appears that there is any mass-effect/extrinsic compression of the stomach versus primary gastric malignancy.  No active bleeding as of recent bleeding.  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 Ki67 5%. No rearrangement of MALT 1 was observed H. pylori immunostain negative.    Repeat stool antigen H. pylori testing was also negative.he received EBRT to the involved area in August 2021.  Repeat endoscopy showed no evidence of lymphoma on EGD    Patient also has history of polycythemia with a hemoglobin that fluctuates between 17-18.  JAK2,  CALR, MPL and exon 12 mutation negative.  Workup undergoing for sleep apnea.  He will be getting his CPAP machine in April 2025  Interval history-patient received his new CPAP machine in April 2025 but has not been consistent in wearing it.  He has baseline fatigue but denies any other new complaints at this time  ECOG PS- 1 Pain scale- 0   Review of systems- Review of Systems  Constitutional:  Negative for chills, fever, malaise/fatigue and weight loss.  HENT:  Negative for congestion, ear discharge and nosebleeds.   Eyes:  Negative for blurred vision.  Respiratory:  Negative for cough, hemoptysis, sputum production, shortness of breath and wheezing.   Cardiovascular:  Negative for chest pain, palpitations, orthopnea and claudication.  Gastrointestinal:  Negative for abdominal pain, blood in stool, constipation, diarrhea, heartburn, melena, nausea and vomiting.  Genitourinary:  Negative for dysuria, flank pain, frequency, hematuria and urgency.  Musculoskeletal:  Negative for back pain, joint pain and myalgias.  Skin:  Negative for rash.  Neurological:  Negative for dizziness, tingling, focal weakness, seizures, weakness and headaches.  Endo/Heme/Allergies:  Does not bruise/bleed easily.  Psychiatric/Behavioral:  Negative for depression and suicidal ideas. The patient does not have insomnia.       No Known Allergies   Past Medical History:  Diagnosis Date   Basal cell carcinoma 2020   back of neck   Cancer (HCC) 1991   malignant melanoma   Chronic kidney disease    Diabetes (HCC) 2023   Essential hypertension    Heart murmur    History of diverticulitis 06/2014   History of kidney stones    Hyperlipidemia  Iron deficiency anemia due to chronic blood loss 12/03/2019   MALT lymphoma 2021   Membranous glomerulonephritis    Myasthenia gravis (HCC)    Obesity      Past Surgical History:  Procedure Laterality Date   ESOPHAGOGASTRODUODENOSCOPY (EGD) WITH PROPOFOL  N/A  04/27/2020   Procedure: ESOPHAGOGASTRODUODENOSCOPY (EGD) WITH PROPOFOL ;  Surgeon: Unk Corinn Skiff, MD;  Location: [facility] ENDOSCOPY;  Service: Gastroenterology;  Laterality: N/A;   ESOPHAGOGASTRODUODENOSCOPY (EGD) WITH PROPOFOL  N/A 10/22/2022   Procedure: ESOPHAGOGASTRODUODENOSCOPY (EGD) WITH PROPOFOL ;  Surgeon: Unk Corinn Skiff, MD;  Location: [facility] ENDOSCOPY;  Service: Gastroenterology;  Laterality: N/A;   EYE SURGERY     cataract surgery done at [facility]    LITHOTRIPSY     submandibular gland removal Left     Social History   Socioeconomic History   Marital status: Married    Spouse name: Not on file   Number of children: Not on file   Years of education: Not on file   Highest education level: High school graduate  Occupational History   Not on file  Tobacco Use   Smoking status: Former    Current packs/day: 0.00    Types: Cigarettes    Quit date: 05/14/1969    Years since quitting: 54.6   Smokeless tobacco: Never  Vaping Use   Vaping status: Never Used  Substance and Sexual Activity   Alcohol use: No   Drug use: No   Sexual activity: Not Currently  Other Topics Concern   Not on file  Social History Narrative   Not on file   Social Drivers of Health   Financial Resource Strain: Low Risk  (11/18/2023)   Received from [facility]   Overall Financial Resource Strain (CARDIA)    Difficulty of Paying Living Expenses: Not hard at all  Food Insecurity: No Food Insecurity (11/18/2023)   Received from [facility]   Hunger Vital Sign    Within the past 12 months, you worried that your food would run out before you got the money to buy more.: Never true    Within the past 12 months, the food you bought just didn't last and you didn't have money to get more.: Never true  Transportation Needs: No Transportation Needs (08/17/2023)   Received from [facility] - Transportation    In the past 12 months, has lack of  transportation kept you from medical appointments or from getting medications?: No    Lack of Transportation (Non-Medical): No  Physical Activity: Insufficiently Active (12/21/2018)   Exercise Vital Sign    Days of Exercise per Week: 3 days    Minutes of Exercise per Session: 30 min  Stress: No Stress Concern Present (12/18/2017)   [employer] of Occupational Health - Occupational Stress Questionnaire    Feeling of Stress : Not at all  Social Connections: Moderately Integrated (12/18/2017)   Social Connection and Isolation Panel    Frequency of Communication with Friends and Family: More than three times a week    Frequency of Social Gatherings with Friends and Family: More than three times a week    Attends Religious Services: More than 4 times per year    Active Member of [employer] or Organizations: No    Attends Banker Meetings: Never    Marital Status: Married  Catering manager Violence: Not At Risk (12/18/2017)   Humiliation, Afraid, Rape, and Kick questionnaire    Fear of Current or Ex-Partner: No  Emotionally Abused: No    Physically Abused: No    Sexually Abused: No    Family History  Problem Relation Age of Onset   Hypertension Mother    Thyroid  disease Mother    Breast cancer Mother    Asthma Father      Current Outpatient Medications:    albuterol (VENTOLIN HFA) 108 (90 Base) MCG/ACT inhaler, Inhale into the lungs., Disp: , Rfl:    atorvastatin  (LIPITOR) 80 MG tablet, Take 80 mg by mouth daily., Disp: , Rfl:    dutasteride (AVODART) 0.5 MG capsule, , Disp: , Rfl:    empagliflozin (JARDIANCE) 25 MG TABS tablet, Take 1 tablet by mouth daily., Disp: , Rfl:    famotidine (PEPCID) 10 MG tablet, Take 10 mg by mouth daily., Disp: , Rfl:    finasteride (PROSCAR) 5 MG tablet, Take by mouth., Disp: , Rfl:    levothyroxine  (SYNTHROID ) 175 MCG tablet, Take 175 mcg by mouth daily., Disp: , Rfl:    losartan (COZAAR) 100 MG tablet, Take 50 mg by mouth., Disp: ,  Rfl:    methocarbamol  (ROBAXIN ) 500 MG tablet, Take 1 tablet (500 mg total) by mouth 2 (two) times daily., Disp: 20 tablet, Rfl: 0   Nutritional Supplements (THERALITH XR PO), Take by mouth 2 (two) times daily. Takes 2 tablets in AM and 2 tablets in PM, Disp: , Rfl:    omeprazole  (PRILOSEC ) 40 MG capsule, Take 1 capsule (40 mg total) by mouth daily before breakfast., Disp: 30 capsule, Rfl: 0   ondansetron  (ZOFRAN ) 8 MG tablet, Take 1 tablet (8 mg total) by mouth every 8 (eight) hours as needed for nausea or vomiting., Disp: 30 tablet, Rfl: 1   oxyCODONE  (ROXICODONE ) 5 MG immediate release tablet, Take 1 tablet (5 mg total) by mouth every 8 (eight) hours as needed for severe pain (pain score 7-10) or moderate pain (pain score 4-6)., Disp: 12 tablet, Rfl: 0   pyridostigmine (MESTINON) 60 MG tablet, Take 1 tablet by mouth 2 (two) times daily., Disp: , Rfl:    gabapentin (NEURONTIN) 300 MG capsule, Take by mouth., Disp: , Rfl:    ipratropium-albuterol (DUONEB) 0.5-2.5 (3) MG/3ML SOLN, Inhale into the lungs. (Patient not taking: Reported on 12/29/2023), Disp: , Rfl:    predniSONE  (STERAPRED UNI-PAK 21 TAB) 10 MG (21) TBPK tablet, Take by mouth daily. Take 6 tabs by mouth daily for 1, then 5 tabs for 1 day, then 4 tabs for 1 day, then 3 tabs for 1 day, then 2 tabs for 1 day, then 1 tab for 1 day. (Patient not taking: Reported on 12/29/2023), Disp: 21 tablet, Rfl: 0   scopolamine  (TRANSDERM-SCOP) 1 MG/3DAYS, Place onto the skin. (Patient not taking: Reported on 12/29/2023), Disp: , Rfl:    Semaglutide,0.25 or 0.5MG /DOS, 2 MG/3ML SOPN, Inject into the skin. (Patient not taking: Reported on 12/29/2023), Disp: , Rfl:    tamsulosin (FLOMAX) 0.4 MG CAPS capsule, Take by mouth. (Patient not taking: Reported on 12/29/2023), Disp: , Rfl:    triamcinolone cream (KENALOG) 0.1 %, APPLY SMALL AMOUNT TOPICALLY TWO TIMES A DAY AS NEEDED FOR RED RAISED ITCHY RASH. STOP WHEN SMOOTH. AVOID FACE, ARMPIT, AND GROIN (Patient not taking:  Reported on 12/29/2023), Disp: , Rfl:   Physical exam:  Vitals:   12/29/23 1054  BP: 115/68  Pulse: 70  Resp: 18  Temp: 97.7 F (36.5 C)  TempSrc: Tympanic  SpO2: 96%  Weight: 210 lb (95.3 kg)   Physical Exam Cardiovascular:  Rate and Rhythm: Normal rate and regular rhythm.     Heart sounds: Normal heart sounds.  Pulmonary:     Effort: Pulmonary effort is normal.     Breath sounds: Normal breath sounds.  Abdominal:     General: Bowel sounds are normal.     Palpations: Abdomen is soft.  Lymphadenopathy:     Comments: No palpable cervical, supraclavicular, axillary or inguinal adenopathy    Skin:    General: Skin is warm and dry.  Neurological:     Mental Status: He is alert and oriented to person, place, and time.      I have personally reviewed labs listed below:    Latest Ref Rng & Units 12/29/2023    9:47 AM  CMP  Glucose 70 - 99 mg/dL 830   BUN 8 - 23 mg/dL 29   Creatinine 9.38 - 1.24 mg/dL 7.01   Sodium 864 - 854 mmol/L 137   Potassium 3.5 - 5.1 mmol/L 4.0   Chloride 98 - 111 mmol/L 101   CO2 22 - 32 mmol/L 28   Calcium  8.9 - 10.3 mg/dL 9.5   Total Protein 6.5 - 8.1 g/dL 6.8   Total Bilirubin 0.0 - 1.2 mg/dL 1.0   Alkaline Phos 38 - 126 U/L 79   AST 15 - 41 U/L 24   ALT 0 - 44 U/L 27       Latest Ref Rng & Units 12/29/2023    9:47 AM  CBC  WBC 4.0 - 10.5 K/uL 7.0   Hemoglobin 13.0 - 17.0 g/dL 81.5   Hematocrit 60.9 - 52.0 % 55.8   Platelets 150 - 400 K/uL 148     Assessment and plan- Patient is a 78 y.o. male who is here for follow-up of following issues  Polycythemia likely secondary: Patient's hematocrit has been consistently between 50-55 over the last year.  Today it is more than 55 and I will proceed with 1 session of phlebotomy at this time.  Likely etiology is sleep apnea given that JAK2, CALR and MPL mutation testing was negative.  However given his persistent polycythemia would like to proceed with bone marrow biopsy at this time and I would  like to order myeloid mutation panel on that specimen to rule out a primary myeloproliferative disorder.  I will tentatively see him back in 5 to 6 weeks time to discuss the results of bone marrow biopsy.  If there is no evidence of myeloproliferative disorder on his bone marrow, I will plan to have hematocrit of greater than 55 in the future for periodic phlebotomy.  I have encouraged him to be compliant with his CPAP as well.  History of MALT lymphoma H. pylori negative s/p EBRT in August 2021: Clinically patient is doing well with no evidence of recurrence based on today's exam.  I will continue to follow this on a every 59-month basis   Visit Diagnosis 1. Polycythemia, secondary   2. Encounter for follow-up surveillance of lymphoma      Dr. Annah Skene, MD, MPH [facility] [phone] 12/29/2023 6:44 PM

## 2023-12-29 NOTE — Progress Notes (Signed)
 Bill Aguilar presents today for phlebotomy per MD orders. Phlebotomy procedure started at 1150 and ended at 1200. 300 mls  removed. Patient tolerated procedure well. IV needle removed intact.

## 2023-12-29 NOTE — Patient Instructions (Signed)

## 2023-12-30 ENCOUNTER — Telehealth: Payer: Self-pay | Admitting: *Deleted

## 2023-12-30 NOTE — Telephone Encounter (Signed)
 Called and gave patient appointment information for bone marrow biopsy on Wednesday 01/07/24, arrive at 830 a for a 930 a procedure.  Instructed nothing to eat or drink after midnight and patient needs to have a driver. Pt verbalized understanding.

## 2024-01-05 ENCOUNTER — Ambulatory Visit: Payer: PPO | Admitting: Oncology

## 2024-01-05 ENCOUNTER — Other Ambulatory Visit (HOSPITAL_COMMUNITY): Payer: Self-pay | Admitting: Student

## 2024-01-05 ENCOUNTER — Other Ambulatory Visit: Payer: PPO

## 2024-01-05 DIAGNOSIS — Z8572 Personal history of non-Hodgkin lymphomas: Secondary | ICD-10-CM

## 2024-01-05 NOTE — H&P (Incomplete)
 Chief Complaint: Patient was seen in consultation today for polycythemia.   Referring Physician(s): Melanee Annah BROCKS  Supervising Physician: Vanice Revel  Patient Status: [facility] - Out-pt  History of Present Illness: Bill Aguilar is a 78 y.o. male with a medical history significant for stage IV CKD, BPH, polycythemia, sleep apnea, DM and MALT lymphoma of the stomach. Recent labs show his hemoglobin, hematocrit and RBC levels have increased. His Oncology team has recommended a bone marrow biopsy with aspiration for further work up.   Past Medical History:  Diagnosis Date   Basal cell carcinoma 2020   back of neck   Cancer (HCC) 1991   malignant melanoma   Chronic kidney disease    Diabetes (HCC) 2023   Essential hypertension    Heart murmur    History of diverticulitis 06/2014   History of kidney stones    Hyperlipidemia    Iron deficiency anemia due to chronic blood loss 12/03/2019   MALT lymphoma 2021   Membranous glomerulonephritis    Myasthenia gravis (HCC)    Obesity     Past Surgical History:  Procedure Laterality Date   ESOPHAGOGASTRODUODENOSCOPY (EGD) WITH PROPOFOL  N/A 04/27/2020   Procedure: ESOPHAGOGASTRODUODENOSCOPY (EGD) WITH PROPOFOL ;  Surgeon: Unk Corinn Skiff, MD;  Location: [facility] ENDOSCOPY;  Service: Gastroenterology;  Laterality: N/A;   ESOPHAGOGASTRODUODENOSCOPY (EGD) WITH PROPOFOL  N/A 10/22/2022   Procedure: ESOPHAGOGASTRODUODENOSCOPY (EGD) WITH PROPOFOL ;  Surgeon: Unk Corinn Skiff, MD;  Location: [facility] ENDOSCOPY;  Service: Gastroenterology;  Laterality: N/A;   EYE SURGERY     cataract surgery done at [facility]    LITHOTRIPSY     submandibular gland removal Left     Allergies: Patient has no known allergies.  Medications: Prior to Admission medications   Medication Sig Start Date End Date Taking? Authorizing Provider  albuterol (VENTOLIN HFA) 108 (90 Base) MCG/ACT inhaler Inhale into the lungs. 11/16/20 12/29/23  [provider]   atorvastatin  (LIPITOR) 80 MG tablet Take 80 mg by mouth daily.    [provider]  dutasteride (AVODART) 0.5 MG capsule  09/11/20   [provider]  empagliflozin (JARDIANCE) 25 MG TABS tablet Take 1 tablet by mouth daily. 05/22/21   [provider]  famotidine (PEPCID) 10 MG tablet Take 10 mg by mouth daily.    [provider]  finasteride (PROSCAR) 5 MG tablet Take by mouth. 08/28/21   [provider]  gabapentin (NEURONTIN) 300 MG capsule Take by mouth.    [provider]  ipratropium-albuterol (DUONEB) 0.5-2.5 (3) MG/3ML SOLN Inhale into the lungs. Patient not taking: Reported on 12/29/2023 06/10/23   [provider]  levothyroxine  (SYNTHROID ) 175 MCG tablet Take 175 mcg by mouth daily. 07/17/21   [provider]  losartan (COZAAR) 100 MG tablet Take 50 mg by mouth. 06/01/23   [provider]  methocarbamol  (ROBAXIN ) 500 MG tablet Take 1 tablet (500 mg total) by mouth 2 (two) times daily. 07/02/23   Brimage, Vondra, DO  Nutritional Supplements (THERALITH XR PO) Take by mouth 2 (two) times daily. Takes 2 tablets in AM and 2 tablets in PM    [provider]  omeprazole  (PRILOSEC ) 40 MG capsule Take 1 capsule (40 mg total) by mouth daily before breakfast. 10/29/22   Vanga, Rohini Reddy, MD  ondansetron  (ZOFRAN ) 8 MG tablet Take 1 tablet (8 mg total) by mouth every 8 (eight) hours as needed for nausea or vomiting. 07/23/21   Unk Corinn Skiff, MD  oxyCODONE  (ROXICODONE ) 5 MG immediate release tablet  Take 1 tablet (5 mg total) by mouth every 8 (eight) hours as needed for severe pain (pain score 7-10) or moderate pain (pain score 4-6). 07/02/23   Brimage, Vondra, DO  predniSONE  (STERAPRED UNI-PAK 21 TAB) 10 MG (21) TBPK tablet Take by mouth daily. Take 6 tabs by mouth daily for 1, then 5 tabs for 1 day, then 4 tabs for 1 day, then 3 tabs for 1 day, then 2 tabs for 1 day, then 1 tab for 1 day. Patient not taking: Reported on  12/29/2023 07/02/23   Brimage, Vondra, DO  pyridostigmine (MESTINON) 60 MG tablet Take 1 tablet by mouth 2 (two) times daily. 09/11/20   [provider]  scopolamine  (TRANSDERM-SCOP) 1 MG/3DAYS Place onto the skin. Patient not taking: Reported on 12/29/2023 04/12/22   [provider]  Semaglutide,0.25 or 0.5MG /DOS, 2 MG/3ML SOPN Inject into the skin. Patient not taking: Reported on 12/29/2023 12/06/22   [provider]  tamsulosin (FLOMAX) 0.4 MG CAPS capsule Take by mouth. Patient not taking: Reported on 12/29/2023 08/28/21   [provider]  triamcinolone cream (KENALOG) 0.1 % APPLY SMALL AMOUNT TOPICALLY TWO TIMES A DAY AS NEEDED FOR RED RAISED ITCHY RASH. STOP WHEN SMOOTH. AVOID FACE, ARMPIT, AND GROIN Patient not taking: Reported on 12/29/2023 05/03/21   [provider]     Family History  Problem Relation Age of Onset   Hypertension Mother    Thyroid  disease Mother    Breast cancer Mother    Asthma Father     Social History   Socioeconomic History   Marital status: Married    Spouse name: Not on file   Number of children: Not on file   Years of education: Not on file   Highest education level: High school graduate  Occupational History   Not on file  Tobacco Use   Smoking status: Former    Current packs/day: 0.00    Types: Cigarettes    Quit date: 05/14/1969    Years since quitting: 54.6   Smokeless tobacco: Never  Vaping Use   Vaping status: Never Used  Substance and Sexual Activity   Alcohol use: No   Drug use: No   Sexual activity: Not Currently  Other Topics Concern   Not on file  Social History Narrative   Not on file   Social Drivers of Health   Financial Resource Strain: Low Risk  (11/18/2023)   Received from [facility]   Overall Financial Resource Strain (CARDIA)    Difficulty of Paying Living Expenses: Not hard at all  Food Insecurity: No Food Insecurity (11/18/2023)   Received from [facility]   Hunger Vital Sign    Within the past 12 months, you worried that your food would run out before you got the money to buy more.: Never true    Within the past 12 months, the food you bought just didn't last and you didn't have money to get more.: Never true  Transportation Needs: No Transportation Needs (08/17/2023)   Received from [facility] - Transportation    In the past 12 months, has lack of transportation kept you from medical appointments or from getting medications?: No    Lack of Transportation (Non-Medical): No  Physical Activity: Insufficiently Active (12/21/2018)   Exercise Vital Sign    Days of Exercise per Week: 3 days    Minutes of Exercise per Session: 30 min  Stress: No Stress Concern Present (  12/18/2017)   [employer] - Occupational Stress Questionnaire    Feeling of Stress : Not at all  Social Connections: Moderately Integrated (12/18/2017)   Social Connection and Isolation Panel    Frequency of Communication with Friends and Family: More than three times a week    Frequency of Social Gatherings with Friends and Family: More than three times a week    Attends Religious Services: More than 4 times per year    Active Member of [employer] or Organizations: No    Attends Banker Meetings: Never    Marital Status: Married    Review of Systems: A 12 point ROS discussed and pertinent positives are indicated in the HPI above.  All other systems are negative.  Review of Systems  Vital Signs: There were no vitals taken for this visit.  Physical Exam  Imaging: No results found.  Labs:  CBC: Recent Labs    04/04/23 0951 07/04/23 0956 12/29/23 0947  WBC 6.4 11.3* 7.0  HGB 17.6* 17.7* 18.4*  HCT 53.2* 53.0* 55.8*  PLT 158 166 148*    COAGS: No results for input(s): INR, APTT in the last 8760 hours.  BMP: Recent Labs    12/29/23 0947  NA 137  K 4.0  CL 101  CO2 28   GLUCOSE 169*  BUN 29*  CALCIUM  9.5  CREATININE 2.98*  GFRNONAA 21*    LIVER FUNCTION TESTS: Recent Labs    12/29/23 0947  BILITOT 1.0  AST 24  ALT 27  ALKPHOS 79  PROT 6.8  ALBUMIN 3.7    TUMOR MARKERS: No results for input(s): AFPTM, CEA, CA199, CHROMGRNA in the last 8760 hours.  Assessment and Plan:  Polycythemia: Alm BROCKS. Arvanitis, 78 year old male, presents today to the [facility] Interventional Radiology department for an image-guided bone marrow biopsy with aspiration.   Risks and benefits of this procedure were discussed with the patient and/or patient's family including, but not limited to bleeding, infection, damage to adjacent structures or low yield requiring additional tests.  All of the questions were answered and there is agreement to proceed. He has been NPO. He is a full code.   Consent signed and in chart.  Thank you for this interesting consult.  I greatly enjoyed meeting Bill Aguilar and look forward to participating in their care.  A copy of this report was sent to the requesting provider on this date.  Electronically Signed: Warren Dais, AGACNP-BC 01/05/2024, 12:15 PM   I spent a total of  30 Minutes   in face to face in clinical consultation, greater than 50% of which was counseling/coordinating care for polycythemia.

## 2024-01-06 ENCOUNTER — Other Ambulatory Visit: Payer: Self-pay | Admitting: Diagnostic Radiology

## 2024-01-06 NOTE — Progress Notes (Signed)
 Patient for IR Bone Marrow Biopsy on Wed 01/07/24, I called and LVM for the patient on the phone and gave pre-procedure instructions. VM made the patient aware to be here at 8:30a, NPO after MN prior to procedure as well as driver post procedure/recovery/discharge. Called 01/06/24

## 2024-01-07 ENCOUNTER — Encounter: Payer: Self-pay | Admitting: Radiology

## 2024-01-07 ENCOUNTER — Other Ambulatory Visit: Payer: Self-pay

## 2024-01-07 ENCOUNTER — Ambulatory Visit
Admission: RE | Admit: 2024-01-07 | Discharge: 2024-01-07 | Disposition: A | Discharge status: Home / Self Care | Source: Ambulatory Visit | Attending: Oncology | Admitting: Oncology

## 2024-01-07 ENCOUNTER — Telehealth: Payer: Self-pay

## 2024-01-07 DIAGNOSIS — C884 Extranodal marginal zone b-cell lymphoma of mucosa-associated lymphoid tissue (malt-lymphoma) not having achieved remission: Secondary | ICD-10-CM | POA: Diagnosis not present

## 2024-01-07 DIAGNOSIS — Z87891 Personal history of nicotine dependence: Secondary | ICD-10-CM | POA: Insufficient documentation

## 2024-01-07 DIAGNOSIS — D751 Secondary polycythemia: Secondary | ICD-10-CM | POA: Insufficient documentation

## 2024-01-07 DIAGNOSIS — Z85828 Personal history of other malignant neoplasm of skin: Secondary | ICD-10-CM | POA: Diagnosis not present

## 2024-01-07 DIAGNOSIS — Z8572 Personal history of non-Hodgkin lymphomas: Secondary | ICD-10-CM

## 2024-01-07 HISTORY — PX: IR BONE MARROW BIOPSY & ASPIRATION: IMG5727

## 2024-01-07 LAB — CBC WITH DIFFERENTIAL/PLATELET
Abs Immature Granulocytes: 0.08 K/uL — ABNORMAL HIGH (ref 0.00–0.07)
Basophils Absolute: 0.1 K/uL (ref 0.0–0.1)
Basophils Relative: 1 %
Eosinophils Absolute: 0.2 K/uL (ref 0.0–0.5)
Eosinophils Relative: 3 %
HCT: 53.9 % — ABNORMAL HIGH (ref 39.0–52.0)
Hemoglobin: 17.7 g/dL — ABNORMAL HIGH (ref 13.0–17.0)
Immature Granulocytes: 1 %
Lymphocytes Relative: 13 %
Lymphs Abs: 0.8 K/uL (ref 0.7–4.0)
MCH: 31 pg (ref 26.0–34.0)
MCHC: 32.8 g/dL (ref 30.0–36.0)
MCV: 94.4 fL (ref 80.0–100.0)
Monocytes Absolute: 0.6 K/uL (ref 0.1–1.0)
Monocytes Relative: 11 %
Neutro Abs: 4.1 K/uL (ref 1.7–7.7)
Neutrophils Relative %: 71 %
Platelets: 160 K/uL (ref 150–400)
RBC: 5.71 MIL/uL (ref 4.22–5.81)
RDW: 13.4 % (ref 11.5–15.5)
Smear Review: NORMAL
WBC: 5.8 K/uL (ref 4.0–10.5)
nRBC: 0 % (ref 0.0–0.2)

## 2024-01-07 LAB — GLUCOSE, CAPILLARY: Glucose-Capillary: 158 mg/dL — ABNORMAL HIGH (ref 70–99)

## 2024-01-07 MED ORDER — SODIUM CHLORIDE 0.9 % IV SOLN: INTRAVENOUS | Status: DC

## 2024-01-07 MED ORDER — FENTANYL CITRATE (PF) 100 MCG/2ML IJ SOLN
INTRAMUSCULAR | Status: AC | PRN
  Administered 2024-01-07: 25 ug via INTRAVENOUS
  Administered 2024-01-07: 50 ug via INTRAVENOUS

## 2024-01-07 MED ORDER — HEPARIN SOD (PORK) LOCK FLUSH 100 UNIT/ML IV SOLN
INTRAVENOUS | Status: AC
  Filled 2024-01-07: qty 5

## 2024-01-07 MED ORDER — MIDAZOLAM HCL 2 MG/2ML IJ SOLN
INTRAMUSCULAR | Status: AC
  Filled 2024-01-07: qty 4

## 2024-01-07 MED ORDER — MIDAZOLAM HCL 2 MG/2ML IJ SOLN
INTRAMUSCULAR | Status: AC | PRN
Start: 2024-01-07 — End: 2024-01-07
  Administered 2024-01-07: .5 mg via INTRAVENOUS
  Administered 2024-01-07: 1 mg via INTRAVENOUS

## 2024-01-07 MED ORDER — FENTANYL CITRATE (PF) 100 MCG/2ML IJ SOLN
INTRAMUSCULAR | Status: AC
  Filled 2024-01-07: qty 2

## 2024-01-07 NOTE — Procedures (Signed)
 Interventional Radiology Procedure Note  Procedure: IR FLUORO RT ILIAC BM ASP AND CORE    Complications: None  Estimated Blood Loss:  MIN  Findings: 11 G CORE AND ASP    M. TREVOR Lainey Nelson, MD

## 2024-01-07 NOTE — Discharge Instructions (Signed)
 Bone Marrow Aspiration and Bone Marrow Biopsy, Adult, Care After This sheet gives you information about how to care for yourself after your procedure. If you have problems or questions, contact your health care provider.  What can I expect after the procedure?  After the procedure, it is common to have: Mild pain and tenderness. Swelling. Bruising.  Follow these instructions at home: Take over-the-counter or prescription medicines only as told by your health care provider. You may shower tomorrow Remove band aid tomorrow, replace with another bandaid if  site has any drainage from biopsy site. Wash your hands with soap and water before you touch your biopsy site  If soap and water are not available, use hand sanitizer. Change your dressing frequently for bleeding and/or drainage. Check your puncture site every day for signs of infection. Check for: More redness, swelling, or pain. More fluid or blood. Warmth. Pus or a bad smell. Return to your normal activities in 24hours.  Do not drive for 24 hours if you were given a medicine to help you relax (sedative). Keep all follow-up visits as told by your health care provider. This is important. Contact a health care provider if: You have more redness, swelling, or pain around the puncture site. You have more fluid or blood coming from the puncture site. Your puncture site feels warm to the touch. You have pus or a bad smell coming from the puncture site. You have a fever. Your pain is not controlled with medicine. This information is not intended to replace advice given to you by your health care provider. Make sure you discuss any questions you have with your health care provider. Document Released: 12/28/2004 Document Revised: 12/29/2015 Document Reviewed: 11/22/2015 Elsevier Interactive Patient Education  2018 [employer].

## 2024-01-07 NOTE — Telephone Encounter (Signed)
 BM BX completed earlier today.  Dr. Melanee would you like me to add a NGS panel or FISH; can also verify if flow cytometry was already added.  Please advise.

## 2024-01-09 ENCOUNTER — Encounter: Payer: Self-pay | Admitting: Oncology

## 2024-01-09 LAB — SURGICAL PATHOLOGY

## 2024-01-09 NOTE — Telephone Encounter (Signed)
 Per Dr. Melanee add on Myeloid mutation panel.SABRA Finch call spoke to Tammy who requested an email be sent requesting the add on; email sent to [email or web address] requesting add on and to verify flow cytometry has already been added.

## 2024-01-12 NOTE — Telephone Encounter (Signed)
 Email received from [facility] There is Flow on that case in power path [phone].  I added the NGS Myeloid disorders panel just now..  Will keep note open until panel is added.

## 2024-01-15 ENCOUNTER — Telehealth: Payer: Self-pay | Admitting: *Deleted

## 2024-01-15 NOTE — Telephone Encounter (Signed)
 NGS Myeloid and flow added to bm bx 01/12/24.  Spoke to Natalie who indicated that the NGS add on is 40% completed; may not be ready until early next week.  Outbound call; informed not ready yet and it may be till middle - end of next week before it's finalized and a provider reviews.  Will call patient next week with follow up information.

## 2024-01-15 NOTE — Telephone Encounter (Signed)
 This patient called today and said he has not heard about the results of the bone marrow biopsy.  He would like someone to give him a call to go over that information.

## 2024-01-15 NOTE — Telephone Encounter (Signed)
 Refer to phone note started 01/15/24 by Joen to follow up on NGS Myeloid panel, forwarding to provider for review upon finalization and contacting patient post provider advisement.

## 2024-01-16 ENCOUNTER — Encounter (HOSPITAL_COMMUNITY): Payer: Self-pay | Admitting: Oncology

## 2024-01-16 NOTE — Telephone Encounter (Signed)
 Myeloid panel added to bm bx; forwarded to providers for review.

## 2024-01-19 ENCOUNTER — Encounter: Payer: Self-pay | Admitting: Oncology

## 2024-01-19 NOTE — Progress Notes (Signed)
 Called patient results of the bone marrow-negative for malignancy.  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 Melanee for further discussion/management. GB

## 2024-01-20 ENCOUNTER — Encounter (HOSPITAL_COMMUNITY): Payer: Self-pay | Admitting: Oncology

## 2024-01-20 NOTE — Telephone Encounter (Signed)
 Per Dr. KATHEE Fortis patient results of the bone marrow-negative for malignancy.  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 Melanee for further discussion/management. GB.

## 2024-02-10 ENCOUNTER — Inpatient Hospital Stay: Admitting: Oncology

## 2024-02-10 ENCOUNTER — Inpatient Hospital Stay

## 2024-02-10 ENCOUNTER — Inpatient Hospital Stay: Attending: Oncology

## 2024-02-10 ENCOUNTER — Encounter: Payer: Self-pay | Admitting: Oncology

## 2024-02-10 VITALS — BP 115/67 | HR 78 | Temp 96.6°F | Resp 19 | Ht 69.0 in | Wt 209.1 lb

## 2024-02-10 DIAGNOSIS — K449 Diaphragmatic hernia without obstruction or gangrene: Secondary | ICD-10-CM | POA: Insufficient documentation

## 2024-02-10 DIAGNOSIS — G4730 Sleep apnea, unspecified: Secondary | ICD-10-CM | POA: Insufficient documentation

## 2024-02-10 DIAGNOSIS — G7000 Myasthenia gravis without (acute) exacerbation: Secondary | ICD-10-CM | POA: Insufficient documentation

## 2024-02-10 DIAGNOSIS — Z87891 Personal history of nicotine dependence: Secondary | ICD-10-CM | POA: Insufficient documentation

## 2024-02-10 DIAGNOSIS — D751 Secondary polycythemia: Secondary | ICD-10-CM | POA: Diagnosis not present

## 2024-02-10 DIAGNOSIS — N400 Enlarged prostate without lower urinary tract symptoms: Secondary | ICD-10-CM | POA: Diagnosis not present

## 2024-02-10 DIAGNOSIS — Z8349 Family history of other endocrine, nutritional and metabolic diseases: Secondary | ICD-10-CM | POA: Insufficient documentation

## 2024-02-10 DIAGNOSIS — Z7952 Long term (current) use of systemic steroids: Secondary | ICD-10-CM | POA: Diagnosis not present

## 2024-02-10 DIAGNOSIS — Z825 Family history of asthma and other chronic lower respiratory diseases: Secondary | ICD-10-CM | POA: Diagnosis not present

## 2024-02-10 DIAGNOSIS — Z803 Family history of malignant neoplasm of breast: Secondary | ICD-10-CM | POA: Diagnosis not present

## 2024-02-10 DIAGNOSIS — I129 Hypertensive chronic kidney disease with stage 1 through stage 4 chronic kidney disease, or unspecified chronic kidney disease: Secondary | ICD-10-CM | POA: Insufficient documentation

## 2024-02-10 DIAGNOSIS — Z79899 Other long term (current) drug therapy: Secondary | ICD-10-CM | POA: Insufficient documentation

## 2024-02-10 DIAGNOSIS — E1122 Type 2 diabetes mellitus with diabetic chronic kidney disease: Secondary | ICD-10-CM | POA: Insufficient documentation

## 2024-02-10 DIAGNOSIS — Z8249 Family history of ischemic heart disease and other diseases of the circulatory system: Secondary | ICD-10-CM | POA: Insufficient documentation

## 2024-02-10 DIAGNOSIS — Z87442 Personal history of urinary calculi: Secondary | ICD-10-CM | POA: Diagnosis not present

## 2024-02-10 DIAGNOSIS — N184 Chronic kidney disease, stage 4 (severe): Secondary | ICD-10-CM | POA: Insufficient documentation

## 2024-02-10 DIAGNOSIS — Z7984 Long term (current) use of oral hypoglycemic drugs: Secondary | ICD-10-CM | POA: Insufficient documentation

## 2024-02-10 DIAGNOSIS — Z85828 Personal history of other malignant neoplasm of skin: Secondary | ICD-10-CM | POA: Diagnosis not present

## 2024-02-10 LAB — CBC WITH DIFFERENTIAL (CANCER CENTER ONLY)
Abs Immature Granulocytes: 0.1 K/uL — ABNORMAL HIGH (ref 0.00–0.07)
Basophils Absolute: 0 K/uL (ref 0.0–0.1)
Basophils Relative: 1 %
Eosinophils Absolute: 0.2 K/uL (ref 0.0–0.5)
Eosinophils Relative: 3 %
HCT: 53.3 % — ABNORMAL HIGH (ref 39.0–52.0)
Hemoglobin: 17.9 g/dL — ABNORMAL HIGH (ref 13.0–17.0)
Immature Granulocytes: 1 %
Lymphocytes Relative: 13 %
Lymphs Abs: 0.9 K/uL (ref 0.7–4.0)
MCH: 31.7 pg (ref 26.0–34.0)
MCHC: 33.6 g/dL (ref 30.0–36.0)
MCV: 94.5 fL (ref 80.0–100.0)
Monocytes Absolute: 0.7 K/uL (ref 0.1–1.0)
Monocytes Relative: 11 %
Neutro Abs: 4.9 K/uL (ref 1.7–7.7)
Neutrophils Relative %: 71 %
Platelet Count: 153 K/uL (ref 150–400)
RBC: 5.64 MIL/uL (ref 4.22–5.81)
RDW: 13.8 % (ref 11.5–15.5)
WBC Count: 6.9 K/uL (ref 4.0–10.5)
nRBC: 0 % (ref 0.0–0.2)

## 2024-02-10 NOTE — Progress Notes (Signed)
 No phlebotomy needed today per provider.

## 2024-02-10 NOTE — Progress Notes (Signed)
 Hematology/Oncology Consult note 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  2.4 cm hypoattenuating renal lesion consistent with a cyst he underwent EGD which showed widely patent Schatzki's ring in the distal esophagus.  Small hiatal hernia.  Any area of abnormal mucosa with multifocal areas of ulceration with heaped up edges as well as multiple pigmented spots within the ulcers found in the anterior 1/Dasovich of the stomach.  It appears that there is any mass-effect/extrinsic compression of the stomach versus primary gastric malignancy.  No active bleeding as of recent bleeding.  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 Ki67 5%. No rearrangement of MALT 1 was observed H. pylori immunostain negative.    Repeat stool antigen H. pylori testing was also negative.he received EBRT to the involved area in August 2021.  Repeat endoscopy showed no evidence of lymphoma on EGD    Patient also has history of polycythemia with a hemoglobin that fluctuates between 17-18.  JAK2, CALR, MPL and exon 12 mutation  negative.  Patient was diagnosed with sleep apnea and was given a CPAP machine in April 2025.  However he has problems and possible allergy to the CPAP strap and is in need of a new machine  Interval history-overall he is doing well and denies any specific complaints at this time  ECOG PS- 1 Pain scale- 0   Review of systems- Review of Systems  Constitutional:  Negative for chills, fever, malaise/fatigue and weight loss.  HENT:  Negative for congestion, ear discharge and nosebleeds.   Eyes:  Negative for blurred vision.  Respiratory:  Negative for cough, hemoptysis, sputum production, shortness of breath and wheezing.   Cardiovascular:  Negative for chest pain, palpitations, orthopnea and claudication.  Gastrointestinal:  Negative for abdominal pain, blood in stool, constipation, diarrhea, heartburn, melena, nausea and vomiting.  Genitourinary:  Negative for dysuria, flank pain, frequency, hematuria and urgency.  Musculoskeletal:  Negative for back pain, joint pain and myalgias.  Skin:  Negative for rash.  Neurological:  Negative for dizziness, tingling, focal weakness, seizures, weakness and headaches.  Endo/Heme/Allergies:  Does not bruise/bleed easily.  Psychiatric/Behavioral:  Negative for depression and suicidal ideas. The patient does not have insomnia.       Allergies  Allergen Reactions   Scopolamine  Other (See Comments)    Delirium with use     Past Medical History:  Diagnosis Date   Basal cell carcinoma 2020   back of neck   Cancer (HCC) 1991   malignant melanoma   Chronic kidney disease    Diabetes (HCC) 2023   Essential hypertension    Heart murmur    History of diverticulitis  06/2014   History of kidney stones    Hyperlipidemia    Iron deficiency anemia due to chronic blood loss 12/03/2019   MALT lymphoma 2021   Membranous glomerulonephritis    Myasthenia gravis (HCC)    Obesity      Past Surgical History:  Procedure Laterality Date    ESOPHAGOGASTRODUODENOSCOPY (EGD) WITH PROPOFOL  N/A 04/27/2020   Procedure: ESOPHAGOGASTRODUODENOSCOPY (EGD) WITH PROPOFOL ;  Surgeon: Unk Corinn Skiff, MD;  Location: [facility] ENDOSCOPY;  Service: Gastroenterology;  Laterality: N/A;   ESOPHAGOGASTRODUODENOSCOPY (EGD) WITH PROPOFOL  N/A 10/22/2022   Procedure: ESOPHAGOGASTRODUODENOSCOPY (EGD) WITH PROPOFOL ;  Surgeon: Unk Corinn Skiff, MD;  Location: [facility] ENDOSCOPY;  Service: Gastroenterology;  Laterality: N/A;   EYE SURGERY     cataract surgery done at [facility]    IR BONE MARROW BIOPSY & ASPIRATION  01/07/2024   LITHOTRIPSY     submandibular gland removal Left     Social History   Socioeconomic History   Marital status: Married    Spouse name: Not on file   Number of children: Not on file   Years of education: Not on file   Highest education level: High school graduate  Occupational History   Not on file  Tobacco Use   Smoking status: Former    Current packs/day: 0.00    Types: Cigarettes    Quit date: 05/14/1969    Years since quitting: 54.7   Smokeless tobacco: Never  Vaping Use   Vaping status: Never Used  Substance and Sexual Activity   Alcohol use: No   Drug use: No   Sexual activity: Not Currently  Other Topics Concern   Not on file  Social History Narrative   Not on file   Social Drivers of Health   Financial Resource Strain: Low Risk  (11/18/2023)   Received from [facility]   Overall Financial Resource Strain (CARDIA)    Difficulty of Paying Living Expenses: Not hard at all  Food Insecurity: No Food Insecurity (11/18/2023)   Received from [facility]   Hunger Vital Sign    Within the past 12 months, you worried that your food would run out before you got the money to buy more.: Never true    Within the past 12 months, the food you bought just didn't last and you didn't have money to get more.: Never true  Transportation Needs: No Transportation Needs (08/17/2023)   Received from [facility] - Transportation    In the past 12 months, has lack of transportation kept you from medical appointments or from getting medications?: No    Lack of Transportation (Non-Medical): No  Physical Activity: Insufficiently Active (12/21/2018)   Exercise Vital Sign    Days of Exercise per Week: 3 days    Minutes of Exercise per Session: 30 min  Stress: No Stress Concern Present (12/18/2017)   [employer] of Occupational Health - Occupational Stress Questionnaire    Feeling of Stress : Not at all  Social Connections: Moderately Integrated (12/18/2017)   Social Connection and Isolation Panel    Frequency of Communication with Friends and Family: More than three times a week    Frequency of Social Gatherings with Friends and Family: More than three times a week    Attends Religious Services: More than 4 times per year    Active Member of [employer] or Organizations: No    Attends Banker Meetings: Never    Marital Status: Married  Intimate  Partner Violence: Not At Risk (12/18/2017)   Humiliation, Afraid, Rape, and Kick questionnaire    Fear of Current or Ex-Partner: No    Emotionally Abused: No    Physically Abused: No    Sexually Abused: No    Family History  Problem Relation Age of Onset   Hypertension Mother    Thyroid  disease Mother    Breast cancer Mother    Asthma Father      Current Outpatient Medications:    albuterol (VENTOLIN HFA) 108 (90 Base) MCG/ACT inhaler, Inhale into the lungs., Disp: , Rfl:    atorvastatin  (LIPITOR) 80 MG tablet, Take 80 mg by mouth daily., Disp: , Rfl:    finasteride (PROSCAR) 5 MG tablet, Take by mouth., Disp: , Rfl:    guaiFENesin  (MUCINEX ) 600 MG 12 hr tablet, Take 600 mg by mouth., Disp: , Rfl:    levothyroxine  (SYNTHROID ) 175 MCG tablet, Take 175 mcg by mouth daily., Disp: , Rfl:    losartan (COZAAR) 100 MG tablet, Take 50 mg by mouth., Disp: , Rfl:    Nutritional Supplements (THERALITH XR PO), Take  by mouth 2 (two) times daily. Takes 2 tablets in AM and 2 tablets in PM, Disp: , Rfl:    ondansetron  (ZOFRAN ) 8 MG tablet, Take 1 tablet (8 mg total) by mouth every 8 (eight) hours as needed for nausea or vomiting., Disp: 30 tablet, Rfl: 1   dutasteride (AVODART) 0.5 MG capsule, , Disp: , Rfl:    empagliflozin (JARDIANCE) 25 MG TABS tablet, Take 1 tablet by mouth daily., Disp: , Rfl:    famotidine (PEPCID) 10 MG tablet, Take 10 mg by mouth daily., Disp: , Rfl:    gabapentin (NEURONTIN) 300 MG capsule, Take by mouth., Disp: , Rfl:    ipratropium-albuterol (DUONEB) 0.5-2.5 (3) MG/3ML SOLN, Inhale into the lungs. (Patient not taking: Reported on 02/10/2024), Disp: , Rfl:    methocarbamol  (ROBAXIN ) 500 MG tablet, Take 1 tablet (500 mg total) by mouth 2 (two) times daily., Disp: 20 tablet, Rfl: 0   omeprazole  (PRILOSEC ) 40 MG capsule, Take 1 capsule (40 mg total) by mouth daily before breakfast., Disp: 30 capsule, Rfl: 0   oxyCODONE  (ROXICODONE ) 5 MG immediate release tablet, Take 1 tablet (5 mg total) by mouth every 8 (eight) hours as needed for severe pain (pain score 7-10) or moderate pain (pain score 4-6). (Patient not taking: Reported on 02/10/2024), Disp: 12 tablet, Rfl: 0   predniSONE  (STERAPRED UNI-PAK 21 TAB) 10 MG (21) TBPK tablet, Take by mouth daily. Take 6 tabs by mouth daily for 1, then 5 tabs for 1 day, then 4 tabs for 1 day, then 3 tabs for 1 day, then 2 tabs for 1 day, then 1 tab for 1 day. (Patient not taking: Reported on 12/29/2023), Disp: 21 tablet, Rfl: 0   pyridostigmine (MESTINON) 60 MG tablet, Take 1 tablet by mouth 2 (two) times daily., Disp: , Rfl:    scopolamine  (TRANSDERM-SCOP) 1 MG/3DAYS, Place onto the skin. (Patient not taking: Reported on 12/29/2023), Disp: , Rfl:    Semaglutide,0.25 or 0.5MG /DOS, 2 MG/3ML SOPN, Inject into the skin. (Patient not taking: Reported on 12/29/2023), Disp: , Rfl:    sitaGLIPtin (JANUVIA) 25 MG tablet, Take 25 mg by mouth., Disp: , Rfl:    tamsulosin  (FLOMAX) 0.4 MG CAPS capsule, Take by mouth. (Patient not taking: Reported on 12/29/2023), Disp: , Rfl:    triamcinolone cream (KENALOG) 0.1 %, APPLY SMALL AMOUNT TOPICALLY TWO TIMES A DAY AS NEEDED FOR  RED RAISED ITCHY RASH. STOP WHEN SMOOTH. AVOID FACE, ARMPIT, AND GROIN (Patient not taking: Reported on 12/29/2023), Disp: , Rfl:   Physical exam:  Vitals:   02/10/24 1000  BP: 115/67  Pulse: 78  Resp: 19  Temp: (!) 96.6 F (35.9 C)  TempSrc: Tympanic  SpO2: 98%  Weight: 209 lb 1.6 oz (94.8 kg)  Height: 5' 9 (1.753 m)   Physical Exam Cardiovascular:     Rate and Rhythm: Normal rate and regular rhythm.     Heart sounds: Normal heart sounds.  Pulmonary:     Effort: Pulmonary effort is normal.     Breath sounds: Normal breath sounds.  Skin:    General: Skin is warm and dry.  Neurological:     Mental Status: He is alert and oriented to person, place, and time.      I have personally reviewed labs listed below:    Latest Ref Rng & Units 12/29/2023    9:47 AM  CMP  Glucose 70 - 99 mg/dL 830   BUN 8 - 23 mg/dL 29   Creatinine 9.38 - 1.24 mg/dL 7.01   Sodium 864 - 854 mmol/L 137   Potassium 3.5 - 5.1 mmol/L 4.0   Chloride 98 - 111 mmol/L 101   CO2 22 - 32 mmol/L 28   Calcium  8.9 - 10.3 mg/dL 9.5   Total Protein 6.5 - 8.1 g/dL 6.8   Total Bilirubin 0.0 - 1.2 mg/dL 1.0   Alkaline Phos 38 - 126 U/L 79   AST 15 - 41 U/L 24   ALT 0 - 44 U/L 27       Latest Ref Rng & Units 02/10/2024    9:36 AM  CBC  WBC 4.0 - 10.5 K/uL 6.9   Hemoglobin 13.0 - 17.0 g/dL 82.0   Hematocrit 60.9 - 52.0 % 53.3   Platelets 150 - 400 K/uL 153      Assessment and plan- Patient is a 78 y.o. male here for routine follow-up of secondary polycythemia  Patient's hemoglobin has been around 17-18 since 2022.  No new medications were started at that time.  Workup for primary polycythemia vera including JAK2, CALR, MPL mutations negative.  He also had a bone marrow biopsy which did not show any evidence of  myeloproliferative disorder.  NGS testing on the bone marrow was also unremarkable.  He therefore does not have primary polycythemia.  Secondary polycythemia probably secondary to obstructive sleep apnea versus other unclear etiology.  Regardless patient does not have to undergo phlebotomy unless hematocrit more than 55.  CBC in 4 months in 8 months and I will see him back in 8 months.  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  I will continue to follow him up for 1 more year for his lymphoma   Visit Diagnosis 1. Polycythemia, secondary      Dr. Annah Skene, MD, MPH [facility] [phone] 02/10/2024 12:47 PM

## 2024-02-11 LAB — ERYTHROPOIETIN: Erythropoietin: 11.2 m[IU]/mL (ref 2.6–18.5)

## 2024-05-17 DIAGNOSIS — N184 Chronic kidney disease, stage 4 (severe): Secondary | ICD-10-CM | POA: Diagnosis not present

## 2024-05-17 DIAGNOSIS — I129 Hypertensive chronic kidney disease with stage 1 through stage 4 chronic kidney disease, or unspecified chronic kidney disease: Secondary | ICD-10-CM | POA: Diagnosis not present

## 2024-05-17 DIAGNOSIS — E039 Hypothyroidism, unspecified: Secondary | ICD-10-CM | POA: Diagnosis not present

## 2024-05-17 DIAGNOSIS — E7800 Pure hypercholesterolemia, unspecified: Secondary | ICD-10-CM | POA: Diagnosis not present

## 2024-05-17 DIAGNOSIS — E118 Type 2 diabetes mellitus with unspecified complications: Secondary | ICD-10-CM | POA: Diagnosis not present

## 2024-05-24 DIAGNOSIS — N052 Unspecified nephritic syndrome with diffuse membranous glomerulonephritis: Secondary | ICD-10-CM | POA: Diagnosis not present

## 2024-05-24 DIAGNOSIS — E7800 Pure hypercholesterolemia, unspecified: Secondary | ICD-10-CM | POA: Diagnosis not present

## 2024-05-24 DIAGNOSIS — I129 Hypertensive chronic kidney disease with stage 1 through stage 4 chronic kidney disease, or unspecified chronic kidney disease: Secondary | ICD-10-CM | POA: Diagnosis not present

## 2024-05-24 DIAGNOSIS — E039 Hypothyroidism, unspecified: Secondary | ICD-10-CM | POA: Diagnosis not present

## 2024-05-24 DIAGNOSIS — C884 Extranodal marginal zone b-cell lymphoma of mucosa-associated lymphoid tissue (malt-lymphoma) not having achieved remission: Secondary | ICD-10-CM | POA: Diagnosis not present

## 2024-05-24 DIAGNOSIS — G7000 Myasthenia gravis without (acute) exacerbation: Secondary | ICD-10-CM | POA: Diagnosis not present

## 2024-05-24 DIAGNOSIS — E118 Type 2 diabetes mellitus with unspecified complications: Secondary | ICD-10-CM | POA: Diagnosis not present

## 2024-05-24 DIAGNOSIS — N184 Chronic kidney disease, stage 4 (severe): Secondary | ICD-10-CM | POA: Diagnosis not present

## 2024-06-11 ENCOUNTER — Other Ambulatory Visit

## 2024-06-11 ENCOUNTER — Encounter

## 2024-06-14 ENCOUNTER — Inpatient Hospital Stay

## 2024-06-14 ENCOUNTER — Inpatient Hospital Stay: Attending: Oncology

## 2024-06-14 DIAGNOSIS — D751 Secondary polycythemia: Secondary | ICD-10-CM | POA: Diagnosis present

## 2024-06-14 DIAGNOSIS — Z79899 Other long term (current) drug therapy: Secondary | ICD-10-CM | POA: Insufficient documentation

## 2024-06-14 LAB — CBC (CANCER CENTER ONLY)
HCT: 52.6 % — ABNORMAL HIGH (ref 39.0–52.0)
Hemoglobin: 17.6 g/dL — ABNORMAL HIGH (ref 13.0–17.0)
MCH: 32.5 pg (ref 26.0–34.0)
MCHC: 33.5 g/dL (ref 30.0–36.0)
MCV: 97 fL (ref 80.0–100.0)
Platelet Count: 118 K/uL — ABNORMAL LOW (ref 150–400)
RBC: 5.42 MIL/uL (ref 4.22–5.81)
RDW: 13.3 % (ref 11.5–15.5)
WBC Count: 10.4 K/uL (ref 4.0–10.5)
nRBC: 0 % (ref 0.0–0.2)

## 2024-06-14 NOTE — Progress Notes (Signed)
No phlebotomy today.

## 2024-10-12 ENCOUNTER — Ambulatory Visit: Admitting: Oncology

## 2024-10-12 ENCOUNTER — Encounter

## 2024-10-12 ENCOUNTER — Other Ambulatory Visit
